# Patient Record
Sex: Female | Born: 1975
Health system: Southern US, Community
[De-identification: ages and names within clinical notes are randomized; demographics above are authoritative.]

## PROBLEM LIST (undated history)

## (undated) DIAGNOSIS — K648 Other hemorrhoids: Secondary | ICD-10-CM

## (undated) DIAGNOSIS — M545 Low back pain, unspecified: Secondary | ICD-10-CM

## (undated) DIAGNOSIS — D500 Iron deficiency anemia secondary to blood loss (chronic): Secondary | ICD-10-CM

## (undated) DIAGNOSIS — F988 Other specified behavioral and emotional disorders with onset usually occurring in childhood and adolescence: Secondary | ICD-10-CM

## (undated) DIAGNOSIS — E559 Vitamin D deficiency, unspecified: Secondary | ICD-10-CM

## (undated) DIAGNOSIS — Z8742 Personal history of other diseases of the female genital tract: Secondary | ICD-10-CM

## (undated) DIAGNOSIS — U071 COVID-19: Secondary | ICD-10-CM

## (undated) DIAGNOSIS — K5909 Other constipation: Secondary | ICD-10-CM

## (undated) DIAGNOSIS — B009 Herpesviral infection, unspecified: Secondary | ICD-10-CM

## (undated) DIAGNOSIS — N943 Premenstrual tension syndrome: Secondary | ICD-10-CM

## (undated) DIAGNOSIS — D649 Anemia, unspecified: Secondary | ICD-10-CM

## (undated) HISTORY — DX: Anemia, unspecified: D64.9

## (undated) HISTORY — DX: Low back pain, unspecified: M54.50

## (undated) HISTORY — DX: Vitamin D deficiency, unspecified: E55.9

## (undated) HISTORY — DX: Other constipation: K59.09

## (undated) HISTORY — DX: Other hemorrhoids: K64.8

## (undated) HISTORY — PX: TUBAL LIGATION: SHX77

## (undated) HISTORY — PX: ENDOMETRIAL BIOPSY: SHX622

## (undated) HISTORY — DX: Other specified behavioral and emotional disorders with onset usually occurring in childhood and adolescence: F98.8

## (undated) HISTORY — DX: Premenstrual tension syndrome: N94.3

## (undated) HISTORY — DX: Iron deficiency anemia secondary to blood loss (chronic): D50.0

## (undated) HISTORY — DX: COVID-19: U07.1

## (undated) HISTORY — DX: Personal history of other diseases of the female genital tract: Z87.42

## (undated) HISTORY — PX: OVARIAN CYST REMOVAL: SHX89

## (undated) HISTORY — DX: Herpesviral infection, unspecified: B00.9

## (undated) HISTORY — PX: NOVASURE ABLATION: SHX5394

## (undated) HISTORY — DX: Low back pain: M54.5

## (undated) HISTORY — PX: WISDOM TOOTH EXTRACTION: SHX21

---

## 2007-03-01 ENCOUNTER — Ambulatory Visit: Payer: Self-pay | Admitting: Gastroenterology

## 2007-07-10 ENCOUNTER — Emergency Department: Payer: Self-pay | Admitting: Emergency Medicine

## 2008-04-06 ENCOUNTER — Emergency Department: Payer: Self-pay | Admitting: Emergency Medicine

## 2009-04-18 ENCOUNTER — Ambulatory Visit: Payer: Self-pay | Admitting: Internal Medicine

## 2009-04-21 ENCOUNTER — Ambulatory Visit: Payer: Self-pay | Admitting: Family Medicine

## 2009-04-29 ENCOUNTER — Ambulatory Visit: Payer: Self-pay | Admitting: Internal Medicine

## 2009-05-19 ENCOUNTER — Ambulatory Visit: Payer: Self-pay | Admitting: Internal Medicine

## 2009-06-18 ENCOUNTER — Ambulatory Visit: Payer: Self-pay | Admitting: Internal Medicine

## 2009-07-19 ENCOUNTER — Ambulatory Visit: Payer: Self-pay | Admitting: Internal Medicine

## 2009-08-19 ENCOUNTER — Ambulatory Visit: Payer: Self-pay | Admitting: Internal Medicine

## 2009-08-27 ENCOUNTER — Ambulatory Visit: Payer: Self-pay | Admitting: Internal Medicine

## 2009-09-09 ENCOUNTER — Ambulatory Visit: Payer: Self-pay | Admitting: Family Medicine

## 2009-09-09 LAB — HM MAMMOGRAPHY: HM Mammogram: ABNORMAL

## 2009-09-17 ENCOUNTER — Ambulatory Visit: Payer: Self-pay | Admitting: Family Medicine

## 2009-09-18 ENCOUNTER — Ambulatory Visit: Payer: Self-pay | Admitting: Internal Medicine

## 2009-10-19 ENCOUNTER — Ambulatory Visit: Payer: Self-pay | Admitting: Internal Medicine

## 2009-11-17 ENCOUNTER — Ambulatory Visit: Payer: Self-pay | Admitting: Internal Medicine

## 2009-11-18 ENCOUNTER — Ambulatory Visit: Payer: Self-pay | Admitting: Internal Medicine

## 2009-12-19 ENCOUNTER — Ambulatory Visit: Payer: Self-pay | Admitting: Internal Medicine

## 2010-01-01 ENCOUNTER — Ambulatory Visit: Payer: Self-pay | Admitting: Internal Medicine

## 2010-01-19 ENCOUNTER — Ambulatory Visit: Payer: Self-pay | Admitting: Internal Medicine

## 2010-02-16 ENCOUNTER — Ambulatory Visit: Payer: Self-pay | Admitting: Internal Medicine

## 2010-02-26 ENCOUNTER — Ambulatory Visit: Payer: Self-pay | Admitting: Internal Medicine

## 2010-04-18 DIAGNOSIS — Z8742 Personal history of other diseases of the female genital tract: Secondary | ICD-10-CM

## 2010-04-18 HISTORY — DX: Personal history of other diseases of the female genital tract: Z87.42

## 2010-04-21 ENCOUNTER — Inpatient Hospital Stay: Payer: Self-pay | Admitting: Obstetrics and Gynecology

## 2010-07-19 ENCOUNTER — Ambulatory Visit: Payer: Self-pay | Admitting: Internal Medicine

## 2011-05-30 ENCOUNTER — Ambulatory Visit: Payer: Self-pay | Admitting: Family Medicine

## 2011-07-05 ENCOUNTER — Emergency Department: Payer: Self-pay | Admitting: Emergency Medicine

## 2011-11-15 ENCOUNTER — Encounter: Payer: Self-pay | Admitting: Obstetrics and Gynecology

## 2011-11-15 ENCOUNTER — Ambulatory Visit (INDEPENDENT_AMBULATORY_CARE_PROVIDER_SITE_OTHER): Payer: BC Managed Care – PPO | Admitting: Obstetrics and Gynecology

## 2011-11-15 VITALS — BP 117/74 | HR 71 | Ht 65.0 in | Wt 207.0 lb

## 2011-11-15 DIAGNOSIS — N83209 Unspecified ovarian cyst, unspecified side: Secondary | ICD-10-CM | POA: Insufficient documentation

## 2011-11-15 DIAGNOSIS — N390 Urinary tract infection, site not specified: Secondary | ICD-10-CM

## 2011-11-15 DIAGNOSIS — R300 Dysuria: Secondary | ICD-10-CM | POA: Insufficient documentation

## 2011-11-15 LAB — POCT URINALYSIS DIPSTICK
Glucose, UA: NEGATIVE
Nitrite, UA: NEGATIVE
Spec Grav, UA: 1.025
Urobilinogen, UA: 0.2

## 2011-11-15 NOTE — Progress Notes (Signed)
Addended by: Vinnie Langton C on: 11/15/2011 03:39 PM   Modules accepted: Orders

## 2011-11-15 NOTE — Patient Instructions (Signed)

## 2011-11-15 NOTE — Progress Notes (Signed)
35 yo presenting today for evaluation of a 1-week h/o urgency and difficulty voiding. Patient states that at time she also experiences dysuria. Patient also reports having a long standing h/o recurrent ovarian cyst for which she is medically treated with OCP. Patient reports starting her OCP in August but feels that every month she has a ruptured cyst as she has pelvic pain and difficulty ambulating as a result of her pain. Patient is scheduled to follow-up with her Ob/Gyn in a few months.  Urine culture collected. Patient will be contacted with any abnormal results. Patient to follow-up as planned with her Ob/Gyn for ovarian cyst

## 2012-02-01 ENCOUNTER — Ambulatory Visit: Payer: Self-pay | Admitting: Family Medicine

## 2013-05-19 LAB — HM PAP SMEAR: HM PAP: NORMAL

## 2013-10-02 DIAGNOSIS — M7700 Medial epicondylitis, unspecified elbow: Secondary | ICD-10-CM | POA: Insufficient documentation

## 2013-10-02 DIAGNOSIS — G5620 Lesion of ulnar nerve, unspecified upper limb: Secondary | ICD-10-CM | POA: Insufficient documentation

## 2013-10-02 DIAGNOSIS — M7540 Impingement syndrome of unspecified shoulder: Secondary | ICD-10-CM | POA: Insufficient documentation

## 2014-05-28 LAB — LIPID PANEL
Cholesterol: 169 mg/dL (ref 0–200)
HDL: 45 mg/dL (ref 35–70)
LDL Cholesterol: 115 mg/dL
TRIGLYCERIDES: 45 mg/dL (ref 40–160)

## 2014-10-20 ENCOUNTER — Encounter: Payer: Self-pay | Admitting: Obstetrics and Gynecology

## 2015-04-14 DIAGNOSIS — M5412 Radiculopathy, cervical region: Secondary | ICD-10-CM | POA: Insufficient documentation

## 2015-05-20 ENCOUNTER — Encounter: Payer: Self-pay | Admitting: Family Medicine

## 2015-05-20 ENCOUNTER — Ambulatory Visit (INDEPENDENT_AMBULATORY_CARE_PROVIDER_SITE_OTHER): Payer: 59 | Admitting: Family Medicine

## 2015-05-20 VITALS — BP 126/68 | HR 89 | Temp 98.2°F | Resp 18 | Ht 65.5 in | Wt 223.6 lb

## 2015-05-20 DIAGNOSIS — M792 Neuralgia and neuritis, unspecified: Secondary | ICD-10-CM

## 2015-05-20 DIAGNOSIS — J302 Other seasonal allergic rhinitis: Secondary | ICD-10-CM

## 2015-05-20 DIAGNOSIS — M5410 Radiculopathy, site unspecified: Secondary | ICD-10-CM

## 2015-05-20 NOTE — Patient Instructions (Signed)
Place neck pain patient instructions here. She has lost weight since last visit, and was advised to keep it up Reminded her of the importance of Place neck pain patient instructions here. continue to eat when taking prednisone to avoid GI bleed

## 2015-05-20 NOTE — Progress Notes (Signed)
Name: Emily Eaton   MRN: [id number]    DOB: [birth year]   Date:05/20/2015       Progress Note  Subjective  Chief Complaint  Chief Complaint  Patient presents with  . Arm Pain    Right    HPI  She was seen in our office on April 26th, 2016 for right arm radiculitis, that could be from neck or shoulder. She was given prednisone taper and was kept off work for the past week, she still has mild paresthesia on tip of 2nd through 4th fingers, but no longer has pain and is ready to go back to work    Past Medical History  Diagnosis Date  . Anemia   . History of ovarian cyst 04/2010    History  Substance Use Topics  . Smoking status: Never Smoker   . Smokeless tobacco: Not on file  . Alcohol Use: 1.2 oz/week    2 Standard drinks or equivalent per week     Comment: occassion     Current outpatient prescriptions:  .  fluticasone (FLONASE) 50 MCG/ACT nasal spray, Place 2 sprays into the nose., Disp: , Rfl:  .  pseudoephedrine (SUDAFED) 30 MG tablet, Take 2 capsules by mouth as needed., Disp: , Rfl:  .  cholecalciferol (VITAMIN D) 1000 UNITS tablet, Take 1,000 Units by mouth daily.  , Disp: , Rfl:  .  norethindrone-ethinyl estradiol-iron (ESTROSTEP FE,TILIA FE,TRI-LEGEST FE) 1-20/1-30/1-35 MG-MCG tablet, Take 1 tablet by mouth daily.  , Disp: , Rfl:   Allergies  Allergen Reactions  . Codeine Itching and Rash    ROS  Ten systems reviewed and is negative except as mentioned in HPI   Objective  Filed Vitals:   05/20/15 1013  BP: 126/68  Pulse: 89  Temp: 98.2 F (36.8 C)  TempSrc: Oral  Resp: 18  Height: 5' 5.5" (1.664 m)  Weight: 223 lb 9.6 oz (101.424 kg)  SpO2: 99%     Physical Exam  Constitutional: Patient appears well-developed and well-nourished. No distress.  HENT: Head: Normocephalic and atraumatic.  Nose: Nose normal. Mouth/Throat: Oropharynx is clear and moist. No oropharyngeal exudate.  Eyes: Conjunctivae and EOM are normal. Pupils are equal, round, and  reactive to light. No scleral icterus.  Neck: Normal range of motion. Neck supple. No point tenderness during exam No JVD present. No thyromegaly present.  Cardiovascular: Normal rate, regular rhythm and normal heart sounds.  No murmur heard. No BLE edema. Pulmonary/Chest: Effort normal and breath sounds normal. No respiratory distress. Musculoskeletal: Normal range of motion, no joint effusions. No gross deformities Neurological: he is alert and oriented to person, place, and time.  Coordination, balance, strength, speech and gait are normal.  Skin: Skin is warm and dry. No rash noted. No erythema.  Psychiatric: Patient has a normal mood and affect. behavior is normal. Judgment and thought content normal.    Assessment & Plan  1. Radiculitis She is still having some paresthesia, has not finished the prednisone taper, no longer has shoulder or neck pain, and is ready to go back to work. Explained that if returns we will check MRI neck, may also benefit from PT. FMLA forms filled out during today's visit

## 2015-07-08 ENCOUNTER — Telehealth: Payer: Self-pay | Admitting: Family Medicine

## 2015-07-08 NOTE — Telephone Encounter (Signed)
Please advise 

## 2015-07-08 NOTE — Telephone Encounter (Signed)
Pt would like to know if you could give her prednisone for her hand again. She states the pain is back in her hand. Pt has an appt on 07/28/15. [place]

## 2015-07-08 NOTE — Telephone Encounter (Signed)
She needs to be seen.

## 2015-07-09 ENCOUNTER — Other Ambulatory Visit: Payer: Self-pay | Admitting: Family Medicine

## 2015-07-09 DIAGNOSIS — M5412 Radiculopathy, cervical region: Secondary | ICD-10-CM

## 2015-07-09 NOTE — Telephone Encounter (Signed)
Pt states she will wait for her appt in August.

## 2015-07-25 ENCOUNTER — Encounter: Payer: Self-pay | Admitting: Family Medicine

## 2015-07-25 DIAGNOSIS — K5909 Other constipation: Secondary | ICD-10-CM | POA: Insufficient documentation

## 2015-07-25 DIAGNOSIS — B009 Herpesviral infection, unspecified: Secondary | ICD-10-CM | POA: Insufficient documentation

## 2015-07-25 DIAGNOSIS — E669 Obesity, unspecified: Secondary | ICD-10-CM | POA: Insufficient documentation

## 2015-07-25 DIAGNOSIS — F900 Attention-deficit hyperactivity disorder, predominantly inattentive type: Secondary | ICD-10-CM | POA: Insufficient documentation

## 2015-07-25 DIAGNOSIS — D509 Iron deficiency anemia, unspecified: Secondary | ICD-10-CM | POA: Insufficient documentation

## 2015-07-25 DIAGNOSIS — K648 Other hemorrhoids: Secondary | ICD-10-CM | POA: Insufficient documentation

## 2015-07-25 DIAGNOSIS — M79676 Pain in unspecified toe(s): Secondary | ICD-10-CM | POA: Insufficient documentation

## 2015-07-25 DIAGNOSIS — E559 Vitamin D deficiency, unspecified: Secondary | ICD-10-CM | POA: Insufficient documentation

## 2015-07-25 DIAGNOSIS — K5904 Chronic idiopathic constipation: Secondary | ICD-10-CM | POA: Insufficient documentation

## 2015-07-28 ENCOUNTER — Ambulatory Visit (INDEPENDENT_AMBULATORY_CARE_PROVIDER_SITE_OTHER): Payer: 59 | Admitting: Family Medicine

## 2015-07-28 ENCOUNTER — Encounter: Payer: Self-pay | Admitting: Family Medicine

## 2015-07-28 VITALS — BP 116/68 | HR 76 | Temp 98.5°F | Resp 18 | Ht 66.0 in | Wt 226.4 lb

## 2015-07-28 DIAGNOSIS — M25641 Stiffness of right hand, not elsewhere classified: Secondary | ICD-10-CM | POA: Insufficient documentation

## 2015-07-28 DIAGNOSIS — Z0000 Encounter for general adult medical examination without abnormal findings: Secondary | ICD-10-CM | POA: Diagnosis not present

## 2015-07-28 DIAGNOSIS — Z01419 Encounter for gynecological examination (general) (routine) without abnormal findings: Secondary | ICD-10-CM

## 2015-07-28 DIAGNOSIS — Z13220 Encounter for screening for lipoid disorders: Secondary | ICD-10-CM

## 2015-07-28 DIAGNOSIS — Z1239 Encounter for other screening for malignant neoplasm of breast: Secondary | ICD-10-CM | POA: Diagnosis not present

## 2015-07-28 DIAGNOSIS — E559 Vitamin D deficiency, unspecified: Secondary | ICD-10-CM | POA: Diagnosis not present

## 2015-07-28 DIAGNOSIS — Z79899 Other long term (current) drug therapy: Secondary | ICD-10-CM

## 2015-07-28 DIAGNOSIS — Z862 Personal history of diseases of the blood and blood-forming organs and certain disorders involving the immune mechanism: Secondary | ICD-10-CM | POA: Diagnosis not present

## 2015-07-28 DIAGNOSIS — Z124 Encounter for screening for malignant neoplasm of cervix: Secondary | ICD-10-CM | POA: Diagnosis not present

## 2015-07-28 NOTE — Progress Notes (Signed)
Name: Emily Eaton   MRN: [id number]    DOB: 1976-02-25   Date:07/28/2015       Progress Note  Subjective  Chief Complaint  Chief Complaint  Patient presents with  . Annual Exam    HPI  CPE: she is feeling well, except for right hand stiffness and pain, sometimes tingling and numbness, symptoms started in May 2016 and was seen here and treated for cervical radiculitis with prednisone taper , she felt better, but symptoms returned about 3 weeks ago, but different now, with more arm symptoms, neck is no longer hurting, pain is more in her hand with pain, stiffness.   Patient Active Problem List   Diagnosis Date Noted  . Stiffness of right hand joint 07/28/2015  . ADD (attention deficit hyperactivity disorder, inattentive type) 07/25/2015  . Pain in toe 07/25/2015  . Chronic constipation 07/25/2015  . Herpes 07/25/2015  . Hemorrhoids, internal 07/25/2015  . Obesity (BMI 30-39.9) 07/25/2015  . Vitamin D deficiency 07/25/2015  . Radiculitis of right cervical region 04/14/2015  . Epicondylitis elbow, medial 10/02/2013  . Entrapment of ulnar nerve 10/02/2013  . Impingement syndrome of shoulder 10/02/2013  . Ovarian cyst 11/15/2011    Past Surgical History  Procedure Laterality Date  . Ovarian cyst removal      left  . Tubal ligation    . Novasure ablation    . Wisdom tooth extraction      x 1  . Endometrial biopsy      Family History  Problem Relation Age of Onset  . Diabetes Mother   . Depression Mother   . Hypertension Mother   . Kidney disease Mother   . Hyperlipidemia Mother   . Hypertension Father   . Diabetes Father   . Liver disease Father   . Asthma Brother   . Cancer Maternal Uncle     prostate  . Asthma Son     History   Social History  . Marital Status: Married    Spouse Name: Dorthula Rue  . Number of Children: 2  . Years of Education: 14   Occupational History  . 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 Smoking status: Never Smoker   . Smokeless tobacco: Never Used  . Alcohol Use: 1.2 oz/week    2 Standard drinks or equivalent per week     Comment: occassion  . Drug Use: No  . Sexual Activity:    Partners: Male    Birth Control/ Protection: Surgical     Comment: Tubaligation   Other Topics Concern  . Not on file   Social History Narrative     Current outpatient prescriptions:  .  Naproxen Sodium (ALEVE) 220 MG CAPS, Take 1 capsule by mouth 2 (two) times daily as needed., Disp: , Rfl:  .  pseudoephedrine (SUDAFED) 30 MG tablet, Take 2 capsules by mouth as needed., Disp: , Rfl:   Allergies  Allergen Reactions  . Codeine Itching and Rash     ROS  Constitutional: Negative for fever or weight change.  Respiratory: Negative for cough and shortness of breath.   Cardiovascular: Negative for chest pain or palpitations.  Gastrointestinal: Negative for abdominal pain, no bowel changes.  Musculoskeletal: Negative for gait problem positive for joint swelling- right hand  Skin: Negative for rash.  Neurological: Negative for dizziness or headache.  No other specific complaints in a complete review of systems (except as listed  in HPI above).  Objective  Filed Vitals:   07/28/15 1142  BP: 116/68  Pulse: 76  Temp: 98.5 F (36.9 C)  TempSrc: Oral  Resp: 18  Height: '5\' 6"'  (1.676 m)  Weight: 226 lb 6.4 oz (102.694 kg)  SpO2: 96%    Body mass index is 36.56 kg/(m^2).  Physical Exam  Constitutional: Patient appears well-developed and well-nourished. No distress. Obese HENT: Head: Normocephalic and atraumatic. Ears: B TMs ok, no erythema or effusion; Nose: Nose normal. Mouth/Throat: Oropharynx is clear and moist. No oropharyngeal exudate.  Eyes: Conjunctivae and EOM are normal. Pupils are equal, round, and reactive to light. No scleral icterus.  Neck: Normal range of motion. Neck supple. No JVD present. No thyromegaly present.  Cardiovascular: Normal rate, regular  rhythm and normal heart sounds.  No murmur heard. No BLE edema. Pulmonary/Chest: Effort normal and breath sounds normal. No respiratory distress. Abdominal: Soft. Bowel sounds are normal, no distension. There is no tenderness. no masses Breast: no lumps or masses, no nipple discharge or rashes FEMALE GENITALIA:  External genitalia normal External urethra normal Vaginal vault normal without discharge or lesions Cervix normal without discharge or lesions Bimanual exam normal without masses RECTAL: not done Musculoskeletal: pain with movement of right hand, mild effusion, no increase in warmth or erythema.  No gross deformities Neurological: he is alert and oriented to person, place, and time. No cranial nerve deficit. Coordination, balance, strength, speech and gait are normal.  Skin: Skin is warm and dry. No rash noted. No erythema.  Psychiatric: Patient has a normal mood and affect. behavior is normal. Judgment and thought content normal.  PHQ2/9: Depression screen [facility] 2/9 07/28/2015 05/20/2015  Decreased Interest 0 0  Down, Depressed, Hopeless 0 0  PHQ - 2 Score 0 0     Fall Risk: Fall Risk  07/28/2015 05/20/2015  Falls in the past year? No No      Assessment & Plan  1. Well woman exam Discussed importance of 150 minutes of physical activity weekly, eat two servings of fish weekly, eat one serving of tree nuts ( cashews, pistachios, pecans, almonds.Marland Kitchen) every other day, eat 6 servings of fruit/vegetables daily and drink plenty of water and avoid sweet beverages.   2. Cervical cancer screening  - Cytology - PAP  3. Breast cancer screening  - MM Digital Screening; Future  4. Stiffness of right hand joint We will check sed rate, but if no improvement will need NCS of right upper extremity and possible referral to Ortho since history of right shoulder impingement symptoms - Sed Rate (ESR)  5. Lipid screening  - Lipid Profile  6. Vitamin D deficiency  - Vitamin D (25  hydroxy)  7. Long-term use of high-risk medication  - Comprehensive Metabolic Panel (CMET)  8. History of iron deficiency anemia  - Hematocrit

## 2015-07-29 ENCOUNTER — Other Ambulatory Visit: Payer: Self-pay | Admitting: Family Medicine

## 2015-07-29 ENCOUNTER — Telehealth: Payer: Self-pay

## 2015-07-29 LAB — LIPID PANEL
Chol/HDL Ratio: 4.2 ratio units (ref 0.0–4.4)
Cholesterol, Total: 173 mg/dL (ref 100–199)
HDL: 41 mg/dL (ref >39)
LDL Calculated: 121 mg/dL — ABNORMAL HIGH (ref 0–99)
Triglycerides: 53 mg/dL (ref 0–149)
VLDL Cholesterol Cal: 11 mg/dL (ref 5–40)

## 2015-07-29 LAB — COMPREHENSIVE METABOLIC PANEL
ALT: 6 IU/L (ref 0–32)
AST: 8 IU/L (ref 0–40)
Albumin/Globulin Ratio: 1.2 (ref 1.1–2.5)
Albumin: 3.8 g/dL (ref 3.5–5.5)
Alkaline Phosphatase: 62 IU/L (ref 39–117)
BUN/Creatinine Ratio: 12 (ref 9–23)
BUN: 8 mg/dL (ref 6–24)
Bilirubin Total: 0.3 mg/dL (ref 0.0–1.2)
CALCIUM: 8.9 mg/dL (ref 8.7–10.2)
CHLORIDE: 104 mmol/L (ref 97–108)
CO2: 22 mmol/L (ref 18–29)
Creatinine, Ser: 0.69 mg/dL (ref 0.57–1.00)
GFR calc Af Amer: 125 mL/min/{1.73_m2} (ref >59)
GFR calc non Af Amer: 108 mL/min/{1.73_m2} (ref >59)
GLOBULIN, TOTAL: 3.3 g/dL (ref 1.5–4.5)
Glucose: 84 mg/dL (ref 65–99)
Potassium: 4 mmol/L (ref 3.5–5.2)
SODIUM: 141 mmol/L (ref 134–144)
Total Protein: 7.1 g/dL (ref 6.0–8.5)

## 2015-07-29 LAB — HEMATOCRIT: HEMATOCRIT: 36.4 % (ref 34.0–46.6)

## 2015-07-29 LAB — SEDIMENTATION RATE: SED RATE: 24 mm/h (ref 0–32)

## 2015-07-29 LAB — VITAMIN D 25 HYDROXY (VIT D DEFICIENCY, FRACTURES): Vit D, 25-Hydroxy: 11.6 ng/mL — ABNORMAL LOW (ref 30.0–100.0)

## 2015-07-29 MED ORDER — VITAMIN D (ERGOCALCIFEROL) 1.25 MG (50000 UNIT) PO CAPS: 50000.0000 [IU] | ORAL_CAPSULE | ORAL | Status: DC

## 2015-07-29 NOTE — Telephone Encounter (Signed)
Left vm for patient to return my call regarding lab results.

## 2015-07-29 NOTE — Telephone Encounter (Signed)
-----   Message from Steele Sizer, MD sent at 07/29/2015 12:26 PM EDT ----- Lipid panel is fine Sugar , kidney and liver functions are within normal limits No anemia Normal sed rate Vitamin D is low, I will send prescription vitamin D to  pharmacy and once finished she/he needs to take otc vitamin D 2000 units daily Please notify patient, thank you

## 2015-07-30 NOTE — Progress Notes (Signed)
Patient notified

## 2015-09-08 ENCOUNTER — Ambulatory Visit: Payer: 59 | Admitting: Family Medicine

## 2015-12-11 ENCOUNTER — Ambulatory Visit (INDEPENDENT_AMBULATORY_CARE_PROVIDER_SITE_OTHER): Payer: 59 | Admitting: Family Medicine

## 2015-12-11 ENCOUNTER — Encounter: Payer: Self-pay | Admitting: Family Medicine

## 2015-12-11 VITALS — BP 132/88 | HR 102 | Temp 98.5°F | Resp 18 | Ht 66.0 in | Wt 220.4 lb

## 2015-12-11 DIAGNOSIS — Z113 Encounter for screening for infections with a predominantly sexual mode of transmission: Secondary | ICD-10-CM

## 2015-12-11 DIAGNOSIS — Z23 Encounter for immunization: Secondary | ICD-10-CM

## 2015-12-11 DIAGNOSIS — J069 Acute upper respiratory infection, unspecified: Secondary | ICD-10-CM | POA: Diagnosis not present

## 2015-12-11 DIAGNOSIS — R350 Frequency of micturition: Secondary | ICD-10-CM

## 2015-12-11 DIAGNOSIS — A599 Trichomoniasis, unspecified: Secondary | ICD-10-CM

## 2015-12-11 DIAGNOSIS — N760 Acute vaginitis: Secondary | ICD-10-CM | POA: Diagnosis not present

## 2015-12-11 LAB — POCT WET PREP (WET MOUNT)

## 2015-12-11 LAB — POCT URINALYSIS DIPSTICK
Bilirubin, UA: NEGATIVE
Blood, UA: NEGATIVE
GLUCOSE UA: NEGATIVE
Ketones, UA: NEGATIVE
LEUKOCYTES UA: NEGATIVE
Nitrite, UA: NEGATIVE
SPEC GRAV UA: 1.025
UROBILINOGEN UA: NEGATIVE
pH, UA: 5

## 2015-12-11 MED ORDER — METRONIDAZOLE 500 MG PO TABS: 2000.0000 mg | ORAL_TABLET | Freq: Once | ORAL | Status: DC

## 2015-12-11 MED ORDER — HYDROCOD POLST-CPM POLST ER 10-8 MG/5ML PO SUER: 5.0000 mL | Freq: Two times a day (BID) | ORAL | Status: DC | PRN

## 2015-12-11 MED ORDER — FLUTICASONE PROPIONATE 50 MCG/ACT NA SUSP: 2.0000 | Freq: Every day | NASAL | Status: DC

## 2015-12-11 MED ORDER — DOXYCYCLINE HYCLATE 100 MG PO TABS: 100.0000 mg | ORAL_TABLET | Freq: Two times a day (BID) | ORAL | Status: DC

## 2015-12-11 MED ORDER — FLUTICASONE FUROATE-VILANTEROL 100-25 MCG/INH IN AEPB: 1.0000 | INHALATION_SPRAY | Freq: Every day | RESPIRATORY_TRACT | Status: DC

## 2015-12-11 NOTE — Progress Notes (Signed)
Name: Emily Eaton   MRN: [id number]    DOB: 1976/05/04   Date:12/11/2015       Progress Note  Subjective  Chief Complaint  Chief Complaint  Patient presents with  . URI    Onset last Wednesday, Cough-Green Phlegm, Nasal Congestion, Epistaxis, Sinus Pain/Pressure and symptoms getting worst. Has taken Sudafed, Mucinex, Flonase, Ibuprofen, cough drops with no relief.  . Vaginal Discharge    Onset-1 week, yellow to brown discharge, vaginal itch, odor, urinary frequency.     HPI  URI: symptoms started 9 days ago. Initially with sore throat, rhinorrhea, nasal congestion, followed by facial pressure, cough that is productive in am's, hoarseness, chest congestion, no fever, no chills, normal appetite. Most of the symptoms with otc medication but the cough is terrible.   Vaginal Discharge: started about one week ago, described as yellowish/brown, vaginal itching, odor, she also has dysuria ( past month with nocturia ), she does not trust her husband. Worried about STI  Pruritus: bilateral inner thighs, no rashes, going on for a few weeks now.   Patient Active Problem List   Diagnosis Date Noted  . Stiffness of right hand joint 07/28/2015  . ADD (attention deficit hyperactivity disorder, inattentive type) 07/25/2015  . Pain in toe 07/25/2015  . Chronic constipation 07/25/2015  . Herpes 07/25/2015  . Hemorrhoids, internal 07/25/2015  . Obesity (BMI 30-39.9) 07/25/2015  . Vitamin D deficiency 07/25/2015  . Radiculitis of right cervical region 04/14/2015  . Epicondylitis elbow, medial 10/02/2013  . Entrapment of ulnar nerve 10/02/2013  . Impingement syndrome of shoulder 10/02/2013  . Ovarian cyst 11/15/2011    Past Surgical History  Procedure Laterality Date  . Ovarian cyst removal      left  . Tubal ligation    . Novasure ablation    . Wisdom tooth extraction      x 1  . Endometrial biopsy      Family History  Problem Relation Age of Onset  . Diabetes Mother   .  Depression Mother   . Hypertension Mother   . Kidney disease Mother   . Hyperlipidemia Mother   . Hypertension Father   . Diabetes Father   . Liver disease Father   . Asthma Brother   . Cancer Maternal Uncle     prostate  . Asthma Son     Social History   Social History  . Marital Status: Married    Spouse Name: Dorthula Rue  . Number of Children: 2  . 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 Smoking status: Never Smoker   . Smokeless tobacco: Never Used  . Alcohol Use: 1.2 oz/week    2 Standard drinks or equivalent per week     Comment: occassionally  . Drug Use: No  . Sexual Activity:    Partners: Male    Birth Control/ Protection: Surgical     Comment: Tubaligation   Other Topics Concern  . Not on file   Social History Narrative     Current outpatient prescriptions:  .  cholecalciferol (VITAMIN D) 1000 UNITS tablet, Take 2,000 Units by mouth once., Disp: , Rfl:  .  pseudoephedrine (SUDAFED) 30 MG tablet, Take 2 capsules by mouth as needed., Disp: , Rfl:  .  chlorpheniramine-HYDROcodone (TUSSIONEX PENNKINETIC ER) 10-8 MG/5ML SUER, Take 5 mLs by mouth every 12 (twelve) hours as needed  for cough., Disp: 140 mL, Rfl: 0 .  fluticasone (FLONASE) 50 MCG/ACT nasal spray, Place 2 sprays into both nostrils daily., Disp: 16 g, Rfl: 0  Allergies  Allergen Reactions  . Codeine Itching and Rash     ROS  Ten systems reviewed and is negative except as mentioned in HPI   Objective  Filed Vitals:   12/11/15 1511  BP: 132/88  Pulse: 102  Temp: 98.5 F (36.9 C)  TempSrc: Oral  Resp: 18  Height: 5\' 6"  (1.676 m)  Weight: 220 lb 6.4 oz (99.973 kg)  SpO2: 99%    Body mass index is 35.59 kg/(m^2).  Physical Exam  Constitutional: Patient appears well-developed and well-nourished. Obese No distress.  HEENT: head atraumatic, normocephalic, pupils equal and reactive to  light, ears TM normal, no tenderness during palpation of sinus, neck supple, throat within normal limits, except for mild erythema, no postnasal drainage Cardiovascular: Normal rate, regular rhythm and normal heart sounds.  No murmur heard. No BLE edema. Pulmonary/Chest: Effort normal and breath sounds normal. No respiratory distress. Abdominal: Soft.  There is no tenderness. Psychiatric: Patient has a normal mood and affect. behavior is normal. Judgment and thought content normal. GU: mild vaginal discharge watery , cervix has some erythema, cervical os normal, bimanual exam, mild discomfort left lower quadrant but she wants to hold off on [place] Skin: inner thighs shows a rash like keratosis pilaris - likely from rubbing - advise body glide and weight loss   PHQ2/9: Depression screen [facility] 2/9 12/11/2015 07/28/2015 05/20/2015  Decreased Interest 0 0 0  Down, Depressed, Hopeless 0 0 0  PHQ - 2 Score 0 0 0    Fall Risk: Fall Risk  12/11/2015 07/28/2015 05/20/2015  Falls in the past year? Yes No No  Number falls in past yr: 1 - -  Injury with Fall? Yes - -    Functional Status Survey: Is the patient deaf or have difficulty hearing?: No Does the patient have difficulty seeing, even when wearing glasses/contacts?: No Does the patient have difficulty concentrating, remembering, or making decisions?: No Does the patient have difficulty walking or climbing stairs?: No Does the patient have difficulty dressing or bathing?: No Does the patient have difficulty doing errands alone such as visiting a doctor's office or shopping?: No   Assessment & Plan  1. Urinary frequency  - POCT Urinalysis Dipstick  2. Needs flu shot  - Flu Vaccine QUAD 36+ mos PF IM (Fluarix & Fluzone Quad PF) -refused  3. Upper respiratory infection  We will add Doxy, since she has UTI symptoms, possible chlamydia and URI  - chlorpheniramine-HYDROcodone (TUSSIONEX PENNKINETIC ER) 10-8 MG/5ML SUER; Take 5 mLs by mouth every  12 (twelve) hours as needed for cough.  Dispense: 140 mL; Refill: 0 - fluticasone (FLONASE) 50 MCG/ACT nasal spray; Place 2 sprays into both nostrils daily.  Dispense: 16 g; Refill: 0  4. Vaginitis  - POCT Wet Prep [employer] [place])  5. Routine screening for STI (sexually transmitted infection)  - RPR - HIV antibody - GC/chlamydia probe amp, urine   6. Trichomonal infection  Discussed transmission and that spouse also needs to be treated - metroNIDAZOLE (FLAGYL) 500 MG tablet; Take 4 tablets (2,000 mg total) by mouth once.  Dispense: 4 tablet; Refill: 0

## 2015-12-12 LAB — HIV ANTIBODY (ROUTINE TESTING W REFLEX): HIV Screen 4th Generation wRfx: NONREACTIVE

## 2015-12-12 LAB — RPR: RPR: NONREACTIVE

## 2015-12-15 ENCOUNTER — Other Ambulatory Visit: Payer: Self-pay | Admitting: Family Medicine

## 2015-12-17 ENCOUNTER — Telehealth: Payer: Self-pay

## 2015-12-17 LAB — PLEASE NOTE

## 2015-12-18 LAB — URINE CULTURE

## 2015-12-18 LAB — GC/CHLAMYDIA PROBE AMP
Chlamydia trachomatis, NAA: NEGATIVE
NEISSERIA GONORRHOEAE BY PCR: NEGATIVE

## 2015-12-18 LAB — SPECIMEN STATUS REPORT

## 2015-12-23 NOTE — Telephone Encounter (Signed)
Patient urinary symptoms have gone away with first antibiotic and does not want the new one.

## 2016-07-07 ENCOUNTER — Other Ambulatory Visit: Payer: Self-pay | Admitting: Family Medicine

## 2016-07-07 ENCOUNTER — Encounter: Payer: Self-pay | Admitting: Family Medicine

## 2016-07-07 ENCOUNTER — Ambulatory Visit
Admission: RE | Admit: 2016-07-07 | Discharge: 2016-07-07 | Disposition: A | Discharge status: Home / Self Care | Payer: 59 | Source: Ambulatory Visit | Attending: Family Medicine | Admitting: Family Medicine

## 2016-07-07 ENCOUNTER — Ambulatory Visit (INDEPENDENT_AMBULATORY_CARE_PROVIDER_SITE_OTHER): Payer: 59 | Admitting: Family Medicine

## 2016-07-07 VITALS — BP 114/82 | HR 82 | Temp 98.3°F | Resp 16 | Ht 66.0 in | Wt 218.6 lb

## 2016-07-07 DIAGNOSIS — D259 Leiomyoma of uterus, unspecified: Secondary | ICD-10-CM | POA: Insufficient documentation

## 2016-07-07 DIAGNOSIS — R110 Nausea: Secondary | ICD-10-CM | POA: Diagnosis not present

## 2016-07-07 DIAGNOSIS — R1031 Right lower quadrant pain: Secondary | ICD-10-CM | POA: Insufficient documentation

## 2016-07-07 DIAGNOSIS — R350 Frequency of micturition: Secondary | ICD-10-CM

## 2016-07-07 LAB — CBC WITH DIFFERENTIAL/PLATELET
BASOS ABS: 0 {cells}/uL (ref 0–200)
Basophils Relative: 0 %
EOS ABS: 72 {cells}/uL (ref 15–500)
Eosinophils Relative: 2 %
HEMATOCRIT: 37.6 % (ref 35.0–45.0)
HEMOGLOBIN: 11.8 g/dL (ref 11.7–15.5)
LYMPHS ABS: 1512 {cells}/uL (ref 850–3900)
Lymphocytes Relative: 42 %
MCH: 24.8 pg — AB (ref 27.0–33.0)
MCHC: 31.4 g/dL — ABNORMAL LOW (ref 32.0–36.0)
MCV: 79 fL — AB (ref 80.0–100.0)
MONO ABS: 432 {cells}/uL (ref 200–950)
MPV: 8.7 fL (ref 7.5–12.5)
Monocytes Relative: 12 %
NEUTROS ABS: 1584 {cells}/uL (ref 1500–7800)
NEUTROS PCT: 44 %
Platelets: 263 10*3/uL (ref 140–400)
RBC: 4.76 MIL/uL (ref 3.80–5.10)
RDW: 14 % (ref 11.0–15.0)
WBC: 3.6 10*3/uL — ABNORMAL LOW (ref 3.8–10.8)

## 2016-07-07 LAB — COMPLETE METABOLIC PANEL WITH GFR
ALBUMIN: 3.7 g/dL (ref 3.6–5.1)
ALK PHOS: 49 U/L (ref 33–115)
ALT: 10 U/L (ref 6–29)
AST: 15 U/L (ref 10–30)
BILIRUBIN TOTAL: 0.3 mg/dL (ref 0.2–1.2)
BUN: 8 mg/dL (ref 7–25)
CALCIUM: 8.9 mg/dL (ref 8.6–10.2)
CO2: 25 mmol/L (ref 20–31)
Chloride: 105 mmol/L (ref 98–110)
Creat: 0.69 mg/dL (ref 0.50–1.10)
GLUCOSE: 90 mg/dL (ref 65–99)
Potassium: 4.4 mmol/L (ref 3.5–5.3)
SODIUM: 136 mmol/L (ref 135–146)
TOTAL PROTEIN: 7 g/dL (ref 6.1–8.1)

## 2016-07-07 MED ORDER — POLYETHYLENE GLYCOL 3350 17 GM/SCOOP PO POWD
17.0000 g | Freq: Two times a day (BID) | ORAL | Status: DC | PRN
Start: 2016-07-07 — End: 2017-10-26

## 2016-07-07 MED ORDER — IOPAMIDOL (ISOVUE-300) INJECTION 61%
100.0000 mL | Freq: Once | INTRAVENOUS | Status: AC | PRN
  Administered 2016-07-07: 100 mL via INTRAVENOUS

## 2016-07-07 NOTE — Progress Notes (Signed)
Name: Emily Eaton   MRN: [id number]    DOB: 1976/06/18   Date:07/07/2016       Progress Note  Subjective  Chief Complaint  Chief Complaint  Patient presents with  . Abdominal Pain    patient presents with right lower quadrant pain that radiates to the lower back. she has pressure in her flank area. patient went to the [facility] walk and was treated for BV. she completed the meds (Flagyl) today. patient stated that her sx persisits. hx of ovarian cyst.    HPI  Right lower quadrant pain: she states symptoms started one day after her LMP 06/24/2016. Started with RLQ pain, that radiated to supra-pubic area and goes to her lower back. She states pain is constant. She states the pain is described as dull/pressure, 7/10, aggravated by sitting still and better when moving around, pain also worse after she voids. . She has also noticed that now the pain radiates to left lower quadrant occasionally , associated with urinary frequency  ( every hour ) no urgency. She has nocturia. She was seen at Urgent Care on 07/12 , urine culture negative, she was treated for BV but symptoms improved slightly with metronidazole but still have not resolved. She does not have a history of kidney stones, she has a history of ovarian cysts. She denies blood in stools or change in bowel movements. She feels nauseated but no vomiting.   Patient Active Problem List   Diagnosis Date Noted  . Stiffness of right hand joint 07/28/2015  . ADD (attention deficit hyperactivity disorder, inattentive type) 07/25/2015  . Pain in toe 07/25/2015  . Chronic constipation 07/25/2015  . Herpes 07/25/2015  . Hemorrhoids, internal 07/25/2015  . Obesity (BMI 30-39.9) 07/25/2015  . Vitamin D deficiency 07/25/2015  . Radiculitis of right cervical region 04/14/2015  . Epicondylitis elbow, medial 10/02/2013  . Entrapment of ulnar nerve 10/02/2013  . Impingement syndrome of shoulder 10/02/2013  . Ovarian cyst 11/15/2011    Past Surgical  History  Procedure Laterality Date  . Ovarian cyst removal      left  . Tubal ligation    . Novasure ablation    . Wisdom tooth extraction      x 1  . Endometrial biopsy      Family History  Problem Relation Age of Onset  . Diabetes Mother   . Depression Mother   . Hypertension Mother   . Kidney disease Mother   . Hyperlipidemia Mother   . Hypertension Father   . Diabetes Father   . Liver disease Father   . Asthma Brother   . Cancer Maternal Uncle     prostate  . Asthma Son     Social History   Social History  . Marital Status: Married    Spouse Name: Dorthula Rue  . Number of Children: 2  . 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 Smoking status: Never Smoker   . Smokeless tobacco: Never Used  . Alcohol Use: 1.2 oz/week    2 Standard drinks or equivalent per week     Comment: occassionally  . Drug Use: No  . Sexual Activity:    Partners: Male    Birth Control/ Protection: Surgical     Comment: Tubaligation   Other Topics Concern  . Not on file   Social History Narrative  Current outpatient prescriptions:  .  cholecalciferol (VITAMIN D) 1000 UNITS tablet, Take 2,000 Units by mouth once., Disp: , Rfl:   Allergies  Allergen Reactions  . Codeine Itching and Rash     ROS  Ten systems reviewed and is negative except as mentioned in HPI   Objective  Filed Vitals:   07/07/16 0924  BP: 114/82  Pulse: 82  Temp: 98.3 F (36.8 C)  TempSrc: Oral  Resp: 16  Height: 5\' 6"  (1.676 m)  Weight: 218 lb 9.6 oz (99.156 kg)  SpO2: 98%    Body mass index is 35.3 kg/(m^2).  Physical Exam  Constitutional: Patient appears well-developed and well-nourished. Obese  No distress.  HEENT: head atraumatic, normocephalic, pupils equal and reactive to light,  neck supple, throat within normal limits Cardiovascular: Normal rate, regular rhythm and normal heart sounds.   No murmur heard. No BLE edema. Pulmonary/Chest: Effort normal and breath sounds normal. No respiratory distress. Abdominal: Soft.  There is no tenderness. Psychiatric: Patient has a normal mood and affect. behavior is normal. Judgment and thought content normal.  PHQ2/9: Depression screen [facility] 2/9 07/07/2016 12/11/2015 07/28/2015 05/20/2015  Decreased Interest 0 0 0 0  Down, Depressed, Hopeless 0 0 0 0  PHQ - 2 Score 0 0 0 0    Fall Risk: Fall Risk  07/07/2016 12/11/2015 07/28/2015 05/20/2015  Falls in the past year? No Yes No No  Number falls in past yr: - 1 - -  Injury with Fall? - Yes - -    Functional Status Survey: Is the patient deaf or have difficulty hearing?: No Does the patient have difficulty seeing, even when wearing glasses/contacts?: No Does the patient have difficulty concentrating, remembering, or making decisions?: No Does the patient have difficulty walking or climbing stairs?: No Does the patient have difficulty dressing or bathing?: No Does the patient have difficulty doing errands alone such as visiting a doctor's office or shopping?: No    Assessment & Plan  1. Right lower quadrant pain  - CT Abdomen Pelvis W Contrast; Future - CBC with Differential/Platelet - COMPLETE METABOLIC PANEL WITH GFR  2. Nausea  - CT Abdomen Pelvis W Contrast; Future - CBC with Differential/Platelet - COMPLETE METABOLIC PANEL WITH GFR  3. Urinary frequency  - CT Abdomen Pelvis W Contrast; Future - CBC with Differential/Platelet - COMPLETE METABOLIC PANEL WITH GFR

## 2016-07-08 ENCOUNTER — Telehealth: Payer: Self-pay | Admitting: Family Medicine

## 2016-07-08 NOTE — Telephone Encounter (Signed)
Until she has good bowel movements

## 2016-07-08 NOTE — Telephone Encounter (Signed)
Patient was informed of Dr. Ancil Boozer message and said ok.

## 2016-07-08 NOTE — Telephone Encounter (Signed)
Patient needs clarification as to how long she needs to be on the liquid diet Dr. Ancil Boozer put her on.

## 2016-07-11 LAB — FERRITIN: Ferritin: 21 ng/mL (ref 10–232)

## 2017-06-07 ENCOUNTER — Ambulatory Visit (INDEPENDENT_AMBULATORY_CARE_PROVIDER_SITE_OTHER): Payer: 59 | Admitting: Family Medicine

## 2017-06-07 ENCOUNTER — Encounter: Payer: Self-pay | Admitting: Family Medicine

## 2017-06-07 ENCOUNTER — Other Ambulatory Visit: Payer: Self-pay | Admitting: Family Medicine

## 2017-06-07 VITALS — BP 128/78 | HR 87 | Temp 98.1°F | Resp 18 | Ht 64.0 in | Wt 220.0 lb

## 2017-06-07 DIAGNOSIS — Z13220 Encounter for screening for lipoid disorders: Secondary | ICD-10-CM | POA: Diagnosis not present

## 2017-06-07 DIAGNOSIS — E669 Obesity, unspecified: Secondary | ICD-10-CM

## 2017-06-07 DIAGNOSIS — Z01419 Encounter for gynecological examination (general) (routine) without abnormal findings: Secondary | ICD-10-CM

## 2017-06-07 DIAGNOSIS — Z113 Encounter for screening for infections with a predominantly sexual mode of transmission: Secondary | ICD-10-CM | POA: Diagnosis not present

## 2017-06-07 DIAGNOSIS — F321 Major depressive disorder, single episode, moderate: Secondary | ICD-10-CM | POA: Diagnosis not present

## 2017-06-07 DIAGNOSIS — Z0001 Encounter for general adult medical examination with abnormal findings: Secondary | ICD-10-CM

## 2017-06-07 DIAGNOSIS — Z862 Personal history of diseases of the blood and blood-forming organs and certain disorders involving the immune mechanism: Secondary | ICD-10-CM

## 2017-06-07 DIAGNOSIS — Z1239 Encounter for other screening for malignant neoplasm of breast: Secondary | ICD-10-CM

## 2017-06-07 DIAGNOSIS — Z23 Encounter for immunization: Secondary | ICD-10-CM

## 2017-06-07 DIAGNOSIS — E559 Vitamin D deficiency, unspecified: Secondary | ICD-10-CM | POA: Diagnosis not present

## 2017-06-07 DIAGNOSIS — Z630 Problems in relationship with spouse or partner: Secondary | ICD-10-CM | POA: Diagnosis not present

## 2017-06-07 DIAGNOSIS — Z124 Encounter for screening for malignant neoplasm of cervix: Secondary | ICD-10-CM

## 2017-06-07 LAB — CBC WITH DIFFERENTIAL/PLATELET
BASOS PCT: 0 %
Basophils Absolute: 0 cells/uL (ref 0–200)
EOS ABS: 56 {cells}/uL (ref 15–500)
EOS PCT: 2 %
HCT: 39.6 % (ref 35.0–45.0)
Hemoglobin: 12.6 g/dL (ref 11.7–15.5)
LYMPHS PCT: 46 %
Lymphs Abs: 1288 cells/uL (ref 850–3900)
MCH: 25 pg — ABNORMAL LOW (ref 27.0–33.0)
MCHC: 31.8 g/dL — ABNORMAL LOW (ref 32.0–36.0)
MCV: 78.6 fL — AB (ref 80.0–100.0)
MONOS PCT: 8 %
MPV: 8.9 fL (ref 7.5–12.5)
Monocytes Absolute: 224 cells/uL (ref 200–950)
NEUTROS ABS: 1232 {cells}/uL — AB (ref 1500–7800)
Neutrophils Relative %: 44 %
Platelets: 244 10*3/uL (ref 140–400)
RBC: 5.04 MIL/uL (ref 3.80–5.10)
RDW: 14.4 % (ref 11.0–15.0)
WBC: 2.8 10*3/uL — AB (ref 3.8–10.8)

## 2017-06-07 LAB — TSH: TSH: 1.08 mIU/L

## 2017-06-07 MED ORDER — DULOXETINE HCL 30 MG PO CPEP: 30.0000 mg | ORAL_CAPSULE | Freq: Every day | ORAL | 0 refills | Status: DC

## 2017-06-07 NOTE — Progress Notes (Signed)
Name: Emily Eaton   MRN: [id number]    DOB: 1976-12-12   Date:06/07/2017       Progress Note  Subjective  Chief Complaint  Chief Complaint  Patient presents with  . Annual Exam    HPI  Well Woman : she has noticed that her past 2 cycles are lighter, no vaginal discharge, no breast lumps. No change in bowel movements, still has constipation - bowel movements every other day with Miralax , advised to take it daily  Major depression: she has been feeling down for months, mother has dementia, and marriage is getting worse, symptoms started about 6 months ago, she is having trouble staying asleep, crying spells, feeling tired, she is still able to go to work, but when home she does not do anything else. She is willing to start medication  Patient Active Problem List   Diagnosis Date Noted  . Stiffness of right hand joint 07/28/2015  . ADD (attention deficit hyperactivity disorder, inattentive type) 07/25/2015  . Pain in toe 07/25/2015  . Chronic constipation 07/25/2015  . Herpes 07/25/2015  . Hemorrhoids, internal 07/25/2015  . Obesity (BMI 30-39.9) 07/25/2015  . Vitamin D deficiency 07/25/2015  . Radiculitis of right cervical region 04/14/2015  . Epicondylitis elbow, medial 10/02/2013  . Entrapment of ulnar nerve 10/02/2013  . Impingement syndrome of shoulder 10/02/2013  . Ovarian cyst 11/15/2011    Past Surgical History:  Procedure Laterality Date  . ENDOMETRIAL BIOPSY    . NOVASURE ABLATION    . OVARIAN CYST REMOVAL     left  . TUBAL LIGATION    . WISDOM TOOTH EXTRACTION     x 1    Family History  Problem Relation Age of Onset  . Diabetes Mother   . Depression Mother   . Hypertension Mother   . Kidney disease Mother   . Hyperlipidemia Mother   . Dementia Mother   . Hypertension Father   . Diabetes Father   . Liver disease Father   . Asthma Brother   . Cancer Maternal Uncle        prostate  . Asthma Son     Social History   Social History  . Marital  status: Married    Spouse name: Dorthula Rue  . Number of children: 2  . Years of education: 14   Occupational History  . Teacher     [employer]  . Sales Associate [employer]   Social History Main Topics  . Smoking status: Never Smoker  . Smokeless tobacco: Never Used  . Alcohol use 1.2 oz/week    2 Standard drinks or equivalent per week     Comment: occassionally  . Drug use: No  . Sexual activity: Yes    Partners: Male    Birth control/ protection: Surgical     Comment: Tubaligation   Other Topics Concern  . Not on file   Social History Narrative   Having marital problems, thinking about separation        Current Outpatient Prescriptions:  .  cholecalciferol (VITAMIN D) 1000 UNITS tablet, Take 2,000 Units by mouth once., Disp: , Rfl:  .  DULoxetine (CYMBALTA) 30 MG capsule, Take 1-2 capsules (30-60 mg total) by mouth daily., Disp: 60 capsule, Rfl: 0 .  polyethylene glycol powder (GLYCOLAX/MIRALAX) powder, Take 17 g by mouth 2 (two) times daily as needed., Disp: 3350 g, Rfl: 1  Allergies  Allergen Reactions  . Codeine Itching and Rash     ROS  Constitutional:  Negative for fever or weight change.  Respiratory: Negative for cough and shortness of breath.   Cardiovascular: Negative for chest pain or palpitations.  Gastrointestinal: Negative for abdominal pain, no bowel changes.  Musculoskeletal: Negative for gait problem or joint swelling.  Skin: Negative for rash.  Neurological: Negative for dizziness or headache.  No other specific complaints in a complete review of systems (except as listed in HPI above).  Objective  Vitals:   06/07/17 1111  BP: 128/78  Pulse: 87  Resp: 18  Temp: 98.1 F (36.7 C)  SpO2: 98%  Weight: 220 lb (99.8 kg)  Height: 5\' 4"  (1.626 m)    Body mass index is 37.76 kg/m.  Physical Exam  Constitutional: Patient appears well-developed and well-nourished. Obese  No distress.  HENT: Head: Normocephalic and atraumatic. Ears: B TMs  ok, no erythema or effusion; Nose: Nose normal. Mouth/Throat: Oropharynx is clear and moist. No oropharyngeal exudate.  Eyes: Conjunctivae and EOM are normal. Pupils are equal, round, and reactive to light. No scleral icterus.  Neck: Normal range of motion. Neck supple. No JVD present. No thyromegaly present.  Cardiovascular: Normal rate, regular rhythm and normal heart sounds.  No murmur heard. No BLE edema. Pulmonary/Chest: Effort normal and breath sounds normal. No respiratory distress. Abdominal: Soft. Bowel sounds are normal, no distension. There is no tenderness. no masses Breast: no lumps or masses, no nipple discharge or rashes FEMALE GENITALIA:  External genitalia normal External urethra normal Vaginal vault normal without discharge or lesions Cervix normal without discharge or lesions Bimanual exam normal without masses RECTAL: no rectal masses or hemorrhoids  Musculoskeletal: Normal range of motion, no joint effusions. No gross deformities Neurological: he is alert and oriented to person, place, and time. No cranial nerve deficit. Coordination, balance, strength, speech and gait are normal.  Skin: Skin is warm and dry. No rash noted. No erythema.  Psychiatric: Patient has a depressed  mood and affect. behavior is normal. Judgment and thought content normal.  PHQ2/9: Depression screen [facility] 2/9 06/07/2017 07/07/2016 12/11/2015 07/28/2015 05/20/2015  Decreased Interest 2 0 0 0 0  Down, Depressed, Hopeless 2 0 0 0 0  PHQ - 2 Score 4 0 0 0 0  Altered sleeping 3 - - - -  Tired, decreased energy 3 - - - -  Change in appetite 3 - - - -  Feeling bad or failure about yourself  1 - - - -  Trouble concentrating 3 - - - -  Moving slowly or fidgety/restless 0 - - - -  Suicidal thoughts 0 - - - -  PHQ-9 Score 17 - - - -     Fall Risk: Fall Risk  07/07/2016 12/11/2015 07/28/2015 05/20/2015  Falls in the past year? No Yes No No  Number falls in past yr: - 1 - -  Injury with Fall? - Yes - -    Current Exercise Habits: The patient does not participate in regular exercise at present Exercise limited by: None identified    Assessment & Plan  1. Well woman exam  Discussed importance of 150 minutes of physical activity weekly, eat two servings of fish weekly, eat one serving of tree nuts ( cashews, pistachios, pecans, almonds.Marland Kitchen) every other day, eat 6 servings of fruit/vegetables daily and drink plenty of water and avoid sweet beverages.  - COMPLETE METABOLIC PANEL WITH GFR  2. Cervical cancer screening  - Pap IG, CT/NG NAA, and HPV (high risk)  3. Routine screening for STI (sexually transmitted infection)  -  HIV antibody - RPR  4. Breast cancer screening  - mammogram   5. Lipid screening  - Lipid panel  6. Vitamin D deficiency  - VITAMIN D 25 Hydroxy (Vit-D Deficiency, Fractures)  7. History of iron deficiency anemia  - CBC with Differential/Platelet  8. Obesity (BMI 30-39.9)  Discussed with the patient the risk posed by an increased BMI. Discussed importance of portion control, calorie counting and at least 150 minutes of physical activity weekly. Avoid sweet beverages and drink more water. Eat at least 6 servings of fruit and vegetables daily  - Hemoglobin A1c - Insulin, fasting  9. Current moderate episode of major depressive disorder without prior episode (Ruby)  Discussed possible side effects, never on medications before but mother took Cymbalta in the past - DULoxetine (CYMBALTA) 30 MG capsule; Take 1-2 capsules (30-60 mg total) by mouth daily.  Dispense: 60 capsule; Refill: 0 -TSH  10. Marital problems  Thinking about separation   11. Need for Tdap vaccination  - Tdap vaccine greater than or equal to 7yo IM

## 2017-06-07 NOTE — Patient Instructions (Signed)
Preventive Care 40-64 Years, Female Preventive care refers to lifestyle choices and visits with your health care provider that can promote health and wellness. What does preventive care include?  A yearly physical exam. This is also called an annual well check.  Dental exams once or twice a year.  Routine eye exams. Ask your health care provider how often you should have your eyes checked.  Personal lifestyle choices, including: ? Daily care of your teeth and gums. ? Regular physical activity. ? Eating a healthy diet. ? Avoiding tobacco and drug use. ? Limiting alcohol use. ? Practicing safe sex. ? Taking low-dose aspirin daily starting at age 58. ? Taking vitamin and mineral supplements as recommended by your health care provider. What happens during an annual well check? The services and screenings done by your health care provider during your annual well check will depend on your age, overall health, lifestyle risk factors, and family history of disease. Counseling Your health care provider may ask you questions about your:  Alcohol use.  Tobacco use.  Drug use.  Emotional well-being.  Home and relationship well-being.  Sexual activity.  Eating habits.  Work and work Statistician.  Method of birth control.  Menstrual cycle.  Pregnancy history.  Screening You may have the following tests or measurements:  Height, weight, and BMI.  Blood pressure.  Lipid and cholesterol levels. These may be checked every 5 years, or more frequently if you are over 81 years old.  Skin check.  Lung cancer screening. You may have this screening every year starting at age 78 if you have a 30-pack-year history of smoking and currently smoke or have quit within the past 15 years.  Fecal occult blood test (FOBT) of the stool. You may have this test every year starting at age 65.  Flexible sigmoidoscopy or colonoscopy. You may have a sigmoidoscopy every 5 years or a colonoscopy  every 10 years starting at age 30.  Hepatitis C blood test.  Hepatitis B blood test.  Sexually transmitted disease (STD) testing.  Diabetes screening. This is done by checking your blood sugar (glucose) after you have not eaten for a while (fasting). You may have this done every 1-3 years.  Mammogram. This may be done every 1-2 years. Talk to your health care provider about when you should start having regular mammograms. This may depend on whether you have a family history of breast cancer.  BRCA-related cancer screening. This may be done if you have a family history of breast, ovarian, tubal, or peritoneal cancers.  Pelvic exam and Pap test. This may be done every 3 years starting at age 80. Starting at age 36, this may be done every 5 years if you have a Pap test in combination with an HPV test.  Bone density scan. This is done to screen for osteoporosis. You may have this scan if you are at high risk for osteoporosis.  Discuss your test results, treatment options, and if necessary, the need for more tests with your health care provider. Vaccines Your health care provider may recommend certain vaccines, such as:  Influenza vaccine. This is recommended every year.  Tetanus, diphtheria, and acellular pertussis (Tdap, Td) vaccine. You may need a Td booster every 10 years.  Varicella vaccine. You may need this if you have not been vaccinated.  Zoster vaccine. You may need this after age 5.  Measles, mumps, and rubella (MMR) vaccine. You may need at least one dose of MMR if you were born in  1957 or later. You may also need a second dose.  Pneumococcal 13-valent conjugate (PCV13) vaccine. You may need this if you have certain conditions and were not previously vaccinated.  Pneumococcal polysaccharide (PPSV23) vaccine. You may need one or two doses if you smoke cigarettes or if you have certain conditions.  Meningococcal vaccine. You may need this if you have certain  conditions.  Hepatitis A vaccine. You may need this if you have certain conditions or if you travel or work in places where you may be exposed to hepatitis A.  Hepatitis B vaccine. You may need this if you have certain conditions or if you travel or work in places where you may be exposed to hepatitis B.  Haemophilus influenzae type b (Hib) vaccine. You may need this if you have certain conditions.  Talk to your health care provider about which screenings and vaccines you need and how often you need them. This information is not intended to replace advice given to you by your health care provider. Make sure you discuss any questions you have with your health care provider. Document Released: 01/01/2016 Document Revised: 08/24/2016 Document Reviewed: 10/06/2015 Elsevier Interactive Patient Education  2017 [employer].

## 2017-06-08 ENCOUNTER — Other Ambulatory Visit: Payer: Self-pay

## 2017-06-08 DIAGNOSIS — F321 Major depressive disorder, single episode, moderate: Secondary | ICD-10-CM

## 2017-06-08 LAB — COMPLETE METABOLIC PANEL WITH GFR
ALBUMIN: 3.7 g/dL (ref 3.6–5.1)
ALT: 6 U/L (ref 6–29)
AST: 10 U/L (ref 10–30)
Alkaline Phosphatase: 56 U/L (ref 33–115)
BILIRUBIN TOTAL: 0.2 mg/dL (ref 0.2–1.2)
BUN: 6 mg/dL — AB (ref 7–25)
CO2: 22 mmol/L (ref 20–31)
Calcium: 8.8 mg/dL (ref 8.6–10.2)
Chloride: 108 mmol/L (ref 98–110)
Creat: 0.73 mg/dL (ref 0.50–1.10)
GFR, Est African American: >89 mL/min (ref >=60)
GLUCOSE: 85 mg/dL (ref 65–99)
POTASSIUM: 4.2 mmol/L (ref 3.5–5.3)
Sodium: 136 mmol/L (ref 135–146)
TOTAL PROTEIN: 7 g/dL (ref 6.1–8.1)

## 2017-06-08 LAB — VITAMIN D 25 HYDROXY (VIT D DEFICIENCY, FRACTURES): VIT D 25 HYDROXY: 19 ng/mL — AB (ref 30–100)

## 2017-06-08 LAB — HEMOGLOBIN A1C
HEMOGLOBIN A1C: 5.7 % — AB (ref <5.7)
MEAN PLASMA GLUCOSE: 117 mg/dL

## 2017-06-08 LAB — HIV ANTIBODY (ROUTINE TESTING W REFLEX): HIV: NONREACTIVE

## 2017-06-08 LAB — INSULIN, FASTING: Insulin fasting, serum: 10.5 u[IU]/mL (ref 2.0–19.6)

## 2017-06-08 LAB — LIPID PANEL
CHOLESTEROL: 171 mg/dL (ref <200)
HDL: 41 mg/dL — ABNORMAL LOW (ref >50)
LDL Cholesterol: 121 mg/dL — ABNORMAL HIGH (ref <100)
Total CHOL/HDL Ratio: 4.2 Ratio (ref <5.0)
Triglycerides: 44 mg/dL (ref <150)
VLDL: 9 mg/dL (ref <30)

## 2017-06-08 NOTE — Telephone Encounter (Signed)
Patient requesting refill of Duloxetine to [facility].

## 2017-06-09 LAB — RPR

## 2017-06-11 MED ORDER — DULOXETINE HCL 30 MG PO CPEP: 60.0000 mg | ORAL_CAPSULE | Freq: Every day | ORAL | 0 refills | Status: DC

## 2017-06-13 LAB — PAP IG, CT-NG NAA, HPV HIGH-RISK
CHLAMYDIA PROBE AMP: NOT DETECTED
GC PROBE AMP: NOT DETECTED
HPV DNA HIGH RISK: NOT DETECTED

## 2017-07-07 ENCOUNTER — Ambulatory Visit: Payer: 59 | Admitting: Family Medicine

## 2017-08-15 ENCOUNTER — Ambulatory Visit
Admission: RE | Admit: 2017-08-15 | Discharge: 2017-08-15 | Disposition: A | Discharge status: Home / Self Care | Payer: 59 | Source: Ambulatory Visit | Attending: Family Medicine | Admitting: Family Medicine

## 2017-08-15 DIAGNOSIS — Z1231 Encounter for screening mammogram for malignant neoplasm of breast: Secondary | ICD-10-CM | POA: Insufficient documentation

## 2017-08-15 DIAGNOSIS — Z1239 Encounter for other screening for malignant neoplasm of breast: Secondary | ICD-10-CM

## 2017-10-11 ENCOUNTER — Ambulatory Visit (INDEPENDENT_AMBULATORY_CARE_PROVIDER_SITE_OTHER): Payer: 59 | Admitting: Family Medicine

## 2017-10-11 ENCOUNTER — Encounter: Payer: Self-pay | Admitting: Family Medicine

## 2017-10-11 VITALS — BP 118/72 | HR 90 | Temp 98.1°F | Resp 16 | Ht 64.0 in | Wt 222.4 lb

## 2017-10-11 DIAGNOSIS — Z800 Family history of malignant neoplasm of digestive organs: Secondary | ICD-10-CM

## 2017-10-11 DIAGNOSIS — R300 Dysuria: Secondary | ICD-10-CM | POA: Diagnosis not present

## 2017-10-11 DIAGNOSIS — R151 Fecal smearing: Secondary | ICD-10-CM

## 2017-10-11 DIAGNOSIS — F320 Major depressive disorder, single episode, mild: Secondary | ICD-10-CM | POA: Diagnosis not present

## 2017-10-11 DIAGNOSIS — N898 Other specified noninflammatory disorders of vagina: Secondary | ICD-10-CM

## 2017-10-11 DIAGNOSIS — Z23 Encounter for immunization: Secondary | ICD-10-CM | POA: Diagnosis not present

## 2017-10-11 LAB — POCT URINALYSIS DIPSTICK
Bilirubin, UA: NEGATIVE
Blood, UA: NEGATIVE
Glucose, UA: NEGATIVE
Ketones, UA: NEGATIVE
Nitrite, UA: NEGATIVE
Spec Grav, UA: 1.015
Urobilinogen, UA: 0.2 U/dL
pH, UA: 6

## 2017-10-11 MED ORDER — FLUCONAZOLE 150 MG PO TABS: 150.0000 mg | ORAL_TABLET | ORAL | 0 refills | Status: DC

## 2017-10-11 MED ORDER — CITALOPRAM HYDROBROMIDE 20 MG PO TABS: 20.0000 mg | ORAL_TABLET | Freq: Every day | ORAL | 2 refills | Status: DC

## 2017-10-11 MED ORDER — METRONIDAZOLE 500 MG PO TABS: 2000.0000 mg | ORAL_TABLET | Freq: Once | ORAL | 0 refills | Status: AC

## 2017-10-11 NOTE — Progress Notes (Signed)
Name: Emily Eaton   MRN: [id number]    DOB: 01/29/1976   Date:10/11/2017       Progress Note  Subjective  Chief Complaint  Chief Complaint  Patient presents with  . Vaginal Discharge    Onset-3 days, irritation, discharge-light yellow, dysuria, frequency, burning. Has tried otc Monistat with no relief.     HPI  Vaginal symptoms: she had intercourse with her husband a few days ago and condoms got stuck, it caused irritation to have it removed. She noticed some vaginal irritation and itchy discharge , tried Monistat which caused worsening of symptoms ( burning sensation) but doing better today. No new partners, intercourse with husband, denies urinary frequency, she has noticed dysuria , no hematuria. No fever or chills.   Soiling: she has Bristol scale 3-4, but states that over the past 3 months she has noticed that after bowel movements she has some soiling, no blood in stools, states that she had 3 maternal uncles that were diagnosed with colon cancer.   Depression: doing better at home, but worried about parents, father in rehab for alcoholism, and mother has cognitive dysfunction. Never filled Cymbalta because of cost, she states she is willing to try something on the $4 WM list. She has been feeling overwhelmed, having crying spells. No suicidal thoughts or ideation.    Patient Active Problem List   Diagnosis Date Noted  . Stiffness of right hand joint 07/28/2015  . ADD (attention deficit hyperactivity disorder, inattentive type) 07/25/2015  . Pain in toe 07/25/2015  . Chronic constipation 07/25/2015  . Herpes 07/25/2015  . Hemorrhoids, internal 07/25/2015  . Obesity (BMI 30-39.9) 07/25/2015  . Vitamin D deficiency 07/25/2015  . Radiculitis of right cervical region 04/14/2015  . Epicondylitis elbow, medial 10/02/2013  . Entrapment of ulnar nerve 10/02/2013  . Impingement syndrome of shoulder 10/02/2013  . Ovarian cyst 11/15/2011    Social History  Substance Use Topics   . Smoking status: Never Smoker  . Smokeless tobacco: Never Used  . Alcohol use 1.2 oz/week    2 Standard drinks or equivalent per week     Comment: occassionally     Current Outpatient Prescriptions:  .  cholecalciferol (VITAMIN D) 1000 UNITS tablet, Take 2,000 Units by mouth once., Disp: , Rfl:  .  cyclobenzaprine (FLEXERIL) 5 MG tablet, Take by mouth., Disp: , Rfl:  .  etodolac (LODINE) 500 MG tablet, Take by mouth., Disp: , Rfl:  .  fluticasone (FLONASE) 50 MCG/ACT nasal spray, Place into the nose., Disp: , Rfl:  .  polyethylene glycol powder (GLYCOLAX/MIRALAX) powder, Take 17 g by mouth 2 (two) times daily as needed., Disp: 3350 g, Rfl: 1  Allergies  Allergen Reactions  . Codeine Itching and Rash    ROS  Constitutional: Negative for fever or weight change.  Respiratory: Negative for cough and shortness of breath.   Cardiovascular: Negative for chest pain or palpitations.  Gastrointestinal: Negative for abdominal pain, positive for bowel changes.  Musculoskeletal: Negative for gait problem or joint swelling.  Skin: Negative for rash.  Neurological: Negative for dizziness or headache.  No other specific complaints in a complete review of systems (except as listed in HPI above).  Objective  Vitals:   10/11/17 1446  BP: 118/72  Pulse: 90  Resp: 16  Temp: 98.1 F (36.7 C)  TempSrc: Oral  SpO2: 97%  Weight: 222 lb 6.4 oz (100.9 kg)  Height: 5\' 4"  (1.626 m)    Body mass index is 38.17 kg/m.  Physical Exam  Constitutional: Patient appears well-developed and well-nourished. Obese  No distress.  HEENT: head atraumatic, normocephalic, pupils equal and reactive to light,  neck supple, throat within normal limits Cardiovascular: Normal rate, regular rhythm and normal heart sounds.  No murmur heard. No BLE edema. Pulmonary/Chest: Effort normal and breath sounds normal. No respiratory distress. Abdominal: Soft.  There is no tenderness. GYN: some irritation/redness on  vaginal introitus, mild discharge, mild lower abdominal discomfort, no masses on bimanual exam Psychiatric: Patient has a normal mood and affect. behavior is normal. Judgment and thought content normal.  Recent Results (from the past 2160 hour(s))  POCT urinalysis dipstick     Status: Abnormal   Collection Time: 10/11/17  2:56 PM  Result Value Ref Range   Color, UA yellow    Clarity, UA clear    Glucose, UA neg    Bilirubin, UA neg    Ketones, UA neg    Spec Grav, UA 1.015 1.010 - 1.025   Blood, UA neg    pH, UA 6.0 5.0 - 8.0   Protein, UA trace    Urobilinogen, UA 0.2 0.2 or 1.0 E.U./dL   Nitrite, UA neg    Leukocytes, UA Small (1+) (A) Negative     Assessment & Plan  1. Dysuria  - POCT urinalysis dipstick  2. Need for immunization against influenza  - Flu Vaccine QUAD 6+ mos PF IM (Fluarix Quad PF)  3. Vaginal discharge  - POCT urinalysis dipstick - WET PREP BY MOLECULAR PROBE - GC Probe amplification, urine - fluconazole (DIFLUCAN) 150 MG tablet; Take 1 tablet (150 mg total) by mouth every other day.  Dispense: 3 tablet; Refill: 0 - metroNIDAZOLE (FLAGYL) 500 MG tablet; Take 4 tablets (2,000 mg total) by mouth once.  Dispense: 4 tablet; Refill: 0  4. Soiling  - Ambulatory referral to Gastroenterology  5. Mild major depression (HCC)  - citalopram (CELEXA) 20 MG tablet; Take 1 tablet (20 mg total) by mouth daily.  Dispense: 30 tablet; Refill: 2  6. Family history of colon cancer  - Ambulatory referral to Gastroenterology

## 2017-10-12 LAB — WET PREP BY MOLECULAR PROBE
Candida species: NOT DETECTED
MICRO NUMBER: 81192610
SPECIMEN QUALITY:: ADEQUATE
Trichomonas vaginosis: NOT DETECTED

## 2017-10-12 LAB — URINE CULTURE
MICRO NUMBER:: 81192044
SPECIMEN QUALITY:: ADEQUATE

## 2017-10-25 ENCOUNTER — Encounter: Payer: Self-pay | Admitting: Gastroenterology

## 2017-10-25 ENCOUNTER — Ambulatory Visit: Payer: 59 | Admitting: Gastroenterology

## 2017-10-25 VITALS — BP 119/73 | HR 81 | Temp 97.9°F | Ht 64.0 in | Wt 223.4 lb

## 2017-10-25 DIAGNOSIS — K5900 Constipation, unspecified: Secondary | ICD-10-CM

## 2017-10-25 DIAGNOSIS — R150 Incomplete defecation: Secondary | ICD-10-CM

## 2017-10-25 NOTE — Progress Notes (Signed)
Emily Antigua, MD [address] [place], [place], [place], [address] [address], [place], [place], [place], [address] Phone: [phone]  Fax: [phone]  Consultation  Referring Provider:     Steele Sizer, MD Primary Care Physician:  Steele Sizer, MD Primary Gastroenterologist:  Dr. Bonna Gains         Reason for Referral:  Incontinence     Date of Consultation:  10/25/2017         HPI:   Emily Eaton is a 41 y.o. female presents with chronic history of constipation taking miralax intermittently who presents for evaluation of incontinence. She reports having a BM in the morning and she does not feel evacuated after it and will feel a discharge for 30 mins after consisting of mucus and some liquid stool. She does have a sensation when she needs to have a BM and does not have a BM on herself. No blood in stool or weight loss. Colonoscopy in 2008 for BRBPR only showed 72mm nodule in rectosigmoid that was biopsied (result no available). No immediate family members with colon cancer. Has had 2 vaginal deliveries that were uncomplicated.   Past Medical History:  Diagnosis Date  . Acute low back pain   . ADD (attention deficit disorder) without hyperactivity   . Anemia   . Chronic constipation   . Herpes simplex without complication   . History of ovarian cyst 04/2010  . Internal hemorrhoids   . Iron deficiency anemia due to chronic blood loss   . PMS (premenstrual syndrome)   . Vitamin D deficiency     Past Surgical History:  Procedure Laterality Date  . ENDOMETRIAL BIOPSY    . NOVASURE ABLATION    . OVARIAN CYST REMOVAL     left  . TUBAL LIGATION    . WISDOM TOOTH EXTRACTION     x 1    Prior to Admission medications   Medication Sig Start Date End Date Taking? Authorizing Provider  cholecalciferol (VITAMIN D) 1000 UNITS tablet Take 2,000 Units by mouth once.   Yes [provider]  citalopram (CELEXA) 20 MG tablet Take 1 tablet (20 mg total) by  mouth daily. 10/11/17  Yes Sowles, Drue Stager, MD  cyclobenzaprine (FLEXERIL) 5 MG tablet Take by mouth. 11/02/16  Yes [provider]  etodolac (LODINE) 500 MG tablet Take by mouth. 11/02/16  Yes [provider]  fluconazole (DIFLUCAN) 150 MG tablet Take 1 tablet (150 mg total) by mouth every other day. 10/11/17  Yes Sowles, Drue Stager, MD  fluticasone (FLONASE) 50 MCG/ACT nasal spray Place into the nose.   Yes [provider]  polyethylene glycol powder (GLYCOLAX/MIRALAX) powder Take 17 g by mouth 2 (two) times daily as needed. 07/07/16  Yes Steele Sizer, MD    Family History  Problem Relation Age of Onset  . Diabetes Mother   . Depression Mother   . Hypertension Mother   . Kidney disease Mother   . Hyperlipidemia Mother   . Dementia Mother   . Hypertension Father   . Diabetes Father   . Liver disease Father   . Asthma Brother   . Cancer Maternal Uncle        prostate  . Asthma Son      Social History   Tobacco Use  . Smoking status: Never Smoker  . Smokeless tobacco: Never Used  Substance Use Topics  . Alcohol use: Yes    Alcohol/week: 1.2 oz    Types: 2 Standard drinks or equivalent  per week    Comment: occassionally  . Drug use: No    Allergies as of 10/25/2017 - Review Complete 10/25/2017  Allergen Reaction Noted  . Codeine Itching and Rash 11/15/2011    Review of Systems:    All systems reviewed and negative except where noted in HPI.   Physical Exam:  Vital signs in last 24 hours: @VSRANGES @   General:   Pleasant, cooperative in NAD Head:  Normocephalic and atraumatic. Eyes:   No icterus.   Conjunctiva pink. PERRLA. Ears:  Normal auditory acuity. Neck:  Supple; no masses or thyroidomegaly Lungs: Respirations even and unlabored. Lungs clear to auscultation bilaterally.   No wheezes, crackles, or rhonchi.  Heart:  Regular rate and rhythm;  Without murmur, clicks, rubs or gallops Abdomen:  Soft, nondistended, nontender. Normal bowel  sounds. No appreciable masses or hepatomegaly.  No rebound or guarding.  Rectal:  Normal rectal tone, normal perianal sensation, no hemorrhoids, solid stool in rectum Msk:  Symmetrical without gross deformities.  Strength 5/5 UE and LE Extremities:  Without edema, cyanosis or clubbing. Neurologic:  Alert and oriented x3;  grossly normal neurologically. Skin:  Intact without significant lesions or rashes. Cervical Nodes:  No significant cervical adenopathy. Psych:  Alert and cooperative. Normal affect.  LAB RESULTS: No results for input(s): WBC, HGB, HCT, PLT in the last 72 hours. BMET No results for input(s): NA, K, CL, CO2, GLUCOSE, BUN, CREATININE, CALCIUM in the last 72 hours. LFT No results for input(s): PROT, ALBUMIN, AST, ALT, ALKPHOS, BILITOT, BILIDIR, IBILI in the last 72 hours. PT/INR No results for input(s): LABPROT, INR in the last 72 hours.  Recent labs reviewed in chart  STUDIES: No results found.  CT scan last yr showed stool burden in colon   Impression / Plan:   Emily Eaton is a 41 y.o. y/o female with chronic constipation with post bowel movement discharge and feeling of incomplete evacuation  Her symptoms are most likely due to overflow incontinence given good rectal tone and good rectal squeeze and appropriate rectal relaxation with bear down maneuver (this is all during rectal exam today and definitely not as accurate as a anorectal manometry) We will try Miralax daily with goal of titrating it to 1-2 large soft BM a day No alarm symptoms to indicate endoscopy at this time. Will try to obtain biopsy results from last colonoscopy to determine any presence of adenomas at that time and determine repeat interval Will monitor for improvement in symptoms  Thank you for involving me in the care of this patient.    Virgel Manifold, MD  10/25/2017, 3:56 PM

## 2017-10-26 ENCOUNTER — Other Ambulatory Visit: Payer: Self-pay | Admitting: Family Medicine

## 2017-11-06 DIAGNOSIS — H1032 Unspecified acute conjunctivitis, left eye: Secondary | ICD-10-CM | POA: Diagnosis not present

## 2017-11-24 ENCOUNTER — Ambulatory Visit: Payer: 59 | Admitting: Family Medicine

## 2018-02-08 ENCOUNTER — Telehealth: Payer: Self-pay | Admitting: Gastroenterology

## 2018-02-08 NOTE — Telephone Encounter (Signed)
Spoke to patient for recall appt. Patient does not want to schedule right now.

## 2018-02-21 ENCOUNTER — Encounter: Payer: Self-pay | Admitting: Family Medicine

## 2018-02-21 ENCOUNTER — Ambulatory Visit: Payer: 59 | Admitting: Family Medicine

## 2018-02-21 VITALS — BP 122/84 | HR 86 | Temp 98.2°F | Resp 18 | Ht 64.0 in | Wt 224.8 lb

## 2018-02-21 DIAGNOSIS — N83209 Unspecified ovarian cyst, unspecified side: Secondary | ICD-10-CM

## 2018-02-21 DIAGNOSIS — R300 Dysuria: Secondary | ICD-10-CM | POA: Diagnosis not present

## 2018-02-21 DIAGNOSIS — N898 Other specified noninflammatory disorders of vagina: Secondary | ICD-10-CM | POA: Diagnosis not present

## 2018-02-21 DIAGNOSIS — R109 Unspecified abdominal pain: Secondary | ICD-10-CM

## 2018-02-21 LAB — POCT URINALYSIS DIPSTICK
Bilirubin, UA: NEGATIVE
Blood, UA: NEGATIVE
Glucose, UA: NEGATIVE
Ketones, UA: NEGATIVE
Nitrite, UA: NEGATIVE
Spec Grav, UA: 1.02
Urobilinogen, UA: NEGATIVE U/dL — AB
pH, UA: 5

## 2018-02-21 NOTE — Progress Notes (Signed)
Name: Emily Eaton   MRN: [id number]    DOB: Oct 15, 1976   Date:02/21/2018       Progress Note  Subjective  Chief Complaint  Chief Complaint  Patient presents with  . Urinary Tract Infection    HPI   PT presents with concern for LEFT low back and abdominal pain with intermittent dysuria and foul-smelling urine x5-6 days.  Vaginal discharge is usually clear/white and it was light brown yesterday - not due for her menses. Also notes history of ovarian cysts and these sometimes cause her low back pain similar to this pain.  Denies hematuria, history of kidney stones, NVD.  Has been taking Aleve without relief of her pain.   Patient Active Problem List   Diagnosis Date Noted  . Stiffness of right hand joint 07/28/2015  . ADD (attention deficit hyperactivity disorder, inattentive type) 07/25/2015  . Pain in toe 07/25/2015  . Chronic constipation 07/25/2015  . Herpes 07/25/2015  . Hemorrhoids, internal 07/25/2015  . Obesity (BMI 30-39.9) 07/25/2015  . Vitamin D deficiency 07/25/2015  . Radiculitis of right cervical region 04/14/2015  . Epicondylitis elbow, medial 10/02/2013  . Entrapment of ulnar nerve 10/02/2013  . Impingement syndrome of shoulder 10/02/2013  . Ovarian cyst 11/15/2011    Social History   Tobacco Use  . Smoking status: Never Smoker  . Smokeless tobacco: Never Used  Substance Use Topics  . Alcohol use: Yes    Alcohol/week: 1.2 oz    Types: 2 Standard drinks or equivalent per week    Comment: occassionally     Current Outpatient Medications:  .  cholecalciferol (VITAMIN D) 1000 UNITS tablet, Take 2,000 Units by mouth once., Disp: , Rfl:  .  citalopram (CELEXA) 20 MG tablet, Take 1 tablet (20 mg total) by mouth daily. (Patient not taking: Reported on 02/21/2018), Disp: 30 tablet, Rfl: 2 .  cyclobenzaprine (FLEXERIL) 5 MG tablet, Take by mouth., Disp: , Rfl:  .  etodolac (LODINE) 500 MG tablet, Take by mouth., Disp: , Rfl:  .  fluconazole (DIFLUCAN) 150 MG  tablet, Take 1 tablet (150 mg total) by mouth every other day. (Patient not taking: Reported on 02/21/2018), Disp: 3 tablet, Rfl: 0 .  fluticasone (FLONASE) 50 MCG/ACT nasal spray, Place into the nose., Disp: , Rfl:  .  polyethylene glycol powder (GLYCOLAX/MIRALAX) powder, TAKE 17 GRAMS BY MOUTH 2 TIMES DAILY AS NEEDED. (Patient not taking: Reported on 02/21/2018), Disp: 1020 g, Rfl: 2  Allergies  Allergen Reactions  . Codeine Itching and Rash    ROS  Ten systems reviewed and is negative except as mentioned in HPI  Objective  Vitals:   02/21/18 1438  BP: 122/84  Pulse: 86  Resp: 18  Temp: 98.2 F (36.8 C)  TempSrc: Oral  SpO2: 99%  Weight: 224 lb 12.8 oz (102 kg)  Height: 5\' 4"  (1.626 m)   Body mass index is 38.59 kg/m.  Nursing Note and Vital Signs reviewed.  Physical Exam  Constitutional: Patient appears well-developed and well-nourished. Obese. No distress.  HEENT: head atraumatic, normocephalic Cardiovascular: Normal rate, regular rhythm, S1/S2 present.  No murmur or rub heard. No BLE edema. Pulmonary/Chest: Effort normal and breath sounds clear. No respiratory distress or retractions. Abdominal: Soft and mild BLQ tenderness, bowel sounds present x4 quadrants.  No CVA Tenderness Psychiatric: Patient has a normal mood and affect. behavior is normal. Judgment and thought content normal.  Results for orders placed or performed in visit on 02/21/18 (from the past 72 hour(s))  POCT urinalysis dipstick     Status: Abnormal   Collection Time: 02/21/18  2:42 PM  Result Value Ref Range   Color, UA yellow    Clarity, UA clear    Glucose, UA negative    Bilirubin, UA negative    Ketones, UA negative    Spec Grav, UA 1.020 1.010 - 1.025   Blood, UA negative    pH, UA 5.0 5.0 - 8.0   Protein, UA trace    Urobilinogen, UA negative (A) 0.2 or 1.0 E.U./dL   Nitrite, UA negative    Leukocytes, UA Trace (A) Negative   Appearance clear    Odor none     Assessment &  Plan  1. Dysuria - POCT urinalysis dipstick - Urine Culture - Advised UA is unremarkable, and we will hold on treatment until culture is returned.  2. Vaginal discharge - POCT urinalysis dipstick - WET PREP BY MOLECULAR PROBE  3. Flank pain - POCT urinalysis dipstick - Urine Culture  4. Cyst of ovary, unspecified laterality - Advised if wet prep and urine culture are negative, we will refer her back to GYN for further evaluation.  -Red flags and when to present for emergency care or RTC including fever >101.38F, chest pain, shortness of breath, new/worsening/un-resolving symptoms, severe back/flank/abdominal pain, vomiting, hematuria reviewed with patient at time of visit. Follow up and care instructions discussed and provided in AVS.

## 2018-02-22 ENCOUNTER — Other Ambulatory Visit: Payer: Self-pay | Admitting: Family Medicine

## 2018-02-22 DIAGNOSIS — B9689 Other specified bacterial agents as the cause of diseases classified elsewhere: Secondary | ICD-10-CM

## 2018-02-22 DIAGNOSIS — N760 Acute vaginitis: Principal | ICD-10-CM

## 2018-02-22 LAB — URINE CULTURE
MICRO NUMBER: 90289727
RESULT: NO GROWTH
SPECIMEN QUALITY:: ADEQUATE

## 2018-02-22 LAB — WET PREP BY MOLECULAR PROBE
CANDIDA SPECIES: NOT DETECTED
MICRO NUMBER:: 90292317
SPECIMEN QUALITY:: ADEQUATE
Trichomonas vaginosis: NOT DETECTED

## 2018-02-22 MED ORDER — METRONIDAZOLE 500 MG PO TABS: 500.0000 mg | ORAL_TABLET | Freq: Two times a day (BID) | ORAL | 0 refills | Status: AC

## 2018-03-09 ENCOUNTER — Encounter: Payer: Self-pay | Admitting: Family Medicine

## 2018-07-24 ENCOUNTER — Ambulatory Visit: Payer: 59 | Admitting: Family Medicine

## 2018-07-24 ENCOUNTER — Encounter: Payer: Self-pay | Admitting: Family Medicine

## 2018-07-24 VITALS — BP 128/72 | HR 79 | Temp 98.4°F | Resp 16 | Ht 64.0 in | Wt 230.7 lb

## 2018-07-24 DIAGNOSIS — F320 Major depressive disorder, single episode, mild: Secondary | ICD-10-CM | POA: Diagnosis not present

## 2018-07-24 DIAGNOSIS — R7303 Prediabetes: Secondary | ICD-10-CM

## 2018-07-24 DIAGNOSIS — M545 Low back pain, unspecified: Secondary | ICD-10-CM

## 2018-07-24 DIAGNOSIS — D708 Other neutropenia: Secondary | ICD-10-CM | POA: Diagnosis not present

## 2018-07-24 DIAGNOSIS — E785 Hyperlipidemia, unspecified: Secondary | ICD-10-CM | POA: Diagnosis not present

## 2018-07-24 DIAGNOSIS — E669 Obesity, unspecified: Secondary | ICD-10-CM

## 2018-07-24 DIAGNOSIS — G44209 Tension-type headache, unspecified, not intractable: Secondary | ICD-10-CM

## 2018-07-24 DIAGNOSIS — E559 Vitamin D deficiency, unspecified: Secondary | ICD-10-CM

## 2018-07-24 DIAGNOSIS — Z79899 Other long term (current) drug therapy: Secondary | ICD-10-CM

## 2018-07-24 DIAGNOSIS — M79605 Pain in left leg: Secondary | ICD-10-CM

## 2018-07-24 MED ORDER — CYCLOBENZAPRINE HCL 5 MG PO TABS: 5.0000 mg | ORAL_TABLET | Freq: Every day | ORAL | 2 refills | Status: DC

## 2018-07-24 MED ORDER — NORTRIPTYLINE HCL 10 MG PO CAPS: 10.0000 mg | ORAL_CAPSULE | Freq: Every day | ORAL | 2 refills | Status: DC

## 2018-07-24 MED ORDER — CITALOPRAM HYDROBROMIDE 20 MG PO TABS: 20.0000 mg | ORAL_TABLET | Freq: Every day | ORAL | 2 refills | Status: DC

## 2018-07-24 MED ORDER — VITAMIN D (ERGOCALCIFEROL) 1.25 MG (50000 UNIT) PO CAPS: 50000.0000 [IU] | ORAL_CAPSULE | ORAL | 0 refills | Status: DC

## 2018-07-24 NOTE — Progress Notes (Signed)
Name: Emily Eaton   MRN: [id number]    DOB: [birth year]   Date:07/24/2018       Progress Note  Subjective  Chief Complaint  Chief Complaint  Patient presents with  . Headache    patient presents with a tension headache for the past week or so. otc: Aleve and allergy/decongestant.  . Ear Fullness    patient also has been having some bilateral ear fullness with some slight dizziness    HPI  Major Depression: she has a long history of depression, she was doing better and stopped taking Celexa last year, however since March she is feeling worse. She states mother had a major stroke, finalized her divorce 06/2018, younger son has ITP ( found out in 2018 ) and now her older son that is in the [employer] is getting deployed to [place] this week. She falls asleep but wakes up during the night and cannot fall back asleep. She is fatigued, very irritable. Feeling angry at times.   Tension headaches: she states her head is in temporal area and sometimes nuchal area, described as tightness also has ear fullness and had an episode of dizziness - felt off balance. She had an URI two weeks ago. No fever or chills. Denies hearing loss she had one episode of tinnitus on both ears. No sound sensitivity but has noticed intermittent photophobia. Nausea only when dizzy.   Obesity: gained some weight since last visit, she is a stress eater  Low back pain: chronic and intermittent, on left lower back and occasionally goes down left leg.    Patient Active Problem List   Diagnosis Date Noted  . Mild major depression ([place]) 07/24/2018  . Tension headache 07/24/2018  . Stiffness of right hand joint 07/28/2015  . ADD (attention deficit hyperactivity disorder, inattentive type) 07/25/2015  . Pain in toe 07/25/2015  . Chronic constipation 07/25/2015  . Herpes 07/25/2015  . Hemorrhoids, internal 07/25/2015  . Obesity (BMI 30-39.9) 07/25/2015  . Vitamin D deficiency 07/25/2015  . Radiculitis of right cervical  region 04/14/2015  . Epicondylitis elbow, medial 10/02/2013  . Entrapment of ulnar nerve 10/02/2013  . Impingement syndrome of shoulder 10/02/2013  . Ovarian cyst 11/15/2011    Past Surgical History:  Procedure Laterality Date  . ENDOMETRIAL BIOPSY    . NOVASURE ABLATION    . OVARIAN CYST REMOVAL     left  . TUBAL LIGATION    . WISDOM TOOTH EXTRACTION     x 1    Family History  Problem Relation Age of Onset  . Diabetes Mother   . Depression Mother   . Hypertension Mother   . Kidney disease Mother   . Hyperlipidemia Mother   . Dementia Mother   . Stroke Mother        March 18, 2018 (currently in rehab)   . Hypertension Father   . Diabetes Father   . Liver disease Father   . Asthma Brother   . Cancer Maternal Uncle        prostate  . Thrombocytopenia Son        ITP    Social History   Socioeconomic History  . Marital status: Married    Spouse name: Dorthula Rue  . Number of children: 2  . Years of education: 59  . Highest education level: Associate degree: occupational, Hotel manager, or vocational program  Occupational History  . Occupation: Pharmacist, hospital    CommentBuilding surveyor  . Occupation: Chemical engineer:  [employer]  Social Needs  . Financial resource strain: Not hard at all  . Food insecurity:    Worry: Never true    Inability: Never true  . Transportation needs:    Medical: No    Non-medical: No  Tobacco Use  . Smoking status: Never Smoker  . Smokeless tobacco: Never Used  Substance and Sexual Activity  . Alcohol use: Yes    Alcohol/week: 1.2 oz    Types: 2 Standard drinks or equivalent per week    Comment: occassionally  . Drug use: No  . Sexual activity: Yes    Partners: Male    Birth control/protection: Surgical    Comment: Tubaligation  Lifestyle  . Physical activity:    Days per week: 0 days    Minutes per session: 0 min  . Stress: Rather much  Relationships  . Social connections:    Talks on phone: More than three times a week     Gets together: Twice a week    Attends religious service: Never    Active member of club or organization: No    Attends meetings of clubs or organizations: Never    Relationship status: Divorced  . Intimate partner violence:    Fear of current or ex partner: No    Emotionally abused: No    Physically abused: No    Forced sexual activity: No  Other Topics Concern  . Not on file  Social History Narrative   Divorced July 5th, 2019   Son is in the [employer] going to [place] Summer from 07/2018 to 06/2019     Current Outpatient Medications:  .  etodolac (LODINE) 500 MG tablet, Take by mouth., Disp: , Rfl:  .  fluticasone (FLONASE) 50 MCG/ACT nasal spray, Place into the nose., Disp: , Rfl:  .  polyethylene glycol powder (GLYCOLAX/MIRALAX) powder, TAKE 17 GRAMS BY MOUTH 2 TIMES DAILY AS NEEDED., Disp: 1020 g, Rfl: 2 .  cyclobenzaprine (FLEXERIL) 5 MG tablet, Take by mouth., Disp: , Rfl:   Allergies  Allergen Reactions  . Codeine Itching and Rash     ROS  Constitutional: Negative for fever, positive for  weight change.  Respiratory: Negative for cough and shortness of breath.   Cardiovascular: Negative for chest pain or palpitations.  Gastrointestinal: Negative for abdominal pain, no bowel changes.  Musculoskeletal: Negative for gait problem or joint swelling.  Skin: Negative for rash.  Neurological: positive for dizziness and  headache.  No other specific complaints in a complete review of systems (except as listed in HPI above).  Objective  Vitals:   07/24/18 0813  BP: 128/72  Pulse: 79  Resp: 16  Temp: 98.4 F (36.9 C)  TempSrc: Oral  SpO2: 99%  Weight: 230 lb 11.2 oz (104.6 kg)  Height: 5\' 4"  (1.626 m)    Body mass index is 39.6 kg/m.  Physical Exam  Constitutional: Patient appears well-developed and well-nourished. Obese  No distress.  HEENT: head atraumatic, normocephalic, pupils equal and reactive to light, ears TM, neck supple, throat within normal  limits Cardiovascular: Normal rate, regular rhythm and normal heart sounds.  No murmur heard. No BLE edema. Pulmonary/Chest: Effort normal and breath sounds normal. No respiratory distress. Abdominal: Soft.  There is no tenderness. Psychiatric: Patient has a normal mood and affect. behavior is normal. Judgment and thought content normal. Neurological: no focal findings, no nystagmus, normal grip. Romberg negative.    PHQ2/9: Depression screen [facility] 2/9 07/24/2018 10/11/2017 06/07/2017 07/07/2016 12/11/2015  Decreased Interest 0  0 2 0 0  Down, Depressed, Hopeless 1 1 2  0 0  PHQ - 2 Score 1 1 4  0 0  Altered sleeping 2 - 3 - -  Tired, decreased energy 2 - 3 - -  Change in appetite 2 - 3 - -  Feeling bad or failure about yourself  0 - 1 - -  Trouble concentrating 2 - 3 - -  Moving slowly or fidgety/restless 0 - 0 - -  Suicidal thoughts 0 - 0 - -  PHQ-9 Score 9 - 17 - -  Difficult doing work/chores Very difficult - - - -     Fall Risk: Fall Risk  07/24/2018 10/11/2017 07/07/2016 12/11/2015 07/28/2015  Falls in the past year? No No No Yes No  Number falls in past yr: - - - 1 -  Injury with Fall? - - - Yes -  Comment - - - walking her dog and fell on ice on steps, contusions on lower back -     Functional Status Survey: Is the patient deaf or have difficulty hearing?: No Does the patient have difficulty seeing, even when wearing glasses/contacts?: No Does the patient have difficulty concentrating, remembering, or making decisions?: Yes Does the patient have difficulty walking or climbing stairs?: No Does the patient have difficulty dressing or bathing?: No Does the patient have difficulty doing errands alone such as visiting a doctor's office or shopping?: No    Assessment & Plan  1. Mild major depression (HCC)   - COMPLETE METABOLIC PANEL WITH GFR - citalopram (CELEXA) 20 MG tablet; Take 1 tablet (20 mg total) by mouth daily.  Dispense: 30 tablet; Refill: 2  2. Vitamin D  deficiency  Resume rx vitamin D   3. Obesity (BMI 30-39.9)  Discussed with the patient the risk posed by an increased BMI. Discussed importance of portion control, calorie counting and at least 150 minutes of physical activity weekly. Avoid sweet beverages and drink more water. Eat at least 6 servings of fruit and vegetables daily   4. Tension headache  - nortriptyline (PAMELOR) 10 MG capsule; Take 1 capsule (10 mg total) by mouth at bedtime.  Dispense: 30 capsule; Refill: 2  5. Low back pain radiating to left leg  - cyclobenzaprine (FLEXERIL) 5 MG tablet; Take 1 tablet (5 mg total) by mouth at bedtime.  Dispense: 30 tablet; Refill: 2  6. Dyslipidemia  - COMPLETE METABOLIC PANEL WITH GFR - Lipid panel  7. Pre-diabetes  - COMPLETE METABOLIC PANEL WITH GFR - Hemoglobin A1c  8. Other neutropenia (HCC)  - CBC with Differential/Platelet  9. Mild major depression (HCC)  - COMPLETE METABOLIC PANEL WITH GFR - citalopram (CELEXA) 20 MG tablet; Take 1 tablet (20 mg total) by mouth daily.  Dispense: 30 tablet; Refill: 2  10. Long-term use of high-risk medication  - EKG 12-Lead - QT 406 , advised to take Citalopram in am and Pamelor at night, we will stay on lower doses and monitor

## 2018-07-25 ENCOUNTER — Encounter: Payer: Self-pay | Admitting: Family Medicine

## 2018-07-25 DIAGNOSIS — R7303 Prediabetes: Secondary | ICD-10-CM | POA: Insufficient documentation

## 2018-07-25 DIAGNOSIS — E785 Hyperlipidemia, unspecified: Secondary | ICD-10-CM | POA: Insufficient documentation

## 2018-07-25 LAB — COMPLETE METABOLIC PANEL WITH GFR
AG Ratio: 1.1 (calc) (ref 1.0–2.5)
ALT: 6 U/L (ref 6–29)
AST: 10 U/L (ref 10–30)
Albumin: 3.8 g/dL (ref 3.6–5.1)
Alkaline phosphatase (APISO): 62 U/L (ref 33–115)
BUN: 9 mg/dL (ref 7–25)
CALCIUM: 8.9 mg/dL (ref 8.6–10.2)
CHLORIDE: 107 mmol/L (ref 98–110)
CO2: 24 mmol/L (ref 20–32)
Creat: 0.67 mg/dL (ref 0.50–1.10)
GFR, EST AFRICAN AMERICAN: 124 mL/min/{1.73_m2} (ref >=60)
GFR, EST NON AFRICAN AMERICAN: 107 mL/min/{1.73_m2} (ref >=60)
GLUCOSE: 95 mg/dL (ref 65–99)
Globulin: 3.4 g/dL (calc) (ref 1.9–3.7)
Potassium: 4.2 mmol/L (ref 3.5–5.3)
Sodium: 138 mmol/L (ref 135–146)
TOTAL PROTEIN: 7.2 g/dL (ref 6.1–8.1)
Total Bilirubin: 0.3 mg/dL (ref 0.2–1.2)

## 2018-07-25 LAB — CBC WITH DIFFERENTIAL/PLATELET
BASOS PCT: 0.3 %
Basophils Absolute: 11 cells/uL (ref 0–200)
EOS PCT: 1.6 %
Eosinophils Absolute: 59 cells/uL (ref 15–500)
HCT: 36.7 % (ref 35.0–45.0)
Hemoglobin: 11.9 g/dL (ref 11.7–15.5)
Lymphs Abs: 1217 cells/uL (ref 850–3900)
MCH: 25.3 pg — ABNORMAL LOW (ref 27.0–33.0)
MCHC: 32.4 g/dL (ref 32.0–36.0)
MCV: 77.9 fL — ABNORMAL LOW (ref 80.0–100.0)
MONOS PCT: 10.7 %
MPV: 9.3 fL (ref 7.5–12.5)
Neutro Abs: 2017 cells/uL (ref 1500–7800)
Neutrophils Relative %: 54.5 %
PLATELETS: 254 10*3/uL (ref 140–400)
RBC: 4.71 10*6/uL (ref 3.80–5.10)
RDW: 12.6 % (ref 11.0–15.0)
TOTAL LYMPHOCYTE: 32.9 %
WBC mixed population: 396 cells/uL (ref 200–950)
WBC: 3.7 10*3/uL — ABNORMAL LOW (ref 3.8–10.8)

## 2018-07-25 LAB — LIPID PANEL
Cholesterol: 191 mg/dL (ref <200)
HDL: 44 mg/dL — AB (ref >50)
LDL CHOLESTEROL (CALC): 134 mg/dL — AB
NON-HDL CHOLESTEROL (CALC): 147 mg/dL — AB (ref <130)
TRIGLYCERIDES: 43 mg/dL (ref <150)
Total CHOL/HDL Ratio: 4.3 (calc) (ref <5.0)

## 2018-07-25 LAB — HEMOGLOBIN A1C
HEMOGLOBIN A1C: 5.9 %{Hb} — AB (ref <5.7)
Mean Plasma Glucose: 123 (calc)
eAG (mmol/L): 6.8 (calc)

## 2018-08-30 ENCOUNTER — Ambulatory Visit: Payer: 59 | Admitting: Family Medicine

## 2018-08-30 ENCOUNTER — Encounter: Payer: Self-pay | Admitting: Family Medicine

## 2018-08-30 ENCOUNTER — Other Ambulatory Visit (HOSPITAL_COMMUNITY)
Admission: RE | Admit: 2018-08-30 | Discharge: 2018-08-30 | Disposition: A | Discharge status: Home / Self Care | Payer: 59 | Source: Ambulatory Visit | Attending: Family Medicine | Admitting: Family Medicine

## 2018-08-30 VITALS — BP 120/84 | HR 68 | Temp 98.7°F | Resp 16 | Ht 64.0 in | Wt 230.6 lb

## 2018-08-30 DIAGNOSIS — J40 Bronchitis, not specified as acute or chronic: Secondary | ICD-10-CM | POA: Insufficient documentation

## 2018-08-30 DIAGNOSIS — N898 Other specified noninflammatory disorders of vagina: Secondary | ICD-10-CM | POA: Insufficient documentation

## 2018-08-30 DIAGNOSIS — Z6839 Body mass index (BMI) 39.0-39.9, adult: Secondary | ICD-10-CM | POA: Diagnosis not present

## 2018-08-30 DIAGNOSIS — F329 Major depressive disorder, single episode, unspecified: Secondary | ICD-10-CM | POA: Insufficient documentation

## 2018-08-30 DIAGNOSIS — R102 Pelvic and perineal pain: Secondary | ICD-10-CM | POA: Diagnosis not present

## 2018-08-30 DIAGNOSIS — Z79899 Other long term (current) drug therapy: Secondary | ICD-10-CM | POA: Diagnosis not present

## 2018-08-30 DIAGNOSIS — E669 Obesity, unspecified: Secondary | ICD-10-CM | POA: Diagnosis not present

## 2018-08-30 DIAGNOSIS — Z7951 Long term (current) use of inhaled steroids: Secondary | ICD-10-CM | POA: Insufficient documentation

## 2018-08-30 DIAGNOSIS — Z113 Encounter for screening for infections with a predominantly sexual mode of transmission: Secondary | ICD-10-CM

## 2018-08-30 DIAGNOSIS — N83209 Unspecified ovarian cyst, unspecified side: Secondary | ICD-10-CM | POA: Insufficient documentation

## 2018-08-30 DIAGNOSIS — F909 Attention-deficit hyperactivity disorder, unspecified type: Secondary | ICD-10-CM | POA: Diagnosis not present

## 2018-08-30 DIAGNOSIS — N939 Abnormal uterine and vaginal bleeding, unspecified: Secondary | ICD-10-CM

## 2018-08-30 DIAGNOSIS — E559 Vitamin D deficiency, unspecified: Secondary | ICD-10-CM | POA: Insufficient documentation

## 2018-08-30 DIAGNOSIS — E785 Hyperlipidemia, unspecified: Secondary | ICD-10-CM | POA: Insufficient documentation

## 2018-08-30 DIAGNOSIS — Z114 Encounter for screening for human immunodeficiency virus [HIV]: Secondary | ICD-10-CM

## 2018-08-30 MED ORDER — ALBUTEROL SULFATE HFA 108 (90 BASE) MCG/ACT IN AERS: 2.0000 | INHALATION_SPRAY | Freq: Four times a day (QID) | RESPIRATORY_TRACT | 0 refills | Status: DC | PRN

## 2018-08-30 MED ORDER — PREDNISONE 10 MG PO TABS: ORAL_TABLET | ORAL | 0 refills | Status: AC

## 2018-08-30 MED ORDER — BENZONATATE 100 MG PO CAPS: 100.0000 mg | ORAL_CAPSULE | Freq: Two times a day (BID) | ORAL | 0 refills | Status: DC | PRN

## 2018-08-30 MED ORDER — BUDESONIDE-FORMOTEROL FUMARATE 80-4.5 MCG/ACT IN AERO: 2.0000 | INHALATION_SPRAY | Freq: Two times a day (BID) | RESPIRATORY_TRACT | 3 refills | Status: DC

## 2018-08-30 MED ORDER — MELOXICAM 15 MG PO TABS: 15.0000 mg | ORAL_TABLET | Freq: Every day | ORAL | 0 refills | Status: DC

## 2018-08-30 NOTE — Progress Notes (Signed)
Name: Emily Eaton   MRN: [id number]    DOB: [birth year]   Date:08/30/2018       Progress Note  Subjective  Chief Complaint  Chief Complaint  Patient presents with  . Sinusitis    for 3 weeks  . Cyst    have cyst on ovaries that have ruptured for 3 weeks. Have a discharge    HPI  Pt presents with the following concerns:  URI: She notes two week history of sinus congestion, sore throat, hoarse voice, chest congestion, yellow/green phlegm, some shortness of breath, chest tightness with coughing, right sided ear pain/pressure.  Denies fevers/chills, NVD.  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  Abnormal vaginal bleeding: Menses started on time (August 30), was light pink, then dark brown, two days of no bleeding, then started bleeding again.  Notes foul odor with the discharge as well.  She called her OB/GYN and they cannot see her until October.  LEFT Lower quadrant abdominal pain 6/10.  She denies lightheadedness/dizziness. Has history ovarian cysts - she has not had any imaging since at least 2017.  Most recent kidney function was normal in 07/24/2018; most recent CBC shows low MCV and MCH - normal MCHC and Hgb/HCT. Blood flow is currently light.  Has been taking ibuprofen and aleve without relief.  Patient Active Problem List   Diagnosis Date Noted  . Pre-diabetes 07/25/2018  . Dyslipidemia 07/25/2018  . Mild major depression ([place]) 07/24/2018  . Tension headache 07/24/2018  . Stiffness of right hand joint 07/28/2015  . ADD (attention deficit hyperactivity disorder, inattentive type) 07/25/2015  . Pain in toe 07/25/2015  . Chronic constipation 07/25/2015  . Herpes 07/25/2015  . Hemorrhoids, internal 07/25/2015  . Obesity (BMI 30-39.9) 07/25/2015  . Vitamin D deficiency 07/25/2015  . Radiculitis of right cervical region 04/14/2015  . Epicondylitis elbow, medial 10/02/2013  . Entrapment of ulnar nerve 10/02/2013  .  Impingement syndrome of shoulder 10/02/2013  . Ovarian cyst 11/15/2011    Social History   Tobacco Use  . Smoking status: Never Smoker  . Smokeless tobacco: Never Used  Substance Use Topics  . Alcohol use: Yes    Alcohol/week: 2.0 standard drinks    Types: 2 Standard drinks or equivalent per week    Comment: occassionally     Current Outpatient Medications:  .  citalopram (CELEXA) 20 MG tablet, Take 1 tablet (20 mg total) by mouth daily., Disp: 30 tablet, Rfl: 2 .  cyclobenzaprine (FLEXERIL) 5 MG tablet, Take 1 tablet (5 mg total) by mouth at bedtime., Disp: 30 tablet, Rfl: 2 .  fluticasone (FLONASE) 50 MCG/ACT nasal spray, Place into the nose., Disp: , Rfl:  .  nortriptyline (PAMELOR) 10 MG capsule, Take 1 capsule (10 mg total) by mouth at bedtime., Disp: 30 capsule, Rfl: 2 .  polyethylene glycol powder (GLYCOLAX/MIRALAX) powder, TAKE 17 GRAMS BY MOUTH 2 TIMES DAILY AS NEEDED., Disp: 1020 g, Rfl: 2 .  Vitamin D, Ergocalciferol, (DRISDOL) 50000 units CAPS capsule, Take 1 capsule (50,000 Units total) by mouth every 7 (seven) days., Disp: 12 capsule, Rfl: 0  Allergies  Allergen Reactions  . Codeine Itching and Rash    ROS  Ten systems reviewed and is negative except as mentioned in HPI  Objective  Vitals:   08/30/18 0745  BP: 120/84  Pulse: 68  Resp: 16  Temp: 98.7 F (37.1 C)  TempSrc: Oral  SpO2: 99%  Weight: 230 lb 9.6 oz (104.6 kg)  Height: 5\' 4"  (1.626 m)   Body mass index is 39.58 kg/m.  Nursing Note and Vital Signs reviewed.  Physical Exam Constitutional: Patient appears well-developed and well-nourished. No distress.  HENT: Head: Normocephalic and atraumatic. Ears: B TMs ok, no erythema or effusion; Nose: Nose normal. Mouth/Throat: Oropharynx is clear and moist. No oropharyngeal exudate or erythema.  Voice quality is hoarse, congested, raspy cough is present during examination  Eyes: Conjunctivae and EOM are normal. Pupils are equal, round, and reactive  to light. No scleral icterus.  Neck: Normal range of motion. Neck supple. No JVD present. No thyromegaly present.  Cardiovascular: Normal rate, regular rhythm and normal heart sounds.  No murmur heard. No BLE edema. Pulmonary/Chest: Effort normal and breath sounds normal - no wheezing, crackles, or coarse lung sounds. No respiratory distress. Abdominal: Soft. Bowel sounds are normal, no distension. There is LEFT lower pelvic tenderness. no masses are palpable Breast: no lumps or masses, no nipple discharge or rashes FEMALE GENITALIA:  External genitalia normal External urethra normal Vaginal vault normal without lesions Cervix appears friable with thick yellow-white discharge present. No sanguinous discharge. Bimanual exam without masses, no CMT. Musculoskeletal: Normal range of motion, no joint effusions. No gross deformities Neurological: he is alert and oriented to person, place, and time. No cranial nerve deficit. Coordination, balance, strength, speech and gait are normal.  Skin: Skin is warm and dry. No rash noted. No erythema.  Psychiatric: Patient has a normal mood and affect. behavior is normal. Judgment and thought content normal.  No results found for this or any previous visit (from the past 72 hour(s)).  Assessment & Plan 1. Abnormal vaginal bleeding - US PELVIC COMPLETE WITH TRANSVAGINAL; Future - Advised she must call to schedule. - CBC w/Diff/Platelet - Cervicovaginal ancillary only  2. Cyst of ovary, unspecified laterality - US PELVIC COMPLETE WITH TRANSVAGINAL; Future - CBC w/Diff/Platelet - meloxicam (MOBIC) 15 MG tablet; Take 1 tablet (15 mg total) by mouth daily.  Dispense: 30 tablet; Refill: 0  3. Bronchitis - budesonide-formoterol (SYMBICORT) 80-4.5 MCG/ACT inhaler; Inhale 2 puffs into the lungs 2 (two) times daily.  Dispense: 1 Inhaler; Refill: 3 - predniSONE (DELTASONE) 10 MG tablet; Take 5 tablets (50 mg total) by mouth daily with breakfast for 1 day, THEN 4  tablets (40 mg total) daily with breakfast for 1 day, THEN 3 tablets (30 mg total) daily with breakfast for 1 day, THEN 2 tablets (20 mg total) daily with breakfast for 1 day, THEN 1 tablet (10 mg total) daily with breakfast for 1 day.  Dispense: 15 tablet; Refill: 0 - benzonatate (TESSALON) 100 MG capsule; Take 1 capsule (100 mg total) by mouth 2 (two) times daily as needed for cough.  Dispense: 20 capsule; Refill: 0 - albuterol (PROVENTIL HFA;VENTOLIN HFA) 108 (90 Base) MCG/ACT inhaler; Inhale 2 puffs into the lungs every 6 (six) hours as needed for wheezing or shortness of breath.  Dispense: 1 Inhaler; Refill: 0  4. Pelvic pain - meloxicam (MOBIC) 15 MG tablet; Take 1 tablet (15 mg total) by mouth daily.  Dispense: 30 tablet; Refill: 0 - Cervicovaginal ancillary only - Discussed increased risk of GI bleed with NSAID use - advised to take with food, monitor BM's, and take only as needed. She verbalizes understanding.  5. Foul smelling vaginal discharge - Cervicovaginal ancillary only  6. Routine screening for STI (sexually transmitted infection) - Cervicovaginal ancillary only - HIV antibody - RPR  7. Screening for  HIV without presence of risk factors - HIV antibody  -Red flags and when to present for emergency care or RTC including fever >101.53F, chest pain, shortness of breath, new/worsening/un-resolving symptoms, lightheadedness/dizziness/near-syncope/syncope, increased vaginal bleeding, severe abdominal pain reviewed with patient at time of visit. Follow up and care instructions discussed and provided in AVS.

## 2018-08-30 NOTE — Patient Instructions (Addendum)
Please call to schedule your imaging test at [phone].  You must call to schedule, they will not call you.   Continue Zyrtec and flonase, start taking Mucinex 600mg  Twice daily.

## 2018-08-31 ENCOUNTER — Other Ambulatory Visit: Payer: Self-pay | Admitting: Family Medicine

## 2018-08-31 ENCOUNTER — Telehealth: Payer: Self-pay | Admitting: Family Medicine

## 2018-08-31 DIAGNOSIS — N760 Acute vaginitis: Principal | ICD-10-CM

## 2018-08-31 DIAGNOSIS — B9689 Other specified bacterial agents as the cause of diseases classified elsewhere: Secondary | ICD-10-CM

## 2018-08-31 LAB — CBC WITH DIFFERENTIAL/PLATELET
Basophils Absolute: 21 cells/uL (ref 0–200)
Basophils Relative: 0.5 %
EOS ABS: 111 {cells}/uL (ref 15–500)
Eosinophils Relative: 2.7 %
HEMATOCRIT: 38.1 % (ref 35.0–45.0)
HEMOGLOBIN: 12.1 g/dL (ref 11.7–15.5)
LYMPHS ABS: 1419 {cells}/uL (ref 850–3900)
MCH: 25.2 pg — AB (ref 27.0–33.0)
MCHC: 31.8 g/dL — ABNORMAL LOW (ref 32.0–36.0)
MCV: 79.4 fL — AB (ref 80.0–100.0)
MPV: 9.1 fL (ref 7.5–12.5)
Monocytes Relative: 10.5 %
Neutro Abs: 2120 cells/uL (ref 1500–7800)
Neutrophils Relative %: 51.7 %
Platelets: 280 10*3/uL (ref 140–400)
RBC: 4.8 10*6/uL (ref 3.80–5.10)
RDW: 12.9 % (ref 11.0–15.0)
Total Lymphocyte: 34.6 %
WBC: 4.1 10*3/uL (ref 3.8–10.8)
WBCMIX: 431 {cells}/uL (ref 200–950)

## 2018-08-31 LAB — CERVICOVAGINAL ANCILLARY ONLY
Bacterial vaginitis: POSITIVE — AB
Candida vaginitis: NEGATIVE
Chlamydia: NEGATIVE
NEISSERIA GONORRHEA: NEGATIVE
TRICH (WINDOWPATH): NEGATIVE

## 2018-08-31 LAB — HIV ANTIBODY (ROUTINE TESTING W REFLEX): HIV: NONREACTIVE

## 2018-08-31 LAB — RPR: RPR: NONREACTIVE

## 2018-08-31 MED ORDER — METRONIDAZOLE 500 MG PO TABS: 500.0000 mg | ORAL_TABLET | Freq: Two times a day (BID) | ORAL | 0 refills | Status: AC

## 2018-08-31 NOTE — Telephone Encounter (Signed)
Erroneous

## 2018-09-05 ENCOUNTER — Ambulatory Visit
Admission: RE | Admit: 2018-09-05 | Discharge: 2018-09-05 | Disposition: A | Discharge status: Home / Self Care | Payer: 59 | Source: Ambulatory Visit | Attending: Family Medicine | Admitting: Family Medicine

## 2018-09-05 DIAGNOSIS — D259 Leiomyoma of uterus, unspecified: Secondary | ICD-10-CM | POA: Insufficient documentation

## 2018-09-05 DIAGNOSIS — Z87898 Personal history of other specified conditions: Secondary | ICD-10-CM | POA: Diagnosis not present

## 2018-09-05 DIAGNOSIS — N83209 Unspecified ovarian cyst, unspecified side: Secondary | ICD-10-CM | POA: Diagnosis present

## 2018-09-05 DIAGNOSIS — N939 Abnormal uterine and vaginal bleeding, unspecified: Secondary | ICD-10-CM | POA: Diagnosis not present

## 2018-09-26 ENCOUNTER — Encounter: Payer: Self-pay | Admitting: Family Medicine

## 2018-10-24 DIAGNOSIS — N920 Excessive and frequent menstruation with regular cycle: Secondary | ICD-10-CM | POA: Diagnosis not present

## 2018-10-24 DIAGNOSIS — Z01419 Encounter for gynecological examination (general) (routine) without abnormal findings: Secondary | ICD-10-CM | POA: Diagnosis not present

## 2018-11-29 ENCOUNTER — Other Ambulatory Visit (HOSPITAL_COMMUNITY)
Admission: RE | Admit: 2018-11-29 | Discharge: 2018-11-29 | Disposition: A | Discharge status: Home / Self Care | Payer: 59 | Source: Ambulatory Visit | Attending: Family Medicine | Admitting: Family Medicine

## 2018-11-29 ENCOUNTER — Encounter: Payer: Self-pay | Admitting: Family Medicine

## 2018-11-29 ENCOUNTER — Ambulatory Visit: Payer: 59 | Admitting: Family Medicine

## 2018-11-29 VITALS — BP 126/82 | HR 84 | Temp 97.9°F | Resp 16 | Ht 64.0 in | Wt 232.1 lb

## 2018-11-29 DIAGNOSIS — N898 Other specified noninflammatory disorders of vagina: Secondary | ICD-10-CM | POA: Insufficient documentation

## 2018-11-29 DIAGNOSIS — N944 Primary dysmenorrhea: Secondary | ICD-10-CM | POA: Insufficient documentation

## 2018-11-29 DIAGNOSIS — M545 Low back pain, unspecified: Secondary | ICD-10-CM

## 2018-11-29 DIAGNOSIS — R102 Pelvic and perineal pain: Secondary | ICD-10-CM | POA: Insufficient documentation

## 2018-11-29 DIAGNOSIS — F320 Major depressive disorder, single episode, mild: Secondary | ICD-10-CM

## 2018-11-29 DIAGNOSIS — Z23 Encounter for immunization: Secondary | ICD-10-CM | POA: Diagnosis not present

## 2018-11-29 DIAGNOSIS — E559 Vitamin D deficiency, unspecified: Secondary | ICD-10-CM

## 2018-11-29 DIAGNOSIS — M79605 Pain in left leg: Secondary | ICD-10-CM

## 2018-11-29 DIAGNOSIS — G44209 Tension-type headache, unspecified, not intractable: Secondary | ICD-10-CM

## 2018-11-29 MED ORDER — NORTRIPTYLINE HCL 10 MG PO CAPS: 10.0000 mg | ORAL_CAPSULE | Freq: Every day | ORAL | 2 refills | Status: DC

## 2018-11-29 MED ORDER — VITAMIN D 50 MCG (2000 UT) PO CAPS: 1.0000 | ORAL_CAPSULE | Freq: Every day | ORAL | 0 refills | Status: AC

## 2018-11-29 MED ORDER — METRONIDAZOLE 500 MG PO TABS: 500.0000 mg | ORAL_TABLET | Freq: Two times a day (BID) | ORAL | 0 refills | Status: DC

## 2018-11-29 MED ORDER — MELOXICAM 15 MG PO TABS: 15.0000 mg | ORAL_TABLET | Freq: Every day | ORAL | 0 refills | Status: DC

## 2018-11-29 NOTE — Progress Notes (Signed)
Name: Emily Eaton   MRN: [id number]    DOB: [birth year]   Date:11/29/2018       Progress Note  Subjective  Chief Complaint  Chief Complaint  Patient presents with  . Abdominal Pain    Onset-1 month  . Vaginal Discharge    Brownish discharge, odor. vaginal irritation and has tried [employer] with some relief  . Urinary Frequency    HPI  Major Depression: she has a long history of depression, she was doing better and stopped taking Celexa lain 2018  however since March she is feeling worse. She states mother had a major stroke, finalized her divorce 06/2018, younger son has ITP ( found out in 2018 ) and now her older son that is in the [employer] is was deployed to [place] August 2019. Phq 9 was 11, but she does not want to take medication. She states she is okay She denies suicidal thoughts or ideation  Vaginal Discharge: she is divorced, but they still have intercourse, they use condoms 100% of the time, she states she used some sex toys around Thanksgiving that were properly cleaned and shortly after she developed itching and brownish discharge, she had some lower abdominal pain, but not out of the ordinary, she states feels like previous BV. She is currently on her cycle. She was seen by Dr. Ronnald Ramp for worsening of cramping and will have IUD placed, she has uterine fibroids  Tension headaches: she states her head is in temporal area and sometimes nuchal area, described as tightness also has ear fullness and had an episode of dizziness - felt off balance, but no recent episodes with dizziness . No fever or chills.She takes Pamelor occasionally but not recently. No recent episodes of headaches  Back pain: she has episodes of pain and takes flexeril prn    Patient Active Problem List   Diagnosis Date Noted  . Pre-diabetes 07/25/2018  . Dyslipidemia 07/25/2018  . Mild major depression ([place]) 07/24/2018  . Tension headache 07/24/2018  . Stiffness of right hand joint 07/28/2015   . ADD (attention deficit hyperactivity disorder, inattentive type) 07/25/2015  . Pain in toe 07/25/2015  . Chronic constipation 07/25/2015  . Herpes 07/25/2015  . Hemorrhoids, internal 07/25/2015  . Obesity (BMI 30-39.9) 07/25/2015  . Vitamin D deficiency 07/25/2015  . Radiculitis of right cervical region 04/14/2015  . Epicondylitis elbow, medial 10/02/2013  . Entrapment of ulnar nerve 10/02/2013  . Impingement syndrome of shoulder 10/02/2013  . Ovarian cyst 11/15/2011    Past Surgical History:  Procedure Laterality Date  . ENDOMETRIAL BIOPSY    . NOVASURE ABLATION    . OVARIAN CYST REMOVAL     left  . TUBAL LIGATION    . WISDOM TOOTH EXTRACTION     x 1    Family History  Problem Relation Age of Onset  . Diabetes Mother   . Depression Mother   . Hypertension Mother   . Kidney disease Mother   . Hyperlipidemia Mother   . Dementia Mother   . Stroke Mother        March 18, 2018 (currently in rehab)   . Hypertension Father   . Diabetes Father   . Liver disease Father   . Asthma Brother   . Cancer Maternal Uncle        prostate  . Thrombocytopenia Son        ITP    Social History   Socioeconomic History  . Marital status: Divorced  Spouse name: Dorthula Rue  . Number of children: 2  . Years of education: 51  . Highest education level: Associate degree: occupational, Hotel manager, or vocational program  Occupational History  . Occupation: Pharmacist, hospital    CommentBuilding surveyor  . Occupation: Chemical engineer: [employer]  . Financial resource strain: Not hard at all  . Food insecurity:    Worry: Never true    Inability: Never true  . Transportation needs:    Medical: No    Non-medical: No  Tobacco Use  . Smoking status: Never Smoker  . Smokeless tobacco: Never Used  Substance and Sexual Activity  . Alcohol use: Yes    Alcohol/week: 2.0 standard drinks    Types: 2 Standard drinks or equivalent per week    Comment: occassionally  . Drug  use: No  . Sexual activity: Yes    Partners: Male    Birth control/protection: Surgical    Comment: Tubaligation  Lifestyle  . Physical activity:    Days per week: 0 days    Minutes per session: 0 min  . Stress: Rather much  Relationships  . Social connections:    Talks on phone: More than three times a week    Gets together: Twice a week    Attends religious service: Never    Active member of club or organization: No    Attends meetings of clubs or organizations: Never    Relationship status: Divorced  . Intimate partner violence:    Fear of current or ex partner: No    Emotionally abused: No    Physically abused: No    Forced sexual activity: No  Other Topics Concern  . Not on file  Social History Narrative   Divorced July 5th, 2019   Son is in the [employer] going to [place] Summer from 07/2018 to 06/2019     Current Outpatient Medications:  .  cyclobenzaprine (FLEXERIL) 5 MG tablet, Take 1 tablet (5 mg total) by mouth at bedtime., Disp: 30 tablet, Rfl: 2 .  fluticasone (FLONASE) 50 MCG/ACT nasal spray, Place into the nose., Disp: , Rfl:  .  meloxicam (MOBIC) 15 MG tablet, Take 1 tablet (15 mg total) by mouth daily., Disp: 30 tablet, Rfl: 0 .  nortriptyline (PAMELOR) 10 MG capsule, Take 1 capsule (10 mg total) by mouth at bedtime., Disp: 30 capsule, Rfl: 2 .  polyethylene glycol powder (GLYCOLAX/MIRALAX) powder, TAKE 17 GRAMS BY MOUTH 2 TIMES DAILY AS NEEDED., Disp: 1020 g, Rfl: 2 .  Vitamin D, Ergocalciferol, (DRISDOL) 50000 units CAPS capsule, Take 1 capsule (50,000 Units total) by mouth every 7 (seven) days., Disp: 12 capsule, Rfl: 0 .  Cholecalciferol (VITAMIN D) 50 MCG (2000 UT) CAPS, Take 1 capsule (2,000 Units total) by mouth daily., Disp: 30 capsule, Rfl: 0 .  metroNIDAZOLE (FLAGYL) 500 MG tablet, Take 1 tablet (500 mg total) by mouth 2 (two) times daily., Disp: 14 tablet, Rfl: 0  Allergies  Allergen Reactions  . Codeine Itching and Rash    I personally  reviewed active problem list, medication list, allergies, family history, social history with the patient/caregiver today.   ROS  Constitutional: Negative for fever or weight change.  Respiratory: Negative for cough and shortness of breath.   Cardiovascular: Negative for chest pain or palpitations.  Gastrointestinal: Negative for abdominal pain ( but has cramping during cycles)  no bowel changes.  Musculoskeletal: Negative for gait problem or joint swelling.  Skin: Negative for rash.  Neurological: Negative for dizziness, positive for intermittent  headache.  No other specific complaints in a complete review of systems (except as listed in HPI above).  Objective  Vitals:   11/29/18 0827  BP: 126/82  Pulse: 84  Resp: 16  Temp: 97.9 F (36.6 C)  TempSrc: Oral  SpO2: 98%  Weight: 232 lb 1.6 oz (105.3 kg)  Height: 5\' 4"  (1.626 m)    Body mass index is 39.84 kg/m.  Physical Exam  Constitutional: Patient appears well-developed and well-nourished. Obese  No distress.  HEENT: head atraumatic, normocephalic, pupils equal and reactive to light, neck supple, throat within normal limits Cardiovascular: Normal rate, regular rhythm and normal heart sounds.  No murmur heard. No BLE edema. Pulmonary/Chest: Effort normal and breath sounds normal. No respiratory distress. Abdominal: Soft.  There is no tenderness. Pelvic: not done, she is currently on her cycle, self swab done by patient  Psychiatric: Patient has a normal mood and affect. behavior is normal. Judgment and thought content normal.  PHQ2/9: Depression screen [facility] 2/9 11/29/2018 08/30/2018 07/24/2018 10/11/2017 06/07/2017  Decreased Interest 1 1 0 0 2  Down, Depressed, Hopeless 1 1 1 1 2   PHQ - 2 Score 2 2 1 1 4   Altered sleeping 2 2 2  - 3  Tired, decreased energy 2 1 2  - 3  Change in appetite 2 0 2 - 3  Feeling bad or failure about yourself  1 0 0 - 1  Trouble concentrating 1 0 2 - 3  Moving slowly or fidgety/restless 1 0 0 -  0  Suicidal thoughts 0 0 0 - 0  PHQ-9 Score 11 5 9  - 17  Difficult doing work/chores Not difficult at all Somewhat difficult Very difficult - -     Fall Risk: Fall Risk  11/29/2018 08/30/2018 07/24/2018 10/11/2017 07/07/2016  Falls in the past year? 0 No No No No  Number falls in past yr: 0 - - - -  Injury with Fall? 0 - - - -  Comment - - - - -    Functional Status Survey: Is the patient deaf or have difficulty hearing?: No Does the patient have difficulty seeing, even when wearing glasses/contacts?: No Does the patient have difficulty concentrating, remembering, or making decisions?: No Does the patient have difficulty walking or climbing stairs?: No Does the patient have difficulty dressing or bathing?: No Does the patient have difficulty doing errands alone such as visiting a doctor's office or shopping?: No    Assessment & Plan   1. Vaginal discharge  - Wet prep, genital - metroNIDAZOLE (FLAGYL) 500 MG tablet; Take 1 tablet (500 mg total) by mouth 2 (two) times daily.  Dispense: 14 tablet; Refill: 0  2. Need for immunization against influenza  refused  3. Low back pain radiating to left leg  Taking flexeril prn   4. Pelvic pain  Seeing Dr. Bayard MalesRonnald Ramp and will have IUD She has fibroids, always had cramps, but worse lately  5. Tension headache  - nortriptyline (PAMELOR) 10 MG capsule; Take 1 capsule (10 mg total) by mouth at bedtime.  Dispense: 30 capsule; Refill: 2  6. Primary dysmenorrhea  - meloxicam (MOBIC) 15 MG tablet; Take 1 tablet (15 mg total) by mouth daily.  Dispense: 30 tablet; Refill: 0  7. Mild major depression ([place])  Does not like taking medication, she states she is okay   8. Vitamin D deficiency  - Cholecalciferol (VITAMIN D) 50 MCG (2000 UT) CAPS; Take 1 capsule (2,000 Units  total) by mouth daily.  Dispense: 30 capsule; Refill: 0

## 2018-11-30 LAB — CERVICOVAGINAL ANCILLARY ONLY
Bacterial vaginitis: POSITIVE — AB
Candida vaginitis: NEGATIVE
Chlamydia: NEGATIVE
Neisseria Gonorrhea: NEGATIVE
Trichomonas: NEGATIVE

## 2019-05-03 ENCOUNTER — Other Ambulatory Visit: Payer: Self-pay

## 2019-05-03 ENCOUNTER — Other Ambulatory Visit (HOSPITAL_COMMUNITY)
Admission: RE | Admit: 2019-05-03 | Discharge: 2019-05-03 | Disposition: A | Discharge status: Home / Self Care | Payer: BLUE CROSS/BLUE SHIELD | Source: Ambulatory Visit | Attending: Family Medicine | Admitting: Family Medicine

## 2019-05-03 ENCOUNTER — Ambulatory Visit: Payer: 59 | Admitting: Family Medicine

## 2019-05-03 ENCOUNTER — Encounter: Payer: Self-pay | Admitting: Family Medicine

## 2019-05-03 VITALS — BP 110/80 | HR 85 | Temp 98.2°F | Resp 16 | Ht 64.0 in | Wt 227.7 lb

## 2019-05-03 DIAGNOSIS — N898 Other specified noninflammatory disorders of vagina: Secondary | ICD-10-CM | POA: Diagnosis not present

## 2019-05-03 DIAGNOSIS — D708 Other neutropenia: Secondary | ICD-10-CM | POA: Diagnosis not present

## 2019-05-03 DIAGNOSIS — N760 Acute vaginitis: Secondary | ICD-10-CM | POA: Diagnosis not present

## 2019-05-03 DIAGNOSIS — F320 Major depressive disorder, single episode, mild: Secondary | ICD-10-CM | POA: Diagnosis not present

## 2019-05-03 DIAGNOSIS — E669 Obesity, unspecified: Secondary | ICD-10-CM

## 2019-05-03 DIAGNOSIS — B9689 Other specified bacterial agents as the cause of diseases classified elsewhere: Secondary | ICD-10-CM | POA: Diagnosis not present

## 2019-05-03 DIAGNOSIS — E559 Vitamin D deficiency, unspecified: Secondary | ICD-10-CM

## 2019-05-03 DIAGNOSIS — F5104 Psychophysiologic insomnia: Secondary | ICD-10-CM

## 2019-05-03 MED ORDER — HYDROXYZINE HCL 10 MG PO TABS: 10.0000 mg | ORAL_TABLET | Freq: Every day | ORAL | 0 refills | Status: DC

## 2019-05-03 MED ORDER — METRONIDAZOLE 500 MG PO TABS: 500.0000 mg | ORAL_TABLET | Freq: Two times a day (BID) | ORAL | 0 refills | Status: DC

## 2019-05-03 NOTE — Progress Notes (Signed)
Name: Emily Eaton   MRN: [id number]    DOB: September 09, 1976   Date:05/03/2019       Progress Note  Subjective  Chief Complaint  Chief Complaint  Patient presents with  . Vaginitis    HPI  Major Depression: she has a long history of depression, she was doing better and stopped taking Celexa lain 2018 She states mother had a major stroke, finalized her divorce 06/2018, younger son has ITP ( found out in 2018, he is doing better now )  older son that is in the national guards is was deployed to [place] August 2019. Her mother is now hospitalized for COVID-19 and father recently diagnosed with prostate cancer.   Vaginal Discharge: she is divorced, but they still have intercourse, they use condoms 100% but recently changed condoms brand. She has noticed some vaginal discharge and itching for the past two weeks, shortly after last intercourse, she states feels like her regular BV  Tension headaches: she states her head is in temporal area and sometimes nuchal area, described as tightness also has ear fullness and had an episode of dizziness - felt off balance, but no recent episodes with dizziness . No fever or chills.She takes Pamelor occasionally but not recently. No recent episodes of headaches, she is doing well now  Insomnia: she has noticed difficulty falling and staying asleep, more stressed than usual. Nortriptyline is not working we will try hydroxyzine   Back pain: she has episodes of pain and takes flexeril prn , off Meloxicam   Obesity: discussed importance of daily physical active   Patient Active Problem List   Diagnosis Date Noted  . Other neutropenia ([place]) 05/03/2019  . Pre-diabetes 07/25/2018  . Dyslipidemia 07/25/2018  . Mild major depression ([place]) 07/24/2018  . Tension headache 07/24/2018  . Stiffness of right hand joint 07/28/2015  . ADD (attention deficit hyperactivity disorder, inattentive type) 07/25/2015  . Pain in toe 07/25/2015  . Chronic constipation 07/25/2015   . Herpes 07/25/2015  . Hemorrhoids, internal 07/25/2015  . Obesity (BMI 30-39.9) 07/25/2015  . Vitamin D deficiency 07/25/2015  . Radiculitis of right cervical region 04/14/2015  . Epicondylitis elbow, medial 10/02/2013  . Entrapment of ulnar nerve 10/02/2013  . Impingement syndrome of shoulder 10/02/2013  . Ovarian cyst 11/15/2011    Past Surgical History:  Procedure Laterality Date  . ENDOMETRIAL BIOPSY    . NOVASURE ABLATION    . OVARIAN CYST REMOVAL     left  . TUBAL LIGATION    . WISDOM TOOTH EXTRACTION     x 1    Family History  Problem Relation Age of Onset  . Diabetes Mother   . Depression Mother   . Hypertension Mother   . Kidney disease Mother   . Hyperlipidemia Mother   . Dementia Mother   . Stroke Mother        March 18, 2018 (currently in rehab)   . Hypertension Father   . Diabetes Father   . Liver disease Father   . Prostate cancer Father   . Asthma Brother   . Cancer Maternal Uncle        prostate  . Thrombocytopenia Son        ITP    Social History   Socioeconomic History  . Marital status: Divorced    Spouse name: Dorthula Rue  . Number of children: 2  . Years of education: 59  . Highest education level: Associate degree: occupational, Hotel manager, or vocational program  Occupational History  .  Occupation: Pharmacist, hospital    CommentBuilding surveyor  . Occupation: Chemical engineer: [employer]  . Financial resource strain: Not hard at all  . Food insecurity:    Worry: Never true    Inability: Never true  . Transportation needs:    Medical: No    Non-medical: No  Tobacco Use  . Smoking status: Never Smoker  . Smokeless tobacco: Never Used  Substance and Sexual Activity  . Alcohol use: Yes    Alcohol/week: 2.0 standard drinks    Types: 2 Standard drinks or equivalent per week    Comment: occassionally  . Drug use: No  . Sexual activity: Yes    Partners: Male    Birth control/protection: Surgical    Comment: Tubaligation   Lifestyle  . Physical activity:    Days per week: 0 days    Minutes per session: 0 min  . Stress: Rather much  Relationships  . Social connections:    Talks on phone: More than three times a week    Gets together: Twice a week    Attends religious service: Never    Active member of club or organization: No    Attends meetings of clubs or organizations: Never    Relationship status: Divorced  . Intimate partner violence:    Fear of current or ex partner: No    Emotionally abused: No    Physically abused: No    Forced sexual activity: No  Other Topics Concern  . Not on file  Social History Narrative   Divorced July 5th, 2019, but they are still sexually active   Son is in the [employer] going to [place]  from 07/2018 to 06/2019   Mother in [place] after stroke in 2019   Father diagnosed with prostate cancer 04/2019     Current Outpatient Medications:  .  Cholecalciferol (VITAMIN D) 50 MCG (2000 UT) CAPS, Take 1 capsule (2,000 Units total) by mouth daily., Disp: 30 capsule, Rfl: 0 .  cyclobenzaprine (FLEXERIL) 5 MG tablet, Take 1 tablet (5 mg total) by mouth at bedtime., Disp: 30 tablet, Rfl: 2 .  fluticasone (FLONASE) 50 MCG/ACT nasal spray, Place into the nose., Disp: , Rfl:  .  polyethylene glycol powder (GLYCOLAX/MIRALAX) powder, TAKE 17 GRAMS BY MOUTH 2 TIMES DAILY AS NEEDED., Disp: 1020 g, Rfl: 2 .  hydrOXYzine (ATARAX/VISTARIL) 10 MG tablet, Take 1-2 tablets (10-20 mg total) by mouth at bedtime., Disp: 60 tablet, Rfl: 0 .  metroNIDAZOLE (FLAGYL) 500 MG tablet, Take 1 tablet (500 mg total) by mouth 2 (two) times daily., Disp: 14 tablet, Rfl: 0  Allergies  Allergen Reactions  . Codeine Itching and Rash    I personally reviewed active problem list, medication list, allergies with the patient/caregiver today.   ROS  Constitutional: Negative for fever or weight change.  Respiratory: Negative for cough and shortness of breath.   Cardiovascular: Negative for  chest pain or palpitations.  Gastrointestinal: Negative for abdominal pain, no bowel changes.  Musculoskeletal: Negative for gait problem or joint swelling.  Skin: Negative for rash.  Neurological: Negative for dizziness or headache.  No other specific complaints in a complete review of systems (except as listed in HPI above).  Objective  Vitals:   05/03/19 1109  BP: 110/80  Pulse: 85  Resp: 16  Temp: 98.2 F (36.8 C)  TempSrc: Oral  SpO2: 98%  Weight: 227 lb 11.2 oz (103.3 kg)  Height: 5\' 4"  (1.626 m)  Body mass index is 39.08 kg/m.  Physical Exam  Constitutional: Patient appears well-developed and well-nourished. Obese  No distress.  HEENT: head atraumatic, normocephalic, pupils equal and reactive to light,  neck supple, throat within normal limits Cardiovascular: Normal rate, regular rhythm and normal heart sounds.  No murmur heard. No BLE edema. Pulmonary/Chest: Effort normal and breath sounds normal. No respiratory distress. Abdominal: Soft.  There is no tenderness. Psychiatric: Patient has a normal mood and affect. behavior is normal. Judgment and thought content normal.   PHQ2/9: Depression screen [facility] 2/9 05/03/2019 11/29/2018 08/30/2018 07/24/2018 10/11/2017  Decreased Interest 1 1 1  0 0  Down, Depressed, Hopeless 2 1 1 1 1   PHQ - 2 Score 3 2 2 1 1   Altered sleeping 3 2 2 2  -  Tired, decreased energy 2 2 1 2  -  Change in appetite 2 2 0 2 -  Feeling bad or failure about yourself  0 1 0 0 -  Trouble concentrating 0 1 0 2 -  Moving slowly or fidgety/restless 2 1 0 0 -  Suicidal thoughts 0 0 0 0 -  PHQ-9 Score 12 11 5 9  -  Difficult doing work/chores Somewhat difficult Not difficult at all Somewhat difficult Very difficult -    phq 9 is positive   Fall Risk: Fall Risk  05/03/2019 11/29/2018 08/30/2018 07/24/2018 10/11/2017  Falls in the past year? 0 0 No No No  Number falls in past yr: 0 0 - - -  Injury with Fall? 0 0 - - -  Comment - - - - -     Functional  Status Survey: Is the patient deaf or have difficulty hearing?: No Does the patient have difficulty seeing, even when wearing glasses/contacts?: No Does the patient have difficulty concentrating, remembering, or making decisions?: No Does the patient have difficulty walking or climbing stairs?: No Does the patient have difficulty dressing or bathing?: No Does the patient have difficulty doing errands alone such as visiting a doctor's office or shopping?: No    Assessment & Plan  1. Other neutropenia ([place])  Recheck yearly   2. Vaginal discharge  - Cervicovaginal ancillary only - metroNIDAZOLE (FLAGYL) 500 MG tablet; Take 1 tablet (500 mg total) by mouth 2 (two) times daily.  Dispense: 14 tablet; Refill: 0  3. Mild major depression ([facility])  - hydrOXYzine (ATARAX/VISTARIL) 10 MG tablet; Take 1-2 tablets (10-20 mg total) by mouth at bedtime.  Dispense: 60 tablet; Refill: 0  4. Vitamin D deficiency  Continue vitamin D otc  5. Obesity (BMI 30-39.9)  Discussed with the patient the risk posed by an increased BMI. Discussed importance of portion control, calorie counting and at least 150 minutes of physical activity weekly. Avoid sweet beverages and drink more water. Eat at least 6 servings of fruit and vegetables daily   6. Psychophysiological insomnia  - hydrOXYzine (ATARAX/VISTARIL) 10 MG tablet; Take 1-2 tablets (10-20 mg total) by mouth at bedtime.  Dispense: 60 tablet; Refill: 0

## 2019-05-07 LAB — CERVICOVAGINAL ANCILLARY ONLY
Bacterial vaginitis: POSITIVE — AB
Candida vaginitis: POSITIVE — AB
Chlamydia: NEGATIVE
Neisseria Gonorrhea: NEGATIVE
Trichomonas: NEGATIVE

## 2019-05-25 ENCOUNTER — Other Ambulatory Visit: Payer: Self-pay

## 2019-05-25 ENCOUNTER — Emergency Department: Payer: BC Managed Care – PPO

## 2019-05-25 ENCOUNTER — Emergency Department
Admission: EM | Admit: 2019-05-25 | Discharge: 2019-05-25 | Disposition: A | Discharge status: Home / Self Care | Payer: BC Managed Care – PPO | Attending: Emergency Medicine | Admitting: Emergency Medicine

## 2019-05-25 DIAGNOSIS — R0789 Other chest pain: Secondary | ICD-10-CM | POA: Diagnosis not present

## 2019-05-25 DIAGNOSIS — R7303 Prediabetes: Secondary | ICD-10-CM | POA: Diagnosis not present

## 2019-05-25 DIAGNOSIS — M549 Dorsalgia, unspecified: Secondary | ICD-10-CM | POA: Diagnosis not present

## 2019-05-25 DIAGNOSIS — R079 Chest pain, unspecified: Secondary | ICD-10-CM | POA: Diagnosis not present

## 2019-05-25 DIAGNOSIS — Z79899 Other long term (current) drug therapy: Secondary | ICD-10-CM | POA: Insufficient documentation

## 2019-05-25 LAB — CBC WITH DIFFERENTIAL/PLATELET
Abs Immature Granulocytes: 0.01 10*3/uL (ref 0.00–0.07)
Basophils Absolute: 0 10*3/uL (ref 0.0–0.1)
Basophils Relative: 0 %
Eosinophils Absolute: 0.1 10*3/uL (ref 0.0–0.5)
Eosinophils Relative: 2 %
HCT: 41.2 % (ref 36.0–46.0)
Hemoglobin: 12.9 g/dL (ref 12.0–15.0)
Immature Granulocytes: 0 %
Lymphocytes Relative: 35 %
Lymphs Abs: 1.8 10*3/uL (ref 0.7–4.0)
MCH: 25.4 pg — ABNORMAL LOW (ref 26.0–34.0)
MCHC: 31.3 g/dL (ref 30.0–36.0)
MCV: 81.1 fL (ref 80.0–100.0)
Monocytes Absolute: 0.6 10*3/uL (ref 0.1–1.0)
Monocytes Relative: 12 %
Neutro Abs: 2.6 10*3/uL (ref 1.7–7.7)
Neutrophils Relative %: 51 %
Platelets: 276 10*3/uL (ref 150–400)
RBC: 5.08 MIL/uL (ref 3.87–5.11)
RDW: 13.4 % (ref 11.5–15.5)
WBC: 5.1 10*3/uL (ref 4.0–10.5)
nRBC: 0 % (ref 0.0–0.2)

## 2019-05-25 LAB — COMPREHENSIVE METABOLIC PANEL
ALT: 11 U/L (ref 0–44)
AST: 13 U/L — ABNORMAL LOW (ref 15–41)
Albumin: 3.7 g/dL (ref 3.5–5.0)
Alkaline Phosphatase: 57 U/L (ref 38–126)
Anion gap: 7 (ref 5–15)
BUN: 13 mg/dL (ref 6–20)
CO2: 24 mmol/L (ref 22–32)
Calcium: 9 mg/dL (ref 8.9–10.3)
Chloride: 107 mmol/L (ref 98–111)
Creatinine, Ser: 0.63 mg/dL (ref 0.44–1.00)
GFR calc Af Amer: >60 mL/min (ref >60)
GFR calc non Af Amer: >60 mL/min (ref >60)
Glucose, Bld: 93 mg/dL (ref 70–99)
Potassium: 4.5 mmol/L (ref 3.5–5.1)
Sodium: 138 mmol/L (ref 135–145)
Total Bilirubin: 0.3 mg/dL (ref 0.3–1.2)
Total Protein: 8.1 g/dL (ref 6.5–8.1)

## 2019-05-25 LAB — FIBRIN DERIVATIVES D-DIMER (ARMC ONLY): Fibrin derivatives D-dimer (ARMC): 296.88 ng/mL (FEU) (ref 0.00–499.00)

## 2019-05-25 LAB — TROPONIN I: Troponin I: <0.03 ng/mL (ref <0.03)

## 2019-05-25 LAB — LIPASE, BLOOD: Lipase: 39 U/L (ref 11–51)

## 2019-05-25 MED ORDER — KETOROLAC TROMETHAMINE 30 MG/ML IJ SOLN
15.0000 mg | Freq: Once | INTRAMUSCULAR | Status: AC
  Administered 2019-05-25: 15 mg via INTRAVENOUS
  Filled 2019-05-25: qty 1

## 2019-05-25 NOTE — Discharge Instructions (Addendum)
Your work-up was reassuring here.  You  would prefer not to stay for further testing which is certainly your choice.  However, it does limit our ability to continue to monitor you.  If you have any new or worrisome symptoms including increased pain, change in your pain, shortness of breath or you feel worse in any way return to the emergency department.  Please follow closely with your primary care doctor and the cardiologist listed above.

## 2019-05-25 NOTE — ED Provider Notes (Addendum)
[facility] Emergency Department Provider Note  ____________________________________________   I have reviewed the triage vital signs and the nursing notes. Where available I have reviewed prior notes and, if possible and indicated, outside hospital notes.    HISTORY  Chief Complaint Chest Pain    HPI Emily Eaton is a 42 y.o. female  Patient seen and evaluated during the coronavirus epidemic during a time with low staffing, patient with a history of the below listed medical problems, but no personal history of DVT or PE presents today complaining of chest wall pain, a "soreness" which she cannot specifically localize to the chest.  Also she has some back pain on the left side as well.  It is positional, when she touches it or changes position it hurts.  When she lies still it is less painful.  She states it is not exertional she is had no shortness of breath or dyspnea no recent travel, she denies any signs or symptoms of PE or DVT aside from the discomfort in her chest.  She does state however that her mother had a blood clot.  She has not had any exertional symptoms.  She states the pain first noted this morning when she was "lying funny" in the middle of the night got up to urinate around 3 AM, and found her staff to have this slight discomfort which is gotten gradually worse since that time but is never really gone away.  Feels like it feels when you sleep on it wrong she states.    Past Medical History:  Diagnosis Date  . Acute low back pain   . ADD (attention deficit disorder) without hyperactivity   . Anemia   . Chronic constipation   . Herpes simplex without complication   . History of ovarian cyst 04/2010  . Internal hemorrhoids   . Iron deficiency anemia due to chronic blood loss   . PMS (premenstrual syndrome)   . Vitamin D deficiency     Patient Active Problem List   Diagnosis Date Noted  . Other neutropenia ([place]) 05/03/2019  . Pre-diabetes  07/25/2018  . Dyslipidemia 07/25/2018  . Mild major depression ([place]) 07/24/2018  . Tension headache 07/24/2018  . Stiffness of right hand joint 07/28/2015  . ADD (attention deficit hyperactivity disorder, inattentive type) 07/25/2015  . Pain in toe 07/25/2015  . Chronic constipation 07/25/2015  . Herpes 07/25/2015  . Hemorrhoids, internal 07/25/2015  . Obesity (BMI 30-39.9) 07/25/2015  . Vitamin D deficiency 07/25/2015  . Radiculitis of right cervical region 04/14/2015  . Epicondylitis elbow, medial 10/02/2013  . Entrapment of ulnar nerve 10/02/2013  . Impingement syndrome of shoulder 10/02/2013  . Ovarian cyst 11/15/2011    Past Surgical History:  Procedure Laterality Date  . ENDOMETRIAL BIOPSY    . NOVASURE ABLATION    . OVARIAN CYST REMOVAL     left  . TUBAL LIGATION    . WISDOM TOOTH EXTRACTION     x 1    Prior to Admission medications   Medication Sig Start Date End Date Taking? Authorizing Provider  Cholecalciferol (VITAMIN D) 50 MCG (2000 UT) CAPS Take 1 capsule (2,000 Units total) by mouth daily. 11/29/18   Steele Sizer, MD  cyclobenzaprine (FLEXERIL) 5 MG tablet Take 1 tablet (5 mg total) by mouth at bedtime. 07/24/18   Steele Sizer, MD  fluticasone (FLONASE) 50 MCG/ACT nasal spray Place into the nose.    [provider]  hydrOXYzine (ATARAX/VISTARIL) 10 MG tablet Take 1-2 tablets (  10-20 mg total) by mouth at bedtime. 05/03/19   Steele Sizer, MD  metroNIDAZOLE (FLAGYL) 500 MG tablet Take 1 tablet (500 mg total) by mouth 2 (two) times daily. 05/03/19   Sowles, Drue Stager, MD  polyethylene glycol powder (GLYCOLAX/MIRALAX) powder TAKE 17 GRAMS BY MOUTH 2 TIMES DAILY AS NEEDED. 10/26/17   Steele Sizer, MD    Allergies Codeine  Family History  Problem Relation Age of Onset  . Diabetes Mother   . Depression Mother   . Hypertension Mother   . Kidney disease Mother   . Hyperlipidemia Mother   . Dementia Mother   . Stroke Mother        March 18, 2018  (currently in rehab)   . Hypertension Father   . Diabetes Father   . Liver disease Father   . Prostate cancer Father   . Asthma Brother   . Cancer Maternal Uncle        prostate  . Thrombocytopenia Son        ITP    Social History Social History   Tobacco Use  . Smoking status: Never Smoker  . Smokeless tobacco: Never Used  Substance Use Topics  . Alcohol use: Yes    Alcohol/week: 2.0 standard drinks    Types: 2 Standard drinks or equivalent per week    Comment: occassionally  . Drug use: No    Review of Systems Constitutional: No fever/chills Eyes: No visual changes. ENT: No sore throat. No stiff neck no neck pain Cardiovascular: Denies chest pain. Respiratory: Denies shortness of breath. Gastrointestinal:   no vomiting.  No diarrhea.  No constipation. Genitourinary: Negative for dysuria. Musculoskeletal: Negative lower extremity swelling Skin: Negative for rash. Neurological: Negative for severe headaches, focal weakness or numbness.   ____________________________________________   PHYSICAL EXAM:  VITAL SIGNS: ED Triage Vitals  Enc Vitals Group     BP 05/25/19 1059 (!) 149/90     Pulse Rate 05/25/19 1059 79     Resp 05/25/19 1059 18     Temp 05/25/19 1059 98.1 F (36.7 C)     Temp Source 05/25/19 1059 Oral     SpO2 05/25/19 1059 99 %     Weight 05/25/19 1057 235 lb (106.6 kg)     Height 05/25/19 1057 5\' 5"  (1.651 m)     Head Circumference --      Peak Flow --      Pain Score 05/25/19 1057 7     Pain Loc --      Pain Edu? --      Excl. in [place]? --     Constitutional: Alert and oriented. Well appearing and in no acute distress. Eyes: Conjunctivae are normal Head: Atraumatic HEENT: No congestion/rhinnorhea. Mucous membranes are moist.  Oropharynx non-erythematous Neck:   Nontender with no meningismus, no masses, no stridor Chest: Tender palpation to the costochondral margin on the left chest wall when I touch this area patient states "ouch that the  pain right there" and pulls back.  No crepitus no flail chest, no lesions noted, nurse chaperone present for exam.  Nothing to suggest shingles or cellulitis.  She also has some reproducible pain in the rhomboid muscles of the left back and also discomfort when she moves her left arm. Cardiovascular: Normal rate, regular rhythm. Grossly normal heart sounds.  Good peripheral circulation. Respiratory: Normal respiratory effort.  No retractions. Lungs CTAB. Abdominal: Soft and nontender. No distention. No guarding no rebound Back:  There is no focal tenderness or step  off.  there is no midline tenderness there are no lesions noted. there is no CVA tenderness  Musculoskeletal: No lower extremity tenderness, no upper extremity tenderness. No joint effusions, no DVT signs strong distal pulses no edema Neurologic:  Normal speech and language. No gross focal neurologic deficits are appreciated.  Skin:  Skin is warm, dry and intact. No rash noted. Psychiatric: Mood and affect are normal. Speech and behavior are normal.  ____________________________________________   LABS (all labs ordered are listed, but only abnormal results are displayed)  Labs Reviewed  COMPREHENSIVE METABOLIC PANEL  CBC WITH DIFFERENTIAL/PLATELET  LIPASE, BLOOD  FIBRIN DERIVATIVES D-DIMER ([facility] ONLY)  TROPONIN I    Pertinent labs  results that were available during my care of the patient were reviewed by me and considered in my medical decision making (see chart for details). ____________________________________________  EKG  I personally interpreted any EKGs ordered by me or triage Sinus rhythm rate 83 bpm, borderline LAD no acute ST elevation or depression no acute ischemia noted ____________________________________________  RADIOLOGY  Pertinent labs & imaging results that were available during my care of the patient were reviewed by me and considered in my medical decision making (see chart for details). If possible,  patient and/or family made aware of any abnormal findings.  No results found. ____________________________________________    PROCEDURES  Procedure(s) performed: None  Procedures  Critical Care performed: None  ____________________________________________   INITIAL IMPRESSION / ASSESSMENT AND PLAN / ED COURSE  Pertinent labs & imaging results that were available during my care of the patient were reviewed by me and considered in my medical decision making (see chart for details).  43 year old woman here with very reproducible tenderness to her chest wall, which has been uninterruptedly pleasant now for 8 hours.  We will send cardiac enzymes because of family history of PE even though low suspicion I will send a d-dimer.  I do not believe this likely represents dissection.  We will continue to evaluate however after pain medications.  ----------------------------------------- 12:40 PM on 05/25/2019 -----------------------------------------  Work-up is very reassuring, EKG reassuring chest x-ray reassuring, troponin negative as anticipated, d-dimer is negative, which , given my pretest probability for PE in this patient was very low, I think should be sufficient to enable [place] to avoid a very high radiation scan on her.  In addition, there are data to suggest that a negative d-dimer as a biomarker for acute aortic dissection has some pretty significant sensitivity in the range of 94%, the study "d-dimer is a biomarker for acute aortic dissection" by Dr. Ilda Foil al as a meta-analysis to suggest a high sensitivity although limited specificity for dissection and again were using this is a screening test in a patient with very low pretest probability and very reproducible chest wall pain.  She is had pain uninterruptedly since 3 AM, which is almost 10 hours and there has been no evidence of any acute coronary syndrome, we will continue to evaluate the patient patient on further recollection and  believes that her mother had arterial stents placed because of arterial insufficiency in her leg not a DVT.  This is certainly helpful information.  ----------------------------------------- 12:51 PM on 05/25/2019 -----------------------------------------  Patient states her pain is nearly gone after Toradol it only hurts if she touches it or moves the wrong way, I did offer and advised that she stay for serial cardiac enzymes but she states that she would like to go home.  We discussed the risk benefits and alternatives  both of CT scan and not CT scan second troponin and no second opponent of admission and discharge etc. and her preference at this time is to go home.  I think she has the capacity to understand all of these decision making I believe that she understands everything that we have talked about.  I do not think this is an unreasonable decision.  It does appear that some limitations on the work-up but again patient is been having gradual onset discomfort in her chest which is quite reproducible since 3 AM at least, likely earlier but that is when she noticed it, and she has a completely reassuring work-up at this time so there is likely limited utility to serial enzymes in any event.  But it was made clear to her that without serial enzymes there is a limit to what I can evaluate her for in the department in terms of the full panoply of testing.  She understands and voices agreement with this plan and states that she is ready to go.  As a precaution, I am going to have her follow closely with cardiology just because of her age and stress test that her age might help set her mind at ease if this happens again.  At this time, there does not appear to be clinical evidence to support the diagnosis of pulmonary embolus, dissection, myocarditis, endocarditis, pericarditis, pericardial tamponade, acute coronary syndrome, pneumothorax, pneumonia, or any other acute intrathoracic pathology that will require  admission or acute intervention. Nor is there evidence of any significant intra-abdominal pathology causing this discomfort.    ____________________________________________   FINAL CLINICAL IMPRESSION(S) / ED DIAGNOSES  Final diagnoses:  None      This chart was dictated using voice recognition software.  Despite best efforts to proofread,  errors can occur which can change meaning.      Schuyler Amor, MD 05/25/19 1123    Schuyler Amor, MD 05/25/19 1243    Schuyler Amor, MD 05/25/19 1253

## 2019-05-25 NOTE — ED Triage Notes (Signed)
Pt c/o central chest pain that radiates into her upper back and neck that woke her from sleep this morning. Denies N/V/diaphoresis or SOB with chest pain.

## 2019-05-26 ENCOUNTER — Emergency Department
Admission: EM | Admit: 2019-05-26 | Discharge: 2019-05-26 | Disposition: A | Discharge status: Home / Self Care | Payer: BC Managed Care – PPO | Attending: Emergency Medicine | Admitting: Emergency Medicine

## 2019-05-26 ENCOUNTER — Other Ambulatory Visit: Payer: Self-pay

## 2019-05-26 DIAGNOSIS — Z79899 Other long term (current) drug therapy: Secondary | ICD-10-CM | POA: Diagnosis not present

## 2019-05-26 DIAGNOSIS — R079 Chest pain, unspecified: Secondary | ICD-10-CM

## 2019-05-26 DIAGNOSIS — R0789 Other chest pain: Secondary | ICD-10-CM | POA: Diagnosis not present

## 2019-05-26 LAB — BASIC METABOLIC PANEL
Anion gap: 7 (ref 5–15)
BUN: 16 mg/dL (ref 6–20)
CO2: 25 mmol/L (ref 22–32)
Calcium: 8.9 mg/dL (ref 8.9–10.3)
Chloride: 105 mmol/L (ref 98–111)
Creatinine, Ser: 0.91 mg/dL (ref 0.44–1.00)
GFR calc Af Amer: >60 mL/min (ref >60)
GFR calc non Af Amer: >60 mL/min (ref >60)
Glucose, Bld: 104 mg/dL — ABNORMAL HIGH (ref 70–99)
Potassium: 3.9 mmol/L (ref 3.5–5.1)
Sodium: 137 mmol/L (ref 135–145)

## 2019-05-26 LAB — CBC
HCT: 39.6 % (ref 36.0–46.0)
Hemoglobin: 12.3 g/dL (ref 12.0–15.0)
MCH: 25.2 pg — ABNORMAL LOW (ref 26.0–34.0)
MCHC: 31.1 g/dL (ref 30.0–36.0)
MCV: 81.1 fL (ref 80.0–100.0)
Platelets: 286 10*3/uL (ref 150–400)
RBC: 4.88 MIL/uL (ref 3.87–5.11)
RDW: 13.3 % (ref 11.5–15.5)
WBC: 5.9 10*3/uL (ref 4.0–10.5)
nRBC: 0 % (ref 0.0–0.2)

## 2019-05-26 LAB — TROPONIN I: Troponin I: <0.03 ng/mL (ref <0.03)

## 2019-05-26 MED ORDER — ALUM & MAG HYDROXIDE-SIMETH 200-200-20 MG/5ML PO SUSP
30.0000 mL | Freq: Once | ORAL | Status: AC
  Administered 2019-05-26: 30 mL via ORAL
  Filled 2019-05-26: qty 30

## 2019-05-26 MED ORDER — TRAMADOL HCL 50 MG PO TABS
50.0000 mg | ORAL_TABLET | Freq: Once | ORAL | Status: AC
  Administered 2019-05-26: 50 mg via ORAL
  Filled 2019-05-26: qty 1

## 2019-05-26 MED ORDER — SUCRALFATE 1 G PO TABS: 1.0000 g | ORAL_TABLET | Freq: Four times a day (QID) | ORAL | 0 refills | Status: DC

## 2019-05-26 MED ORDER — FAMOTIDINE 20 MG PO TABS: 20.0000 mg | ORAL_TABLET | Freq: Every day | ORAL | 1 refills | Status: DC

## 2019-05-26 MED ORDER — LIDOCAINE VISCOUS HCL 2 % MT SOLN
15.0000 mL | Freq: Once | OROMUCOSAL | Status: AC
  Administered 2019-05-26: 15 mL via ORAL
  Filled 2019-05-26: qty 15

## 2019-05-26 NOTE — Discharge Instructions (Addendum)
Please seek medical attention for any high fevers, chest pain, shortness of breath, change in behavior, persistent vomiting, bloody stool or any other new or concerning symptoms.  

## 2019-05-26 NOTE — ED Provider Notes (Signed)
[facility] Emergency Department Provider Note   ____________________________________________   I have reviewed the triage vital signs and the nursing notes.   HISTORY  Chief Complaint Chest Pain   History limited by: Not Limited   HPI Emily Eaton is a 43 y.o. female who presents to the emergency department today because of concerns for chest pain.  Located in her center chest does radiate up to bilateral shoulders.  Patient states that it woke her from sleep tonight.  It was severe.  Patient had similar pain last night and was evaluated in the emergency department.  She states after that visit she was feeling better.  She went to a cookout before going to bed and ate spicy food.   Records reviewed. Per medical record review patient has a history of ER visit yesterday morning with negative d-dimer, troponin.  Past Medical History:  Diagnosis Date  . Acute low back pain   . ADD (attention deficit disorder) without hyperactivity   . Anemia   . Chronic constipation   . Herpes simplex without complication   . History of ovarian cyst 04/2010  . Internal hemorrhoids   . Iron deficiency anemia due to chronic blood loss   . PMS (premenstrual syndrome)   . Vitamin D deficiency     Patient Active Problem List   Diagnosis Date Noted  . Other neutropenia ([place]) 05/03/2019  . Pre-diabetes 07/25/2018  . Dyslipidemia 07/25/2018  . Mild major depression ([place]) 07/24/2018  . Tension headache 07/24/2018  . Stiffness of right hand joint 07/28/2015  . ADD (attention deficit hyperactivity disorder, inattentive type) 07/25/2015  . Pain in toe 07/25/2015  . Chronic constipation 07/25/2015  . Herpes 07/25/2015  . Hemorrhoids, internal 07/25/2015  . Obesity (BMI 30-39.9) 07/25/2015  . Vitamin D deficiency 07/25/2015  . Radiculitis of right cervical region 04/14/2015  . Epicondylitis elbow, medial 10/02/2013  . Entrapment of ulnar nerve 10/02/2013  . Impingement  syndrome of shoulder 10/02/2013  . Ovarian cyst 11/15/2011    Past Surgical History:  Procedure Laterality Date  . ENDOMETRIAL BIOPSY    . NOVASURE ABLATION    . OVARIAN CYST REMOVAL     left  . TUBAL LIGATION    . WISDOM TOOTH EXTRACTION     x 1    Prior to Admission medications   Medication Sig Start Date End Date Taking? Authorizing Provider  Cholecalciferol (VITAMIN D) 50 MCG (2000 UT) CAPS Take 1 capsule (2,000 Units total) by mouth daily. 11/29/18   Steele Sizer, MD  cyclobenzaprine (FLEXERIL) 5 MG tablet Take 1 tablet (5 mg total) by mouth at bedtime. 07/24/18   Steele Sizer, MD  fluticasone (FLONASE) 50 MCG/ACT nasal spray Place into the nose.    [provider]  hydrOXYzine (ATARAX/VISTARIL) 10 MG tablet Take 1-2 tablets (10-20 mg total) by mouth at bedtime. 05/03/19   Steele Sizer, MD  metroNIDAZOLE (FLAGYL) 500 MG tablet Take 1 tablet (500 mg total) by mouth 2 (two) times daily. 05/03/19   Sowles, Drue Stager, MD  polyethylene glycol powder (GLYCOLAX/MIRALAX) powder TAKE 17 GRAMS BY MOUTH 2 TIMES DAILY AS NEEDED. 10/26/17   Steele Sizer, MD    Allergies Codeine  Family History  Problem Relation Age of Onset  . Diabetes Mother   . Depression Mother   . Hypertension Mother   . Kidney disease Mother   . Hyperlipidemia Mother   . Dementia Mother   . Stroke Mother        March 18, 2018 (currently in rehab)   . Hypertension Father   . Diabetes Father   . Liver disease Father   . Prostate cancer Father   . Asthma Brother   . Cancer Maternal Uncle        prostate  . Thrombocytopenia Son        ITP    Social History Social History   Tobacco Use  . Smoking status: Never Smoker  . Smokeless tobacco: Never Used  Substance Use Topics  . Alcohol use: Yes    Alcohol/week: 2.0 standard drinks    Types: 2 Standard drinks or equivalent per week    Comment: occassionally  . Drug use: No    Review of Systems Constitutional: No fever/chills Eyes: No  visual changes. ENT: No sore throat. Cardiovascular: Positive for chest pain Respiratory: Denies shortness of breath. Gastrointestinal: No abdominal pain.  No nausea, no vomiting.  No diarrhea.   Genitourinary: Negative for dysuria. Musculoskeletal: Negative for back pain. Skin: Negative for rash. Neurological: Negative for headaches, focal weakness or numbness.  ____________________________________________   PHYSICAL EXAM:  VITAL SIGNS: ED Triage Vitals  Enc Vitals Group     BP 05/26/19 0323 121/77     Pulse Rate 05/26/19 0323 98     Resp 05/26/19 0323 18     Temp 05/26/19 0323 98 F (36.7 C)     Temp Source 05/26/19 0323 Oral     SpO2 05/26/19 0323 98 %     Weight --      Height --      Head Circumference --      Peak Flow --      Pain Score 05/26/19 0322 10   Constitutional: Alert and oriented.  Eyes: Conjunctivae are normal.  ENT      Head: Normocephalic and atraumatic.      Nose: No congestion/rhinnorhea.      Mouth/Throat: Mucous membranes are moist.      Neck: No stridor. Hematological/Lymphatic/Immunilogical: No cervical lymphadenopathy. Cardiovascular: Normal rate, regular rhythm.  No murmurs, rubs, or gallops.  Respiratory: Normal respiratory effort without tachypnea nor retractions. Breath sounds are clear and equal bilaterally. No wheezes/rales/rhonchi. Gastrointestinal: Soft and non tender. No rebound. No guarding.  Genitourinary: Deferred Musculoskeletal: Normal range of motion in all extremities. No lower extremity edema. Neurologic:  Normal speech and language. No gross focal neurologic deficits are appreciated.  Skin:  Skin is warm, dry and intact. No rash noted. Psychiatric: Mood and affect are normal. Speech and behavior are normal. Patient exhibits appropriate insight and judgment.  ____________________________________________    LABS (pertinent positives/negatives)  Trop <0.03 BMP wnl except glu 104 CBC wbc 5.9, hgb 12.3, plt  286 ____________________________________________   EKG  I, Nance Pear, attending physician, personally viewed and interpreted this EKG  EKG Time: 0324 Rate: 93 Rhythm: normal sinus rhythm Axis: left axis deviation Intervals: qtc 447 QRS: narrow, LVH ST changes: no st elevation Impression: abnormal ekg  ____________________________________________    RADIOLOGY  None  ____________________________________________   PROCEDURES  Procedures  ____________________________________________   INITIAL IMPRESSION / ASSESSMENT AND PLAN / ED COURSE  Pertinent labs & imaging results that were available during my care of the patient were reviewed by me and considered in my medical decision making (see chart for details).   Patient presented to the emergency department today because of concerns for chest pain.  Patient was evaluated yesterday morning for similar complaints.  Had negative work-up at that time with negative chest x-ray, troponin and d-dimer.  I did try giving the patient a GI cocktail here.  She did have some relief.  At this point I do wonder if patient is suffering from bad esophagitis and acid reflux.  I discussed this with the patient.  Will plan on discharging with sucralfate and antiacid.  ____________________________________________   FINAL CLINICAL IMPRESSION(S) / ED DIAGNOSES  Final diagnoses:  Nonspecific chest pain     Note: This dictation was prepared with Dragon dictation. Any transcriptional errors that result from this process are unintentional     Nance Pear, MD 05/26/19 [phone]

## 2019-05-26 NOTE — ED Triage Notes (Signed)
Patient reports seen earlier on Saturday for same, but now it is worse.  Patient reports mid chest pain that radiates up into her shoulders.  Reports woke her from sleep around 2:40 am.

## 2019-06-08 ENCOUNTER — Encounter: Payer: Self-pay | Admitting: Intensive Care

## 2019-06-08 ENCOUNTER — Other Ambulatory Visit: Payer: Self-pay

## 2019-06-08 ENCOUNTER — Emergency Department
Admission: EM | Admit: 2019-06-08 | Discharge: 2019-06-08 | Disposition: A | Discharge status: Home / Self Care | Payer: BC Managed Care – PPO | Attending: Emergency Medicine | Admitting: Emergency Medicine

## 2019-06-08 ENCOUNTER — Emergency Department: Payer: BC Managed Care – PPO

## 2019-06-08 DIAGNOSIS — R1013 Epigastric pain: Secondary | ICD-10-CM

## 2019-06-08 DIAGNOSIS — R0789 Other chest pain: Secondary | ICD-10-CM | POA: Diagnosis not present

## 2019-06-08 DIAGNOSIS — Z79899 Other long term (current) drug therapy: Secondary | ICD-10-CM | POA: Insufficient documentation

## 2019-06-08 DIAGNOSIS — R918 Other nonspecific abnormal finding of lung field: Secondary | ICD-10-CM | POA: Diagnosis not present

## 2019-06-08 DIAGNOSIS — Z20828 Contact with and (suspected) exposure to other viral communicable diseases: Secondary | ICD-10-CM | POA: Diagnosis not present

## 2019-06-08 LAB — CBC
HCT: 39.6 % (ref 36.0–46.0)
Hemoglobin: 12.6 g/dL (ref 12.0–15.0)
MCH: 25.7 pg — ABNORMAL LOW (ref 26.0–34.0)
MCHC: 31.8 g/dL (ref 30.0–36.0)
MCV: 80.7 fL (ref 80.0–100.0)
Platelets: 253 10*3/uL (ref 150–400)
RBC: 4.91 MIL/uL (ref 3.87–5.11)
RDW: 13.1 % (ref 11.5–15.5)
WBC: 9.2 10*3/uL (ref 4.0–10.5)
nRBC: 0 % (ref 0.0–0.2)

## 2019-06-08 LAB — BASIC METABOLIC PANEL
Anion gap: 10 (ref 5–15)
BUN: 7 mg/dL (ref 6–20)
CO2: 21 mmol/L — ABNORMAL LOW (ref 22–32)
Calcium: 8.5 mg/dL — ABNORMAL LOW (ref 8.9–10.3)
Chloride: 100 mmol/L (ref 98–111)
Creatinine, Ser: 0.61 mg/dL (ref 0.44–1.00)
GFR calc Af Amer: >60 mL/min (ref >60)
GFR calc non Af Amer: >60 mL/min (ref >60)
Glucose, Bld: 110 mg/dL — ABNORMAL HIGH (ref 70–99)
Potassium: 3.7 mmol/L (ref 3.5–5.1)
Sodium: 131 mmol/L — ABNORMAL LOW (ref 135–145)

## 2019-06-08 LAB — HEPATIC FUNCTION PANEL
ALT: 16 U/L (ref 0–44)
AST: 19 U/L (ref 15–41)
Albumin: 3.9 g/dL (ref 3.5–5.0)
Alkaline Phosphatase: 61 U/L (ref 38–126)
Bilirubin, Direct: 0.1 mg/dL (ref 0.0–0.2)
Indirect Bilirubin: 0.2 mg/dL — ABNORMAL LOW (ref 0.3–0.9)
Total Bilirubin: 0.3 mg/dL (ref 0.3–1.2)
Total Protein: 7.7 g/dL (ref 6.5–8.1)

## 2019-06-08 LAB — TROPONIN I
Troponin I: <0.03 ng/mL (ref <0.03)
Troponin I: <0.03 ng/mL (ref <0.03)

## 2019-06-08 LAB — POCT PREGNANCY, URINE: Preg Test, Ur: NEGATIVE

## 2019-06-08 LAB — LIPASE, BLOOD: Lipase: 24 U/L (ref 11–51)

## 2019-06-08 MED ORDER — FAMOTIDINE IN NACL 20-0.9 MG/50ML-% IV SOLN
20.0000 mg | Freq: Once | INTRAVENOUS | Status: AC
  Administered 2019-06-08: 20 mg via INTRAVENOUS
  Filled 2019-06-08: qty 50

## 2019-06-08 MED ORDER — FAMOTIDINE 20 MG PO TABS: 20.0000 mg | ORAL_TABLET | Freq: Two times a day (BID) | ORAL | 1 refills | Status: DC

## 2019-06-08 MED ORDER — ONDANSETRON 4 MG PO TBDP
ORAL_TABLET | ORAL | Status: AC
  Filled 2019-06-08: qty 1

## 2019-06-08 MED ORDER — SODIUM CHLORIDE 0.9% FLUSH: 3.0000 mL | Freq: Once | INTRAVENOUS | Status: DC

## 2019-06-08 MED ORDER — ONDANSETRON 4 MG PO TBDP
4.0000 mg | ORAL_TABLET | Freq: Once | ORAL | Status: AC | PRN
  Administered 2019-06-08: 4 mg via ORAL

## 2019-06-08 MED ORDER — ALUM & MAG HYDROXIDE-SIMETH 200-200-20 MG/5ML PO SUSP
30.0000 mL | Freq: Once | ORAL | Status: AC
  Administered 2019-06-08: 30 mL via ORAL
  Filled 2019-06-08: qty 30

## 2019-06-08 NOTE — ED Notes (Signed)
Attempted IV times two unsuccessful.

## 2019-06-08 NOTE — ED Notes (Signed)
Patient returned from US.

## 2019-06-08 NOTE — ED Notes (Signed)
ED Provider at bedside. 

## 2019-06-08 NOTE — ED Notes (Signed)
Patient transported to Ultrasound 

## 2019-06-08 NOTE — ED Triage Notes (Signed)
Patient seen here two weeks ago and diagnosed with acid reflux. Woke up this AM with bilateral shoulder pain. A few hours later she started having hot/cold sweats, central chest pain, radiation to neck. Reports pain in chest hurts to talk, breath and put clothes on. A&O x4

## 2019-06-08 NOTE — ED Provider Notes (Addendum)
[facility] Emergency Department Provider Note  ____________________________________________   I have reviewed the triage vital signs and the nursing notes. Where available I have reviewed prior notes and, if possible and indicated, outside hospital notes.    HISTORY  Chief Complaint Chest Pain    HPI Emily Eaton is a 42 y.o. female patient seen and evaluated during the coronavirus epidemic during a time with low staffing presents today complaining of epigastric abdominal discomfort, which is similar to multiple episodes she has had a reflux disease in the past.  She states that she did have spicy chicken wings yesterday.  Began this morning when she woke up a little bit and has been present since that time.  Is relieved by cold water.  Burping.  No other symptoms no melena no bright red blood per rectum.  Has been seen and diagnosed with reflux disease for this in the past.  Did not follow-up as an outpatient with anyone.  Was is to take antiacid's but is not taking them.  This is the same pain that she had several weeks ago.  Exertional symptoms nothing makes it better nothing makes it worse except for lying flat.  No pulmonary symptoms, no respiratory symptoms no pleuritic chest pain.  No vomiting no melena no bright red blood per rectum.  Did have a little bit of nausea with it.  No burning discomfort sometimes. Right upper quadrant pain no jaundice, no other alleviating or aggravating factors no other prior treatment.  Time this was here, she did get a GI cocktail she states that made the pain go away and she like another 1 of those.  Past Medical History:  Diagnosis Date  . Acute low back pain   . ADD (attention deficit disorder) without hyperactivity   . Anemia   . Chronic constipation   . Herpes simplex without complication   . History of ovarian cyst 04/2010  . Internal hemorrhoids   . Iron deficiency anemia due to chronic blood loss   . PMS  (premenstrual syndrome)   . Vitamin D deficiency     Patient Active Problem List   Diagnosis Date Noted  . Other neutropenia ([place]) 05/03/2019  . Pre-diabetes 07/25/2018  . Dyslipidemia 07/25/2018  . Mild major depression ([place]) 07/24/2018  . Tension headache 07/24/2018  . Stiffness of right hand joint 07/28/2015  . ADD (attention deficit hyperactivity disorder, inattentive type) 07/25/2015  . Pain in toe 07/25/2015  . Chronic constipation 07/25/2015  . Herpes 07/25/2015  . Hemorrhoids, internal 07/25/2015  . Obesity (BMI 30-39.9) 07/25/2015  . Vitamin D deficiency 07/25/2015  . Radiculitis of right cervical region 04/14/2015  . Epicondylitis elbow, medial 10/02/2013  . Entrapment of ulnar nerve 10/02/2013  . Impingement syndrome of shoulder 10/02/2013  . Ovarian cyst 11/15/2011    Past Surgical History:  Procedure Laterality Date  . ENDOMETRIAL BIOPSY    . NOVASURE ABLATION    . OVARIAN CYST REMOVAL     left  . TUBAL LIGATION    . WISDOM TOOTH EXTRACTION     x 1    Prior to Admission medications   Medication Sig Start Date End Date Taking? Authorizing Provider  Cholecalciferol (VITAMIN D) 50 MCG (2000 UT) CAPS Take 1 capsule (2,000 Units total) by mouth daily. 11/29/18   Steele Sizer, MD  cyclobenzaprine (FLEXERIL) 5 MG tablet Take 1 tablet (5 mg total) by mouth at bedtime. 07/24/18   Steele Sizer, MD  famotidine (PEPCID) 20 MG tablet Take  1 tablet (20 mg total) by mouth daily. 05/26/19 05/25/20  Nance Pear, MD  fluticasone Asencion Islam) 50 MCG/ACT nasal spray Place into the nose.    [provider]  hydrOXYzine (ATARAX/VISTARIL) 10 MG tablet Take 1-2 tablets (10-20 mg total) by mouth at bedtime. 05/03/19   Steele Sizer, MD  metroNIDAZOLE (FLAGYL) 500 MG tablet Take 1 tablet (500 mg total) by mouth 2 (two) times daily. 05/03/19   Sowles, Drue Stager, MD  polyethylene glycol powder (GLYCOLAX/MIRALAX) powder TAKE 17 GRAMS BY MOUTH 2 TIMES DAILY AS NEEDED. 10/26/17    Ancil Boozer, Drue Stager, MD  sucralfate (CARAFATE) 1 g tablet Take 1 tablet (1 g total) by mouth 4 (four) times daily. 05/26/19   Nance Pear, MD    Allergies Codeine  Family History  Problem Relation Age of Onset  . Diabetes Mother   . Depression Mother   . Hypertension Mother   . Kidney disease Mother   . Hyperlipidemia Mother   . Dementia Mother   . Stroke Mother        March 18, 2018 (currently in rehab)   . Hypertension Father   . Diabetes Father   . Liver disease Father   . Prostate cancer Father   . Asthma Brother   . Cancer Maternal Uncle        prostate  . Thrombocytopenia Son        ITP    Social History Social History   Tobacco Use  . Smoking status: Never Smoker  . Smokeless tobacco: Never Used  Substance Use Topics  . Alcohol use: Yes    Alcohol/week: 2.0 standard drinks    Types: 2 Standard drinks or equivalent per week    Comment: occassionally  . Drug use: No    Review of Systems Constitutional: No fever/chills Eyes: No visual changes. ENT: No sore throat. No stiff neck no neck pain Cardiovascular: Denies chest pain. Respiratory: Denies shortness of breath. Gastrointestinal:   no vomiting.  No diarrhea.  No constipation. Genitourinary: Negative for dysuria. Musculoskeletal: Negative lower extremity swelling Skin: Negative for rash. Neurological: Negative for severe headaches, focal weakness or numbness.   ____________________________________________   PHYSICAL EXAM:  VITAL SIGNS: ED Triage Vitals  Enc Vitals Group     BP 06/08/19 1559 136/89     Pulse Rate 06/08/19 1559 93     Resp 06/08/19 1830 (!) 23     Temp 06/08/19 1559 99.2 F (37.3 C)     Temp Source 06/08/19 1559 Oral     SpO2 06/08/19 1559 97 %     Weight 06/08/19 1559 230 lb (104.3 kg)     Height 06/08/19 1559 5\' 5"  (1.651 m)     Head Circumference --      Peak Flow --      Pain Score 06/08/19 1629 10     Pain Loc --      Pain Edu? --      Excl. in [place]? --      Constitutional: Alert and oriented. Well appearing and in no acute distress. Eyes: Conjunctivae are normal Head: Atraumatic HEENT: No congestion/rhinnorhea. Mucous membranes are moist.  Oropharynx non-erythematous Neck:   Nontender with no meningismus, no masses, no stridor Cardiovascular: Normal rate, regular rhythm. Grossly normal heart sounds.  Good peripheral circulation. Respiratory: Normal respiratory effort.  No retractions. Lungs CTAB. Abdominal: Soft and very slight epigastric tenderness to deep palpation which reproduces her pain.. No distention. No guarding no rebound Back:  There is no focal tenderness or  step off.  there is no midline tenderness there are no lesions noted. there is no CVA tenderness Musculoskeletal: No lower extremity tenderness, no upper extremity tenderness. No joint effusions, no DVT signs strong distal pulses no edema Neurologic:  Normal speech and language. No gross focal neurologic deficits are appreciated.  Skin:  Skin is warm, dry and intact. No rash noted. Psychiatric: Mood and affect are normal. Speech and behavior are normal.  ____________________________________________   LABS (all labs ordered are listed, but only abnormal results are displayed)  Labs Reviewed  BASIC METABOLIC PANEL - Abnormal; Notable for the following components:      Result Value   Sodium 131 (*)    CO2 21 (*)    Glucose, Bld 110 (*)    Calcium 8.5 (*)    All other components within normal limits  CBC - Abnormal; Notable for the following components:   MCH 25.7 (*)    All other components within normal limits  TROPONIN I  LIPASE, BLOOD  HEPATIC FUNCTION PANEL  TROPONIN I  POC URINE PREG, ED    Pertinent labs  results that were available during my care of the patient were reviewed by me and considered in my medical decision making (see chart for details). ____________________________________________  EKG  I personally interpreted any EKGs ordered by me or  triage Sinus rhythm, LAD noted, no acute ST elevation or depression, no significant change from prior no acute ischemia ____________________________________________  RADIOLOGY  Pertinent labs & imaging results that were available during my care of the patient were reviewed by me and considered in my medical decision making (see chart for details). If possible, patient and/or family made aware of any abnormal findings.  Dg Chest 2 View  Result Date: 06/08/2019 CLINICAL DATA:  Chest pain and nausea today. EXAM: CHEST - 2 VIEW COMPARISON:  05/25/2019 and prior radiographs FINDINGS: The cardiomediastinal silhouette is unremarkable. Mild bibasilar opacities are noted. Opacities on the LATERAL view within the anterior LOWER lung may represent airspace disease. There is no evidence of pulmonary edema, suspicious pulmonary nodule/mass, pleural effusion, or pneumothorax. No acute bony abnormalities are identified. IMPRESSION: Mild basilar opacities which may represent airspace disease/pneumonia and/or atelectasis. Electronically Signed   By: Margarette Canada M.D.   On: 06/08/2019 17:06   ____________________________________________    PROCEDURES  Procedure(s) performed: None  Procedures  Critical Care performed: None  ____________________________________________   INITIAL IMPRESSION / ASSESSMENT AND PLAN / ED COURSE  Pertinent labs & imaging results that were available during my care of the patient were reviewed by me and considered in my medical decision making (see chart for details).  Here with reassuring EKG reassuring troponin uninterrupted pain since this morning, very reproducible mild discomfort worse with spicy food, similar to multiple prior episodes not exertional, no pleuritic component no personal family history of PE DVT no leg swelling no recent travel nothing at this time to suggest ACS PE or dissection.  Nonetheless, we will obtain second cardiac enzymes.  Low suspicion for right  cholecystitis however I will obtain ultrasound of that area.  We will treat her with anti-acids as this seems to be most consistent with her chronic reflux issues and we will reassess.   ----------------------------------------- 9:29 PM on 06/08/2019 -----------------------------------------  Patient in no acute distress still with tenderness to abdomen only on deep palpation no indication of think at this time for CT nothing to suggest perforation, considering the patient's symptoms, medical history, and physical examination today, I have low  suspicion for cholecystitis or biliary pathology, pancreatitis, perforation or bowel obstruction, hernia, intra-abdominal abscess, AAA or dissection, volvulus or intussusception, mesenteric ischemia, ischemic gut, pyelonephritis or appendicitis.  This does appear to be a systemic and diet related issue I have stressed the need for antiacid's, avoiding of nonsteroidals, and follow-up with GI.  At this time, there does not appear to be clinical evidence to support the diagnosis of pulmonary embolus, dissection, myocarditis, endocarditis, pericarditis, pericardial tamponade, acute coronary syndrome, pneumothorax, pneumonia, or any other acute intrathoracic pathology that will require admission or acute intervention. Nor is there evidence of any significant intra-abdominal pathology causing this discomfort.  Chest x-ray noted I suspect this is atelectasis she is had no cough no shortness of breath, we will send an outpatient coronavirus.   ____________________________________________   FINAL CLINICAL IMPRESSION(S) / ED DIAGNOSES  Final diagnoses:  Epigastric abdominal pain      This chart was dictated using voice recognition software.  Despite best efforts to proofread,  errors can occur which can change meaning.      Schuyler Amor, MD 06/08/19 [place] Drop    Schuyler Amor, MD 06/08/19 2130

## 2019-06-11 DIAGNOSIS — R079 Chest pain, unspecified: Secondary | ICD-10-CM | POA: Diagnosis not present

## 2019-06-11 DIAGNOSIS — K219 Gastro-esophageal reflux disease without esophagitis: Secondary | ICD-10-CM | POA: Diagnosis not present

## 2019-06-11 LAB — NOVEL CORONAVIRUS, NAA (HOSP ORDER, SEND-OUT TO REF LAB; TAT 18-24 HRS): SARS-CoV-2, NAA: NOT DETECTED

## 2019-06-13 DIAGNOSIS — R0602 Shortness of breath: Secondary | ICD-10-CM | POA: Diagnosis not present

## 2019-06-13 DIAGNOSIS — R079 Chest pain, unspecified: Secondary | ICD-10-CM | POA: Diagnosis not present

## 2019-06-24 DIAGNOSIS — R0602 Shortness of breath: Secondary | ICD-10-CM | POA: Diagnosis not present

## 2019-06-24 DIAGNOSIS — R079 Chest pain, unspecified: Secondary | ICD-10-CM | POA: Diagnosis not present

## 2019-07-02 DIAGNOSIS — R079 Chest pain, unspecified: Secondary | ICD-10-CM | POA: Diagnosis not present

## 2019-07-02 DIAGNOSIS — R0602 Shortness of breath: Secondary | ICD-10-CM | POA: Diagnosis not present

## 2019-08-05 ENCOUNTER — Encounter: Payer: BLUE CROSS/BLUE SHIELD | Admitting: Family Medicine

## 2019-09-11 ENCOUNTER — Encounter: Payer: Self-pay | Admitting: Family Medicine

## 2019-09-11 ENCOUNTER — Other Ambulatory Visit (HOSPITAL_COMMUNITY)
Admission: RE | Admit: 2019-09-11 | Discharge: 2019-09-11 | Disposition: A | Discharge status: Home / Self Care | Payer: BC Managed Care – PPO | Source: Ambulatory Visit | Attending: Family Medicine | Admitting: Family Medicine

## 2019-09-11 ENCOUNTER — Other Ambulatory Visit: Payer: Self-pay

## 2019-09-11 ENCOUNTER — Ambulatory Visit (INDEPENDENT_AMBULATORY_CARE_PROVIDER_SITE_OTHER): Payer: BC Managed Care – PPO | Admitting: Family Medicine

## 2019-09-11 VITALS — BP 122/74 | HR 73 | Temp 98.1°F | Resp 16 | Ht 64.0 in | Wt 224.5 lb

## 2019-09-11 DIAGNOSIS — Z23 Encounter for immunization: Secondary | ICD-10-CM

## 2019-09-11 DIAGNOSIS — Z0000 Encounter for general adult medical examination without abnormal findings: Secondary | ICD-10-CM | POA: Diagnosis not present

## 2019-09-11 DIAGNOSIS — R7303 Prediabetes: Secondary | ICD-10-CM

## 2019-09-11 DIAGNOSIS — R3915 Urgency of urination: Secondary | ICD-10-CM | POA: Diagnosis not present

## 2019-09-11 DIAGNOSIS — Z1239 Encounter for other screening for malignant neoplasm of breast: Secondary | ICD-10-CM

## 2019-09-11 DIAGNOSIS — E559 Vitamin D deficiency, unspecified: Secondary | ICD-10-CM | POA: Diagnosis not present

## 2019-09-11 DIAGNOSIS — Z124 Encounter for screening for malignant neoplasm of cervix: Secondary | ICD-10-CM

## 2019-09-11 DIAGNOSIS — Z113 Encounter for screening for infections with a predominantly sexual mode of transmission: Secondary | ICD-10-CM | POA: Diagnosis not present

## 2019-09-11 DIAGNOSIS — K219 Gastro-esophageal reflux disease without esophagitis: Secondary | ICD-10-CM | POA: Insufficient documentation

## 2019-09-11 DIAGNOSIS — E785 Hyperlipidemia, unspecified: Secondary | ICD-10-CM

## 2019-09-11 NOTE — Progress Notes (Signed)
Name: Emily Eaton   MRN: [id number]    DOB: 08-Nov-1976   Date:09/11/2019       Progress Note  Subjective  Chief Complaint  Chief Complaint  Patient presents with  . Annual Exam    HPI  Patient presents for annual CPE.  Diet: still eats fast food, advised to plan her meals  Exercise: she tries to walk 10 k steps per day   USPSTF grade A and B recommendations    Office Visit from 09/11/2019 in [facility]  AUDIT-C Score  0     Depression: Phq 9 is  positive Depression screen [facility] 2/9 09/11/2019 05/03/2019 11/29/2018 08/30/2018 07/24/2018  Decreased Interest 0 _0 0  Down, Depressed, Hopeless _1 PHQ - 2 Score _2 Altered sleeping _3 Tired, decreased energy _4 Change in appetite _5 0 2  Feeling bad or failure about yourself  0 0 1 0 0  Trouble concentrating 1 0 1 0 2  Moving slowly or fidgety/restless 0 2 1 0 0  Suicidal thoughts 0 0 0 0 0  PHQ-9 Score _6 Difficult doing work/chores Not difficult at all Somewhat difficult Not difficult at all Somewhat difficult Very difficult    Hypertension: BP Readings from Last 3 Encounters:  09/11/19 122/74  06/08/19 132/90  05/26/19 (!) 145/81   Obesity: Wt Readings from Last 3 Encounters:  09/11/19 224 lb 8 oz (101.8 kg)  06/08/19 230 lb (104.3 kg)  05/25/19 235 lb (106.6 kg)   BMI Readings from Last 3 Encounters:  09/11/19 38.54 kg/m  06/08/19 38.27 kg/m  05/25/19 39.11 kg/m     Hep C Screening: today  STD testing and prevention (HIV/chl/gon/syphilis): today  Intimate partner violence:negative screen  Sexual History/Pain during Intercourse: not pain or vaginal discharge Menstrual History/LMP/Abnormal Bleeding: cycles are regular , LMP today  Incontinence Symptoms: she has urinary urgency going on for months now   Breast cancer:  - Last Mammogram: ordered placed  - BRCA gene screening: N/A  Osteoporosis Screening: discussed high calcium and vitamin  D   Cervical cancer screening: today   Skin cancer: discussed atypical lesions  Colorectal cancer: advised to check coverage with insurance   ECG:05/2019  Advanced Care Planning: A voluntary discussion about advance care planning including the explanation and discussion of advance directives.  Discussed health care proxy and Living will, and the patient was able to identify a health care proxy as ex-husband.  Patient does have a living will at present time.  Lipids: Lab Results  Component Value Date   CHOL 191 07/24/2018   CHOL 171 06/07/2017   CHOL 173 07/28/2015   Lab Results  Component Value Date   HDL 44 (L) 07/24/2018   HDL 41 (L) 06/07/2017   HDL 41 07/28/2015   Lab Results  Component Value Date   LDLCALC 134 (H) 07/24/2018   LDLCALC 121 (H) 06/07/2017   LDLCALC 121 (H) 07/28/2015   Lab Results  Component Value Date   TRIG 43 07/24/2018   TRIG 44 06/07/2017   TRIG 53 07/28/2015   Lab Results  Component Value Date   CHOLHDL 4.3 07/24/2018   CHOLHDL 4.2 06/07/2017   CHOLHDL 4.2 07/28/2015   No results found for: LDLDIRECT  Glucose: Glucose, Bld  Date Value Ref Range Status  06/08/2019 110 (H) 70 - 99 mg/dL  Final  05/26/2019 104 (H) 70 - 99 mg/dL Final  05/25/2019 93 70 - 99 mg/dL Final    Patient Active Problem List   Diagnosis Date Noted  . GERD (gastroesophageal reflux disease) 09/11/2019  . Other neutropenia ([place]) 05/03/2019  . Pre-diabetes 07/25/2018  . Dyslipidemia 07/25/2018  . Mild major depression ([place]) 07/24/2018  . Tension headache 07/24/2018  . Stiffness of right hand joint 07/28/2015  . ADD (attention deficit hyperactivity disorder, inattentive type) 07/25/2015  . Pain in toe 07/25/2015  . Chronic constipation 07/25/2015  . Herpes 07/25/2015  . Hemorrhoids, internal 07/25/2015  . Obesity (BMI 30-39.9) 07/25/2015  . Vitamin D deficiency 07/25/2015  . Radiculitis of right cervical region 04/14/2015  . Epicondylitis elbow, medial  10/02/2013  . Entrapment of ulnar nerve 10/02/2013  . Impingement syndrome of shoulder 10/02/2013  . Ovarian cyst 11/15/2011    Past Surgical History:  Procedure Laterality Date  . ENDOMETRIAL BIOPSY    . NOVASURE ABLATION    . OVARIAN CYST REMOVAL     left  . TUBAL LIGATION    . WISDOM TOOTH EXTRACTION     x 1    Family History  Problem Relation Age of Onset  . Diabetes Mother   . Depression Mother   . Hypertension Mother   . Kidney disease Mother   . Hyperlipidemia Mother   . Dementia Mother   . Stroke Mother        March 18, 2018 (currently in rehab)   . Hypertension Father   . Diabetes Father   . Liver disease Father   . Prostate cancer Father   . Asthma Brother   . Cancer Maternal Uncle        prostate  . Thrombocytopenia Son        ITP    Social History   Socioeconomic History  . Marital status: Divorced    Spouse name: Dorthula Rue  . Number of children: 2  . Years of education: 41  . Highest education level: Associate degree: occupational, Hotel manager, or vocational program  Occupational History  . Occupation: Pharmacist, hospital    CommentBuilding surveyor  . Occupation: Chemical engineer: [employer]  . Financial resource strain: Not hard at all  . Food insecurity    Worry: Never true    Inability: Never true  . Transportation needs    Medical: No    Non-medical: No  Tobacco Use  . Smoking status: Never Smoker  . Smokeless tobacco: Never Used  Substance and Sexual Activity  . Alcohol use: Yes    Alcohol/week: 2.0 standard drinks    Types: 2 Standard drinks or equivalent per week    Comment: occassionally  . Drug use: No  . Sexual activity: Yes    Partners: Male    Birth control/protection: Surgical    Comment: Tubaligation  Lifestyle  . Physical activity    Days per week: 0 days    Minutes per session: 0 min  . Stress: Rather much  Relationships  . Social connections    Talks on phone: More than three times a week    Gets  together: Twice a week    Attends religious service: Never    Active member of club or organization: No    Attends meetings of clubs or organizations: Never    Relationship status: Divorced  . Intimate partner violence    Fear of current or ex partner: No    Emotionally abused: No  Physically abused: No    Forced sexual activity: No  Other Topics Concern  . Not on file  Social History Narrative   Divorced July 5th, 2019, but they are still sexually active   Son is in the [employer] going to [place]  from 07/2018 to 06/2019   Mother in [place] after stroke in 2019   Father diagnosed with prostate cancer 04/2019     Current Outpatient Medications:  .  Cholecalciferol (VITAMIN D) 50 MCG (2000 UT) CAPS, Take 1 capsule (2,000 Units total) by mouth daily., Disp: 30 capsule, Rfl: 0 .  cyclobenzaprine (FLEXERIL) 5 MG tablet, Take 1 tablet (5 mg total) by mouth at bedtime., Disp: 30 tablet, Rfl: 2 .  famotidine (PEPCID) 20 MG tablet, Take 1 tablet (20 mg total) by mouth 2 (two) times daily., Disp: 60 tablet, Rfl: 1 .  fluticasone (FLONASE) 50 MCG/ACT nasal spray, Place into the nose., Disp: , Rfl:  .  hydrOXYzine (ATARAX/VISTARIL) 10 MG tablet, Take 1-2 tablets (10-20 mg total) by mouth at bedtime., Disp: 60 tablet, Rfl: 0 .  pantoprazole (PROTONIX) 40 MG tablet, Take by mouth., Disp: , Rfl:  .  polyethylene glycol powder (GLYCOLAX/MIRALAX) powder, TAKE 17 GRAMS BY MOUTH 2 TIMES DAILY AS NEEDED., Disp: 1020 g, Rfl: 2  Allergies  Allergen Reactions  . Codeine Itching and Rash     ROS  Constitutional: Negative for fever or weight change.  Respiratory: Negative for cough and shortness of breath.   Cardiovascular: Negative for chest pain or palpitations.  Gastrointestinal: Negative for abdominal pain, no bowel changes.  Musculoskeletal: Negative for gait problem or joint swelling.  Skin: Negative for rash.  Neurological: Negative for dizziness or headache.  No other specific  complaints in a complete review of systems (except as listed in HPI above).  Objective  Vitals:   09/11/19 1438  BP: 122/74  Pulse: 73  Resp: 16  Temp: 98.1 F (36.7 C)  TempSrc: Temporal  SpO2: 98%  Weight: 224 lb 8 oz (101.8 kg)  Height: _0  (1.626 m)    Body mass index is 38.54 kg/m.  Physical Exam  Constitutional: Patient appears well-developed and obese . No distress.  HENT: Head: Normocephalic and atraumatic. Ears: B TMs ok, no erythema or effusion; Nose: Nose normal. Mouth/Throat: Oropharynx is clear and moist. No oropharyngeal exudate.  Eyes: Conjunctivae and EOM are normal. Pupils are equal, round, and reactive to light. No scleral icterus.  Neck: Normal range of motion. Neck supple. No JVD present. No thyromegaly present.  Cardiovascular: Normal rate, regular rhythm and normal heart sounds.  No murmur heard. No BLE edema. Pulmonary/Chest: Effort normal and breath sounds normal. No respiratory distress. Abdominal: Soft. Bowel sounds are normal, no distension. There is no tenderness. no masses Breast: no lumps or masses, no nipple discharge or rashes FEMALE GENITALIA:  External genitalia normal External urethra normal Vaginal vault normal without discharge or lesions Cervix normal without discharge or lesions Bimanual exam normal without masses RECTAL: not done Musculoskeletal: Normal range of motion, no joint effusions. No gross deformities Neurological: he is alert and oriented to person, place, and time. No cranial nerve deficit. Coordination, balance, strength, speech and gait are normal.  Skin: Skin is warm and dry. No rash noted. No erythema. She has aging spots on friction areas, below breast and abdominal fold, scar from previous tubal ligation  Psychiatric: Patient has a normal mood and affect. behavior is normal. Judgment and thought content normal.   Fall Risk: Fall  Risk  09/11/2019 05/03/2019 11/29/2018 08/30/2018 07/24/2018  Falls in the past year? 0 0 0  No No  Number falls in past yr: 0 0 0 - -  Injury with Fall? 0 0 0 - -  Comment - - - - -    Functional Status Survey: Is the patient deaf or have difficulty hearing?: No Does the patient have difficulty seeing, even when wearing glasses/contacts?: Yes(vision) Does the patient have difficulty concentrating, remembering, or making decisions?: No Does the patient have difficulty walking or climbing stairs?: No Does the patient have difficulty dressing or bathing?: No Does the patient have difficulty doing errands alone such as visiting a doctor's office or shopping?: No   Assessment & Plan  1. Needs flu shot  refused  2. Cervical cancer screening  - Cytology - PAP  3. Breast cancer screening  - MM 3D SCREEN BREAST BILATERAL; Future  4. Routine screening for STI (sexually transmitted infection)  - HIV Antibody (routine testing w rflx) - RPR - Hepatitis panel, acute  5. Vitamin D deficiency  - VITAMIN D 25 Hydroxy (Vit-D Deficiency, Fractures)  6. Pre-diabetes  - Hemoglobin A1c  7. Dyslipidemia  - Lipid panel  8. Well adult exam  - MM 3D SCREEN BREAST BILATERAL; Future - Cytology - PAP - HIV Antibody (routine testing w rflx) - RPR - Hepatitis panel, acute - Lipid panel - Hemoglobin A1c - VITAMIN D 25 Hydroxy (Vit-D Deficiency, Fractures)   -USPSTF grade A and B recommendations reviewed with patient; age-appropriate recommendations, preventive care, screening tests, etc discussed and encouraged; healthy living encouraged; see AVS for patient education given to patient -Discussed importance of 150 minutes of physical activity weekly, eat two servings of fish weekly, eat one serving of tree nuts ( cashews, pistachios, pecans, almonds.Marland Kitchen) every other day, eat 6 servings of fruit/vegetables daily and drink plenty of water and avoid sweet beverages.

## 2019-09-11 NOTE — Patient Instructions (Signed)

## 2019-09-13 LAB — LIPID PANEL
Cholesterol: 166 mg/dL (ref <200)
HDL: 41 mg/dL — ABNORMAL LOW (ref >=50)
LDL Cholesterol (Calc): 109 mg/dL (calc) — ABNORMAL HIGH
Non-HDL Cholesterol (Calc): 125 mg/dL (calc) (ref <130)
Total CHOL/HDL Ratio: 4 (calc) (ref <5.0)
Triglycerides: 74 mg/dL (ref <150)

## 2019-09-13 LAB — CULTURE, URINE COMPREHENSIVE
MICRO NUMBER:: 915500
RESULT:: NO GROWTH
SPECIMEN QUALITY:: ADEQUATE

## 2019-09-13 LAB — CYTOLOGY - PAP
Chlamydia: NEGATIVE
Diagnosis: NEGATIVE
Molecular Disclaimer: NEGATIVE
Molecular Disclaimer: NEGATIVE
Molecular Disclaimer: NORMAL
Neisseria Gonorrhea: NEGATIVE
Trichomonas: NEGATIVE

## 2019-09-13 LAB — HEMOGLOBIN A1C
Hgb A1c MFr Bld: 5.6 % of total Hgb (ref <5.7)
Mean Plasma Glucose: 114 (calc)
eAG (mmol/L): 6.3 (calc)

## 2019-09-13 LAB — RPR: RPR Ser Ql: NONREACTIVE

## 2019-09-13 LAB — VITAMIN D 25 HYDROXY (VIT D DEFICIENCY, FRACTURES): Vit D, 25-Hydroxy: 22 ng/mL — ABNORMAL LOW (ref 30–100)

## 2019-09-13 LAB — HIV ANTIBODY (ROUTINE TESTING W REFLEX): HIV 1&2 Ab, 4th Generation: NONREACTIVE

## 2019-10-20 DIAGNOSIS — Z8616 Personal history of COVID-19: Secondary | ICD-10-CM | POA: Insufficient documentation

## 2019-11-04 ENCOUNTER — Ambulatory Visit
Admission: EM | Admit: 2019-11-04 | Discharge: 2019-11-04 | Disposition: A | Discharge status: Home / Self Care | Payer: BC Managed Care – PPO | Attending: Emergency Medicine | Admitting: Emergency Medicine

## 2019-11-04 ENCOUNTER — Encounter: Payer: Self-pay | Admitting: Emergency Medicine

## 2019-11-04 ENCOUNTER — Ambulatory Visit
Admission: RE | Admit: 2019-11-04 | Discharge: 2019-11-04 | Disposition: A | Discharge status: Home / Self Care | Payer: BC Managed Care – PPO | Attending: Emergency Medicine | Admitting: Emergency Medicine

## 2019-11-04 ENCOUNTER — Ambulatory Visit
Admission: RE | Admit: 2019-11-04 | Discharge: 2019-11-04 | Disposition: A | Discharge status: Home / Self Care | Payer: BC Managed Care – PPO | Source: Ambulatory Visit | Attending: Emergency Medicine | Admitting: Emergency Medicine

## 2019-11-04 ENCOUNTER — Other Ambulatory Visit: Payer: Self-pay

## 2019-11-04 DIAGNOSIS — M7732 Calcaneal spur, left foot: Secondary | ICD-10-CM | POA: Insufficient documentation

## 2019-11-04 DIAGNOSIS — M79672 Pain in left foot: Secondary | ICD-10-CM

## 2019-11-04 DIAGNOSIS — W208XXA Other cause of strike by thrown, projected or falling object, initial encounter: Secondary | ICD-10-CM | POA: Diagnosis not present

## 2019-11-04 DIAGNOSIS — R2242 Localized swelling, mass and lump, left lower limb: Secondary | ICD-10-CM | POA: Diagnosis not present

## 2019-11-04 DIAGNOSIS — M7989 Other specified soft tissue disorders: Secondary | ICD-10-CM | POA: Insufficient documentation

## 2019-11-04 MED ORDER — IBUPROFEN 800 MG PO TABS: 800.0000 mg | ORAL_TABLET | Freq: Three times a day (TID) | ORAL | 0 refills | Status: DC | PRN

## 2019-11-04 NOTE — ED Triage Notes (Signed)
Patient in today c/o left foot pain after dropping a computer on the top of her left foot yesterday.

## 2019-11-04 NOTE — ED Provider Notes (Signed)
Emily Eaton    CSN: [id number] Arrival date & time: 11/04/19  1119      History   Chief Complaint Chief Complaint  Patient presents with   Foot Injury    DOI 11/03/19 left    HPI Emily Eaton is a 43 y.o. female.   Patient presents with pain and swelling in the top of her left foot after dropping a computer on it yesterday.  No open wounds.  She denies numbness, tingling, weakness.  Treatment attempted at home with rest and elevation which has provided moderate relief.  LMP: 10/21/2019.  The history is provided by the patient.    Past Medical History:  Diagnosis Date   Acute low back pain    ADD (attention deficit disorder) without hyperactivity    Anemia    Chronic constipation    Herpes simplex without complication    History of ovarian cyst 04/2010   Internal hemorrhoids    Iron deficiency anemia due to chronic blood loss    PMS (premenstrual syndrome)    Vitamin D deficiency     Patient Active Problem List   Diagnosis Date Noted   GERD (gastroesophageal reflux disease) 09/11/2019   Other neutropenia ([place]) 05/03/2019   Pre-diabetes 07/25/2018   Dyslipidemia 07/25/2018   Mild major depression ([place]) 07/24/2018   Tension headache 07/24/2018   Stiffness of right hand joint 07/28/2015   ADD (attention deficit hyperactivity disorder, inattentive type) 07/25/2015   Pain in toe 07/25/2015   Chronic constipation 07/25/2015   Herpes 07/25/2015   Hemorrhoids, internal 07/25/2015   Obesity (BMI 30-39.9) 07/25/2015   Vitamin D deficiency 07/25/2015   Radiculitis of right cervical region 04/14/2015   Epicondylitis elbow, medial 10/02/2013   Entrapment of ulnar nerve 10/02/2013   Impingement syndrome of shoulder 10/02/2013   Ovarian cyst 11/15/2011    Past Surgical History:  Procedure Laterality Date   ENDOMETRIAL BIOPSY     NOVASURE ABLATION     OVARIAN CYST REMOVAL     left   TUBAL LIGATION     WISDOM TOOTH  EXTRACTION     x 1    OB History    Gravida  2   Para      Term      Preterm      AB      Living  2     SAB      TAB      Ectopic      Multiple      Live Births  2            Home Medications    Prior to Admission medications   Medication Sig Start Date End Date Taking? Authorizing Provider  Cholecalciferol (VITAMIN D) 50 MCG (2000 UT) CAPS Take 1 capsule (2,000 Units total) by mouth daily. 11/29/18  Yes Sowles, Drue Stager, MD  cyclobenzaprine (FLEXERIL) 5 MG tablet Take 1 tablet (5 mg total) by mouth at bedtime. 07/24/18  Yes Sowles, Drue Stager, MD  famotidine (PEPCID) 20 MG tablet Take 1 tablet (20 mg total) by mouth 2 (two) times daily. 06/08/19 06/07/20 Yes Schuyler Amor, MD  fluticasone (FLONASE) 50 MCG/ACT nasal spray Place into the nose.   Yes [provider]  hydrOXYzine (ATARAX/VISTARIL) 10 MG tablet Take 1-2 tablets (10-20 mg total) by mouth at bedtime. 05/03/19  Yes Sowles, Drue Stager, MD  pantoprazole (PROTONIX) 40 MG tablet Take by mouth. 06/11/19  Yes [provider]  polyethylene glycol powder (GLYCOLAX/MIRALAX) powder TAKE 17  GRAMS BY MOUTH 2 TIMES DAILY AS NEEDED. 10/26/17  Yes Sowles, Drue Stager, MD  ibuprofen (ADVIL) 800 MG tablet Take 1 tablet (800 mg total) by mouth every 8 (eight) hours as needed. 11/04/19   Sharion Balloon, NP    Family History Family History  Problem Relation Age of Onset   Diabetes Mother    Depression Mother    Hypertension Mother    Kidney disease Mother    Hyperlipidemia Mother    Dementia Mother    Stroke Mother        March 18, 2018 (currently in rehab)    Hypertension Father    Diabetes Father    Liver disease Father    Prostate cancer Father    Asthma Brother    Cancer Maternal Uncle        prostate   Thrombocytopenia Son        ITP    Social History Social History   Tobacco Use   Smoking status: Never Smoker   Smokeless tobacco: Never Used  Substance Use Topics   Alcohol  use: Yes    Alcohol/week: 2.0 standard drinks    Types: 2 Standard drinks or equivalent per week    Comment: occassionally   Drug use: No     Allergies   Codeine   Review of Systems Review of Systems  Constitutional: Negative for chills and fever.  HENT: Negative for ear pain and sore throat.   Eyes: Negative for pain and visual disturbance.  Respiratory: Negative for cough and shortness of breath.   Cardiovascular: Negative for chest pain and palpitations.  Gastrointestinal: Negative for abdominal pain and vomiting.  Genitourinary: Negative for dysuria and hematuria.  Musculoskeletal: Positive for arthralgias. Negative for back pain.  Skin: Negative for color change and rash.  Neurological: Negative for seizures and syncope.  All other systems reviewed and are negative.    Physical Exam Triage Vital Signs ED Triage Vitals  Enc Vitals Group     BP 11/04/19 1133 125/89     Pulse Rate 11/04/19 1133 88     Resp 11/04/19 1133 18     Temp 11/04/19 1133 98.8 F (37.1 C)     Temp Source 11/04/19 1133 Oral     SpO2 11/04/19 1133 96 %     Weight --      Height --      Head Circumference --      Peak Flow --      Pain Score 11/04/19 1134 7     Pain Loc --      Pain Edu? --      Excl. in [place]? --    No data found.  Updated Vital Signs BP 125/89 (BP Location: Left Arm)    Pulse 88    Temp 98.8 F (37.1 C) (Oral)    Resp 18    Ht 5\' 6"  (1.676 m)    Wt 223 lb (101.2 kg)    LMP 10/21/2019 (Exact Date)    SpO2 96%    BMI 35.99 kg/m   Visual Acuity Right Eye Distance:   Left Eye Distance:   Bilateral Distance:    Right Eye Near:   Left Eye Near:    Bilateral Near:     Physical Exam Vitals signs and nursing note reviewed.  Constitutional:      General: She is not in acute distress.    Appearance: She is well-developed.  HENT:     Head: Normocephalic and atraumatic.  Mouth/Throat:     Mouth: Mucous membranes are moist.     Pharynx: Oropharynx is clear.  Eyes:       Conjunctiva/sclera: Conjunctivae normal.  Neck:     Musculoskeletal: Neck supple.  Cardiovascular:     Rate and Rhythm: Normal rate and regular rhythm.     Heart sounds: No murmur.  Pulmonary:     Effort: Pulmonary effort is normal. No respiratory distress.     Breath sounds: Normal breath sounds.  Abdominal:     Palpations: Abdomen is soft.     Tenderness: There is no abdominal tenderness. There is no guarding or rebound.  Musculoskeletal: Normal range of motion.        General: Swelling and tenderness present.       Feet:  Skin:    General: Skin is warm and dry.     Capillary Refill: Capillary refill takes less than 2 seconds.     Findings: Bruising present.  Neurological:     General: No focal deficit present.     Mental Status: She is alert and oriented to person, place, and time.     Sensory: No sensory deficit.     Motor: No weakness.  Psychiatric:        Mood and Affect: Mood normal.        Behavior: Behavior normal.      UC Treatments / Results  Labs (all labs ordered are listed, but only abnormal results are displayed) Labs Reviewed - No data to display  EKG   Radiology Dg Foot Complete Left  Result Date: 11/04/2019 CLINICAL DATA:  Dropped heavy object on foot EXAM: LEFT FOOT - COMPLETE 3+ VIEW COMPARISON:  None. FINDINGS: Frontal, oblique, and lateral views were obtained. There is soft tissue swelling over the dorsum of the mid and distal aspects of the foot. There is no appreciable acute fracture or dislocation. There is mild osteoarthritic change in the first MTP joint. There is a questionable erosion in the distal metatarsal slightly proximal to the joint space. There is also a subtle erosion along the medial aspect of the proximal most aspect of the first proximal phalanx. No similar erosive change elsewhere. There is a small inferior calcaneal spur. IMPRESSION: 1.  Soft tissue swelling dorsally. 2.  No fracture or dislocation. 3. Narrowing of the first  MTP joint with apparent erosions raising concern for underlying gout. Other joint spaces appear unremarkable. 4.  Small inferior calcaneal spur. Electronically Signed   By: Lowella Grip III M.D.   On: 11/04/2019 12:31    Procedures Procedures (including critical care time)  Medications Ordered in UC Medications - No data to display  Initial Impression / Assessment and Plan / UC Course  I have reviewed the triage vital signs and the nursing notes.  Pertinent labs & imaging results that were available during my care of the patient were reviewed by me and considered in my medical decision making (see chart for details).   Left foot pain.  Outpatient xray of foot done; no fracture.  Treating with ibuprofen, rest, elevation, ice, ace wrap, post-op shoe.  Instructed patient to follow-up with her PCP or an orthopedist if her symptoms persist or worsen.  Patient agrees to plan of care.   Final Clinical Impressions(s) / UC Diagnoses   Final diagnoses:  Left foot pain     Discharge Instructions     Go to [facility] to have an xray of your foot.  We will call  you with the results.    Take the ibuprofen as prescribed.  Rest and elevate your foot.  Apply ice packs 2-3 times a day for up to 20 minutes each.  Wear the Ace wrap and post-op shoe as needed for comfort.    Follow up with the orthopedist listed below or your own orthopedist if you symptoms continue or worsen;  Or if you develop new symptoms, such as numbness, tingling, or weakness.         ED Prescriptions    Medication Sig Dispense Auth. Provider   ibuprofen (ADVIL) 800 MG tablet Take 1 tablet (800 mg total) by mouth every 8 (eight) hours as needed. 21 tablet Sharion Balloon, NP     PDMP not reviewed this encounter.   Sharion Balloon, NP 11/04/19 1239

## 2019-11-04 NOTE — Discharge Instructions (Addendum)
Go to [facility] to have an xray of your foot.  We will call you with the results.    Take the ibuprofen as prescribed.  Rest and elevate your foot.  Apply ice packs 2-3 times a day for up to 20 minutes each.  Wear the Ace wrap and post-op shoe as needed for comfort.    Follow up with the orthopedist listed below or your own orthopedist if you symptoms continue or worsen;  Or if you develop new symptoms, such as numbness, tingling, or weakness.

## 2019-11-10 DIAGNOSIS — U071 COVID-19: Secondary | ICD-10-CM | POA: Diagnosis not present

## 2019-11-12 ENCOUNTER — Telehealth: Payer: BC Managed Care – PPO | Admitting: Nurse Practitioner

## 2019-11-12 DIAGNOSIS — R5081 Fever presenting with conditions classified elsewhere: Secondary | ICD-10-CM

## 2019-11-12 DIAGNOSIS — R059 Cough, unspecified: Secondary | ICD-10-CM

## 2019-11-12 DIAGNOSIS — R05 Cough: Secondary | ICD-10-CM

## 2019-11-12 DIAGNOSIS — U071 COVID-19: Secondary | ICD-10-CM

## 2019-11-12 DIAGNOSIS — R0602 Shortness of breath: Secondary | ICD-10-CM

## 2019-11-12 MED ORDER — PREDNISONE 20 MG PO TABS: ORAL_TABLET | ORAL | 0 refills | Status: DC

## 2019-11-12 MED ORDER — AZITHROMYCIN 250 MG PO TABS: ORAL_TABLET | ORAL | 0 refills | Status: DC

## 2019-11-12 MED ORDER — BENZONATATE 100 MG PO CAPS: 100.0000 mg | ORAL_CAPSULE | Freq: Three times a day (TID) | ORAL | 0 refills | Status: DC | PRN

## 2019-11-12 NOTE — Progress Notes (Signed)
.E-Visit for Corona Virus Screening   Your current symptoms could be consistent with the coronavirus.  Many health care providers can now test patients at their office but not all are.   Please quarantine yourself while awaiting your test results.  We are enrolling you in our [facility] [place] . Daily you will receive a questionnaire within the [place] website. Our COVID 19 response team willl be monitoriing your responses daily. Please continue good preventive care measures, including:  frequent hand-washing, avoid touching your face, cover coughs/sneezes, stay out of crowds and keep a 6 foot distance from others.    COVID-19 is a respiratory illness with symptoms that are similar to the flu. Symptoms are typically mild to moderate, but there have been cases of severe illness and death due to the virus. The following symptoms may appear 2-14 days after exposure: . Fever . Cough . Shortness of breath or difficulty breathing . Chills . Repeated shaking with chills . Muscle pain . Headache . Sore throat . New loss of taste or smell . Fatigue . Congestion or runny nose . Nausea or vomiting . Diarrhea  If you develop fever/cough/breathlessness, please stay home for 10 days with improving symptoms and until you have had 24 hours of no fever (without taking a fever reducer).  Go to the nearest hospital ED for assessment if fever/cough/breathlessness are severe or illness seems like a threat to life.  It is vitally important that if you feel that you have an infection such as this virus or any other virus that you stay home and away from places where you may spread it to others.  You should avoid contact with people age 21 and older.   You should wear a mask or cloth face covering over your nose and mouth if you must be around other people or animals, including pets (even at home). Try to stay at least 6 feet away from other people. This will protect the people around you. Since you  have tested positive and still have high fever I am rx the folowing:  You can use medication such as A prescription cough medication called Tessalon Perles 100 mg. You may take 1-2 capsules every 8 hours as needed for cough. zpak for infection to take ass directed, as well as prednisone 20mg  2 tablets daily for 5 days.  You may also take acetaminophen (Tylenol) as needed for fever.   Reduce your risk of any infection by using the same precautions used for avoiding the common cold or flu:  Marland Kitchen Wash your hands often with soap and warm water for at least 20 seconds.  If soap and water are not readily available, use an alcohol-based hand sanitizer with at least 60% alcohol.  . If coughing or sneezing, cover your mouth and nose by coughing or sneezing into the elbow areas of your shirt or coat, into a tissue or into your sleeve (not your hands). . Avoid shaking hands with others and consider head nods or verbal greetings only. . Avoid touching your eyes, nose, or mouth with unwashed hands.  . Avoid close contact with people who are sick. . Avoid places or events with large numbers of people in one location, like concerts or sporting events. . Carefully consider travel plans you have or are making. . If you are planning any travel outside or inside the [place], visit the CDC's Travelers' Health webpage for the latest health notices. . If you have some symptoms but not all symptoms,  continue to monitor at home and seek medical attention if your symptoms worsen. . If you are having a medical emergency, call 911.  HOME CARE . Only take medications as instructed by your medical team. . Drink plenty of fluids and get plenty of rest. . A steam or ultrasonic humidifier can help if you have congestion.   GET HELP RIGHT AWAY IF YOU HAVE EMERGENCY WARNING SIGNS** FOR COVID-19. If you or someone is showing any of these signs seek emergency medical care immediately. Call 911 or proceed to your closest emergency  facility if: . You develop worsening high fever. . Trouble breathing . Bluish lips or face . Persistent pain or pressure in the chest . New confusion . Inability to wake or stay awake . You cough up blood. . Your symptoms become more severe  **This list is not all possible symptoms. Contact your medical provider for any symptoms that are sever or concerning to you.   MAKE SURE YOU   Understand these instructions.  Will watch your condition.  Will get help right away if you are not doing well or get worse.  Your e-visit answers were reviewed by a board certified advanced clinical practitioner to complete your personal care plan.  Depending on the condition, your plan could have included both over the counter or prescription medications.  If there is a problem please reply once you have received a response from your provider.  Your safety is important to [place].  If you have drug allergies check your prescription carefully.    You can use MyChart to ask questions about today's visit, request a non-urgent call back, or ask for a work or school excuse for 24 hours related to this e-Visit. If it has been greater than 24 hours you will need to follow up with your provider, or enter a new e-Visit to address those concerns. You will get an e-mail in the next two days asking about your experience.  I hope that your e-visit has been valuable and will speed your recovery. Thank you for using e-visits.   5-10 minutes spent reviewing and documenting in chart.

## 2019-11-13 ENCOUNTER — Ambulatory Visit: Payer: BC Managed Care – PPO | Admitting: Family Medicine

## 2019-11-14 ENCOUNTER — Emergency Department: Payer: BC Managed Care – PPO

## 2019-11-14 ENCOUNTER — Inpatient Hospital Stay
Admission: EM | Admit: 2019-11-14 | Discharge: 2019-11-18 | DRG: 177 | Disposition: A | Discharge status: Home / Self Care | Payer: BC Managed Care – PPO | Attending: Internal Medicine | Admitting: Internal Medicine

## 2019-11-14 ENCOUNTER — Other Ambulatory Visit: Payer: Self-pay

## 2019-11-14 ENCOUNTER — Encounter: Payer: Self-pay | Admitting: Emergency Medicine

## 2019-11-14 DIAGNOSIS — J1282 Pneumonia due to coronavirus disease 2019: Secondary | ICD-10-CM | POA: Diagnosis present

## 2019-11-14 DIAGNOSIS — U071 COVID-19: Principal | ICD-10-CM | POA: Diagnosis present

## 2019-11-14 DIAGNOSIS — M545 Low back pain: Secondary | ICD-10-CM | POA: Diagnosis not present

## 2019-11-14 DIAGNOSIS — D500 Iron deficiency anemia secondary to blood loss (chronic): Secondary | ICD-10-CM | POA: Diagnosis present

## 2019-11-14 DIAGNOSIS — Z79899 Other long term (current) drug therapy: Secondary | ICD-10-CM | POA: Diagnosis not present

## 2019-11-14 DIAGNOSIS — Z8042 Family history of malignant neoplasm of prostate: Secondary | ICD-10-CM

## 2019-11-14 DIAGNOSIS — Z8349 Family history of other endocrine, nutritional and metabolic diseases: Secondary | ICD-10-CM

## 2019-11-14 DIAGNOSIS — F988 Other specified behavioral and emotional disorders with onset usually occurring in childhood and adolescence: Secondary | ICD-10-CM | POA: Diagnosis not present

## 2019-11-14 DIAGNOSIS — Z8249 Family history of ischemic heart disease and other diseases of the circulatory system: Secondary | ICD-10-CM

## 2019-11-14 DIAGNOSIS — E559 Vitamin D deficiency, unspecified: Secondary | ICD-10-CM | POA: Diagnosis present

## 2019-11-14 DIAGNOSIS — K5909 Other constipation: Secondary | ICD-10-CM | POA: Diagnosis present

## 2019-11-14 DIAGNOSIS — Z825 Family history of asthma and other chronic lower respiratory diseases: Secondary | ICD-10-CM | POA: Diagnosis not present

## 2019-11-14 DIAGNOSIS — D72810 Lymphocytopenia: Secondary | ICD-10-CM

## 2019-11-14 DIAGNOSIS — Z818 Family history of other mental and behavioral disorders: Secondary | ICD-10-CM | POA: Diagnosis not present

## 2019-11-14 DIAGNOSIS — R0602 Shortness of breath: Secondary | ICD-10-CM | POA: Diagnosis not present

## 2019-11-14 DIAGNOSIS — J189 Pneumonia, unspecified organism: Secondary | ICD-10-CM | POA: Diagnosis not present

## 2019-11-14 DIAGNOSIS — N83209 Unspecified ovarian cyst, unspecified side: Secondary | ICD-10-CM | POA: Diagnosis not present

## 2019-11-14 DIAGNOSIS — Z7951 Long term (current) use of inhaled steroids: Secondary | ICD-10-CM | POA: Diagnosis not present

## 2019-11-14 DIAGNOSIS — R0689 Other abnormalities of breathing: Secondary | ICD-10-CM | POA: Diagnosis not present

## 2019-11-14 DIAGNOSIS — R0902 Hypoxemia: Secondary | ICD-10-CM | POA: Diagnosis not present

## 2019-11-14 DIAGNOSIS — R531 Weakness: Secondary | ICD-10-CM | POA: Diagnosis not present

## 2019-11-14 DIAGNOSIS — Z823 Family history of stroke: Secondary | ICD-10-CM | POA: Diagnosis not present

## 2019-11-14 DIAGNOSIS — J1289 Other viral pneumonia: Secondary | ICD-10-CM | POA: Diagnosis not present

## 2019-11-14 DIAGNOSIS — Z833 Family history of diabetes mellitus: Secondary | ICD-10-CM | POA: Diagnosis not present

## 2019-11-14 DIAGNOSIS — J9601 Acute respiratory failure with hypoxia: Secondary | ICD-10-CM

## 2019-11-14 DIAGNOSIS — R718 Other abnormality of red blood cells: Secondary | ICD-10-CM | POA: Diagnosis not present

## 2019-11-14 DIAGNOSIS — Z841 Family history of disorders of kidney and ureter: Secondary | ICD-10-CM | POA: Diagnosis not present

## 2019-11-14 DIAGNOSIS — Z791 Long term (current) use of non-steroidal anti-inflammatories (NSAID): Secondary | ICD-10-CM | POA: Diagnosis not present

## 2019-11-14 DIAGNOSIS — R918 Other nonspecific abnormal finding of lung field: Secondary | ICD-10-CM | POA: Diagnosis not present

## 2019-11-14 LAB — COMPREHENSIVE METABOLIC PANEL
ALT: 11 U/L (ref 0–44)
AST: 17 U/L (ref 15–41)
Albumin: 3.4 g/dL — ABNORMAL LOW (ref 3.5–5.0)
Alkaline Phosphatase: 50 U/L (ref 38–126)
Anion gap: 10 (ref 5–15)
BUN: 6 mg/dL (ref 6–20)
CO2: 23 mmol/L (ref 22–32)
Calcium: 8.8 mg/dL — ABNORMAL LOW (ref 8.9–10.3)
Chloride: 104 mmol/L (ref 98–111)
Creatinine, Ser: 0.7 mg/dL (ref 0.44–1.00)
GFR calc Af Amer: >60 mL/min (ref >60)
GFR calc non Af Amer: >60 mL/min (ref >60)
Glucose, Bld: 108 mg/dL — ABNORMAL HIGH (ref 70–99)
Potassium: 3.9 mmol/L (ref 3.5–5.1)
Sodium: 137 mmol/L (ref 135–145)
Total Bilirubin: 0.5 mg/dL (ref 0.3–1.2)
Total Protein: 7.8 g/dL (ref 6.5–8.1)

## 2019-11-14 LAB — CBC WITH DIFFERENTIAL/PLATELET
Abs Immature Granulocytes: 0.01 10*3/uL (ref 0.00–0.07)
Basophils Absolute: 0 10*3/uL (ref 0.0–0.1)
Basophils Relative: 0 %
Eosinophils Absolute: 0 10*3/uL (ref 0.0–0.5)
Eosinophils Relative: 0 %
HCT: 37.7 % (ref 36.0–46.0)
Hemoglobin: 12 g/dL (ref 12.0–15.0)
Immature Granulocytes: 0 %
Lymphocytes Relative: 18 %
Lymphs Abs: 0.6 10*3/uL — ABNORMAL LOW (ref 0.7–4.0)
MCH: 25.1 pg — ABNORMAL LOW (ref 26.0–34.0)
MCHC: 31.8 g/dL (ref 30.0–36.0)
MCV: 78.7 fL — ABNORMAL LOW (ref 80.0–100.0)
Monocytes Absolute: 0.2 10*3/uL (ref 0.1–1.0)
Monocytes Relative: 5 %
Neutro Abs: 2.5 10*3/uL (ref 1.7–7.7)
Neutrophils Relative %: 77 %
Platelets: 214 10*3/uL (ref 150–400)
RBC: 4.79 MIL/uL (ref 3.87–5.11)
RDW: 12.9 % (ref 11.5–15.5)
WBC: 3.3 10*3/uL — ABNORMAL LOW (ref 4.0–10.5)
nRBC: 0 % (ref 0.0–0.2)

## 2019-11-14 LAB — FIBRIN DERIVATIVES D-DIMER (ARMC ONLY): Fibrin derivatives D-dimer (ARMC): 475.52 ng/mL (FEU) (ref 0.00–499.00)

## 2019-11-14 LAB — BRAIN NATRIURETIC PEPTIDE: B Natriuretic Peptide: 20 pg/mL (ref 0.0–100.0)

## 2019-11-14 LAB — POC SARS CORONAVIRUS 2 AG: SARS Coronavirus 2 Ag: POSITIVE — AB

## 2019-11-14 LAB — LACTIC ACID, PLASMA: Lactic Acid, Venous: 0.9 mmol/L (ref 0.5–1.9)

## 2019-11-14 LAB — PROCALCITONIN: Procalcitonin: <0.1 ng/mL

## 2019-11-14 MED ORDER — DEXAMETHASONE SODIUM PHOSPHATE 10 MG/ML IJ SOLN
6.0000 mg | Freq: Once | INTRAMUSCULAR | Status: AC
  Administered 2019-11-14: 6 mg via INTRAVENOUS
  Filled 2019-11-14: qty 1

## 2019-11-14 MED ORDER — SODIUM CHLORIDE 0.9 % IV SOLN
200.0000 mg | Freq: Once | INTRAVENOUS | Status: AC
  Administered 2019-11-14: 200 mg via INTRAVENOUS
  Filled 2019-11-14: qty 40

## 2019-11-14 MED ORDER — SODIUM CHLORIDE 0.9 % IV SOLN
100.0000 mg | INTRAVENOUS | Status: AC
  Administered 2019-11-15 – 2019-11-18 (×4): 100 mg via INTRAVENOUS
  Filled 2019-11-14 (×4): qty 20

## 2019-11-14 NOTE — ED Notes (Signed)
Pt given water at this time 

## 2019-11-14 NOTE — ED Notes (Signed)
Pt states she had continuous fever since last Friday, pt had covid test at [facility] on Sunday and received positive result Tuesday.

## 2019-11-14 NOTE — H&P (Signed)
History and Physical  Patient Name: Emily Eaton     [id number]    DOB: April 07, 1976    DOA: 11/14/2019 PCP: Steele Sizer, MD  Patient coming from: Home  Chief Complaint: Fever, cough      HPI: Emily Eaton is a 43 y.o. F with hx no significant past medical history who presents with influenza-like illness for 1 week, now shortness of breath.  The patient was in her usual state of health until last week when she developed sinus congestion.  Then 6 days ago (Friday, November 20), patient developed fever, chills, malaise.  This persisted for the last week, with developing cough, diarrhea, and then the last day or so shortness of breath.  Finally today she felt worse so she came to the ER.  In the ER, temp 100 point 8F, heart rate 108, respirations 32, pulse ox 92% on room air.  Chest x-ray showed bilateral opacities.  Covid antigen positive.  Procalcitonin undetectable, lactate 0.9.  BNP normal.  Mild lymphopenia.  Given remdesivir and Decadron and hospitalist were asked to evaluate for COVID-19.          ROS: Review of Systems  Constitutional: Positive for chills, fever and malaise/fatigue.  HENT: Positive for congestion.   Respiratory: Positive for cough and shortness of breath. Negative for sputum production.   Cardiovascular: Negative for chest pain and leg swelling.  Gastrointestinal: Positive for diarrhea.  Musculoskeletal: Positive for myalgias.  Neurological: Positive for weakness. Negative for speech change, focal weakness, seizures and loss of consciousness.  All other systems reviewed and are negative.         Past Medical History:  Diagnosis Date  . Acute low back pain   . ADD (attention deficit disorder) without hyperactivity   . Anemia   . Chronic constipation   . Herpes simplex without complication   . History of ovarian cyst 04/2010  . Internal hemorrhoids   . Iron deficiency anemia due to chronic blood loss   . PMS (premenstrual syndrome)   .  Vitamin D deficiency     Past Surgical History:  Procedure Laterality Date  . ENDOMETRIAL BIOPSY    . NOVASURE ABLATION    . OVARIAN CYST REMOVAL     left  . TUBAL LIGATION    . WISDOM TOOTH EXTRACTION     x 1    Social History: Patient lives alone.  The patient walks unassisted.  She works in a pharmacy and also in daycare.  Nonsmoker.  Allergies  Allergen Reactions  . Codeine Itching and Rash    Family history: family history includes Asthma in her brother; Cancer in her maternal uncle; Dementia in her mother; Depression in her mother; Diabetes in her father and mother; Hyperlipidemia in her mother; Hypertension in her father and mother; Kidney disease in her mother; Liver disease in her father; Prostate cancer in her father; Stroke in her mother; Thrombocytopenia in her son.  Prior to Admission medications   Medication Sig Start Date End Date Taking? Authorizing Provider  albuterol (VENTOLIN HFA) 108 (90 Base) MCG/ACT inhaler Inhale 1 puff into the lungs every 4 (four) hours as needed for wheezing or shortness of breath. 11/10/19  Yes [provider]  azithromycin (ZITHROMAX Z-PAK) 250 MG tablet As directed Patient taking differently: Take 250-500 mg by mouth See admin instructions. Take 2 tablets (500mg ) by mouth on first day and then take 1 tablet (250mg ) by mouth daily for the next four days 11/12/19  Yes Hassell Done, Mary-Margaret,  FNP  benzonatate (TESSALON PERLES) 100 MG capsule Take 1 capsule (100 mg total) by mouth 3 (three) times daily as needed for cough. 11/12/19  Yes Martin, Mary-Margaret, FNP  Cholecalciferol (VITAMIN D) 50 MCG (2000 UT) CAPS Take 1 capsule (2,000 Units total) by mouth daily. 11/29/18  Yes Sowles, Drue Stager, MD  cyclobenzaprine (FLEXERIL) 5 MG tablet Take 1 tablet (5 mg total) by mouth at bedtime. Patient taking differently: Take 5 mg by mouth at bedtime as needed for muscle spasms.  07/24/18  Yes Sowles, Drue Stager, MD  fluticasone (FLONASE) 50 MCG/ACT  nasal spray Place 1-2 sprays into both nostrils daily as needed for allergies or rhinitis.    Yes [provider]  ibuprofen (ADVIL) 800 MG tablet Take 1 tablet (800 mg total) by mouth every 8 (eight) hours as needed. 11/04/19  Yes Sharion Balloon, NP       Physical Exam: BP 129/80   Pulse (!) 103   Temp 100.2 F (37.9 C) (Oral)   Resp (!) 25   Ht 5\' 5"  (1.651 m)   Wt 110.7 kg   LMP 11/14/2019 (Exact Date)   SpO2 97%   BMI 40.60 kg/m  General appearance: Well-developed, adult female, alert and in no acute distress.   Eyes: Anicteric, conjunctiva pink, lids and lashes normal. PERRL.    ENT: No nasal deformity, discharge, epistaxis.  Hearing normal. OP moist without lesions.   Skin: Warm and dry.  No jaundice.  No suspicious rashes or lesions. Cardiac: RRR, nl S1-S2, no murmurs appreciated.  Capillary refill is brisk.  JVP normal.  No LE edema.  Radial pulses 2+ and symmetric. Respiratory: Normal respiratory rate and rhythm.  CTAB without rales or wheezes. Abdomen: Abdomen soft.  No TTP or guarding. No ascites, distension, hepatosplenomegaly.   MSK: No deformities or effusions of the large joints of the upper or lower extremities bilaterally.  No cyanosis or clubbing.  Normal muscle bulk and tone Neuro: Cranial nerves normal.  Sensation intact to light touch. Speech is fluent.  Muscle strength slightly, generally weak.    Psych: Sensorium intact and responding to questions, attention normal.  Behavior appropriate.  Affect blunted.  Judgment and insight appear normal.     Labs on Admission:  I have personally reviewed following labs and imaging studies: CBC: Recent Labs  Lab 11/14/19 1940  WBC 3.3*  NEUTROABS 2.5  HGB 12.0  HCT 37.7  MCV 78.7*  PLT [id number]   Basic Metabolic Panel: Recent Labs  Lab 11/14/19 1940  NA 137  K 3.9  CL 104  CO2 23  GLUCOSE 108*  BUN 6  CREATININE 0.70  CALCIUM 8.8*   GFR: Estimated Creatinine Clearance: 110 mL/min (by C-G formula  based on SCr of 0.7 mg/dL).  Liver Function Tests: Recent Labs  Lab 11/14/19 1940  AST 17  ALT 11  ALKPHOS 50  BILITOT 0.5  PROT 7.8  ALBUMIN 3.4*    Sepsis Labs: Lactic acid normal.        Radiological Exams on Admission: Personally reviewed chest x-ray shows bilateral opacities: Dg Chest Portable 1 View  Result Date: 11/14/2019 CLINICAL DATA:  Positive COVID-19 test. Decreased oxygen saturation. EXAM: PORTABLE CHEST 1 VIEW COMPARISON:  06/08/2019 FINDINGS: There is shallow lung inflation with bibasilar opacities, left-greater-than-right, which are increased from the prior study. No pleural effusion or pneumothorax. IMPRESSION: Increased bibasilar opacities, left-greater-than-right, concerning for infection. Electronically Signed   By: Ulyses Jarred M.D.   On: 11/14/2019 19:34  Assessment/Plan    Coronavirus pneumonitis without acute hypoxic respiratory failure In setting of ongoing 2020 COVID-19 pandemic.  Patient has bilateral opacities, SPO2 less than 94%.  CRP pending.  This is moderate Covid, remdesivir and steroids have been started.  -Continue remdesivir, day 1 of 5 -Continue Decadron 6 mg IV daily, day 1 of 10 -VTE PPx with Lovenox -Continue Zinc and Vitamin C -Daily d-dimer, ferritin and CRP -Robitussin or Tussionex for cough -Flutter valve, turn cough and I-S, as well as proning as able -Repeat CRP tomorrow     Lymphopenia Due to Covid  Microcytosis without anemia -Check ferritin after resolution of Covid     DVT prophylaxis: Lovenox Code Status: Full code Family Communication:   Disposition Plan: Anticipate IV remdesivir, steroids, admission. Consults called:  Admission status: Inpatient     Medical decision making: Patient seen at 10:25 PM on 11/14/2019.  The patient was discussed with Dr. Ellender Hose.  What exists of the patient's chart was reviewed in depth and summarized above.  Clinical condition: Stable on supplemental  oxygen.        [facility] Please page though [place] or Epic secure chat:  For password, contact charge nurse

## 2019-11-14 NOTE — ED Notes (Signed)
Pt ambulated to restroom. 

## 2019-11-14 NOTE — ED Notes (Signed)
Attempted to call report at this time 

## 2019-11-14 NOTE — ED Provider Notes (Signed)
[facility] Emergency Department Provider Note  ____________________________________________   First MD Initiated Contact with Patient 11/14/19 1837     (approximate)  I have reviewed the triage vital signs and the nursing notes.   HISTORY  Chief Complaint Shortness of Breath    HPI Emily Eaton is a 43 y.o. female  Here with SOB, fever. Pt reports that her sx started as dry cough, sinus pressure 6-7 days ago. She then developed fevers up to 101-102 that have been persistent and daily. Went to [facility] and was tested positive for COVID. Over the last 3 days, she has now developed worsening and now severe SOB. She has had associated poor appetite. She has also developed a dry, worsening cough. Reports SOB at rest now, feels like she cannot catch her breath. No alleviating factors. Last tylenol was at around 11 AM.        Past Medical History:  Diagnosis Date  . Acute low back pain   . ADD (attention deficit disorder) without hyperactivity   . Anemia   . Chronic constipation   . Herpes simplex without complication   . History of ovarian cyst 04/2010  . Internal hemorrhoids   . Iron deficiency anemia due to chronic blood loss   . PMS (premenstrual syndrome)   . Vitamin D deficiency     Patient Active Problem List   Diagnosis Date Noted  . GERD (gastroesophageal reflux disease) 09/11/2019  . Other neutropenia ([place]) 05/03/2019  . Pre-diabetes 07/25/2018  . Dyslipidemia 07/25/2018  . Mild major depression ([place]) 07/24/2018  . Tension headache 07/24/2018  . Stiffness of right hand joint 07/28/2015  . ADD (attention deficit hyperactivity disorder, inattentive type) 07/25/2015  . Pain in toe 07/25/2015  . Chronic constipation 07/25/2015  . Herpes 07/25/2015  . Hemorrhoids, internal 07/25/2015  . Obesity (BMI 30-39.9) 07/25/2015  . Vitamin D deficiency 07/25/2015  . Radiculitis of right cervical region 04/14/2015  . Epicondylitis elbow, medial  10/02/2013  . Entrapment of ulnar nerve 10/02/2013  . Impingement syndrome of shoulder 10/02/2013  . Ovarian cyst 11/15/2011    Past Surgical History:  Procedure Laterality Date  . ENDOMETRIAL BIOPSY    . NOVASURE ABLATION    . OVARIAN CYST REMOVAL     left  . TUBAL LIGATION    . WISDOM TOOTH EXTRACTION     x 1    Prior to Admission medications   Medication Sig Start Date End Date Taking? Authorizing Provider  albuterol (VENTOLIN HFA) 108 (90 Base) MCG/ACT inhaler Inhale 1 puff into the lungs every 4 (four) hours as needed for wheezing or shortness of breath. 11/10/19  Yes [provider]  azithromycin (ZITHROMAX Z-PAK) 250 MG tablet As directed Patient taking differently: Take 250-500 mg by mouth See admin instructions. Take 2 tablets (500mg ) by mouth on first day and then take 1 tablet (250mg ) by mouth daily for the next four days 11/12/19  Yes Hassell Done, Mary-Margaret, FNP  benzonatate (TESSALON PERLES) 100 MG capsule Take 1 capsule (100 mg total) by mouth 3 (three) times daily as needed for cough. 11/12/19  Yes Martin, Mary-Margaret, FNP  Cholecalciferol (VITAMIN D) 50 MCG (2000 UT) CAPS Take 1 capsule (2,000 Units total) by mouth daily. 11/29/18  Yes Sowles, Drue Stager, MD  cyclobenzaprine (FLEXERIL) 5 MG tablet Take 1 tablet (5 mg total) by mouth at bedtime. Patient taking differently: Take 5 mg by mouth at bedtime as needed for muscle spasms.  07/24/18  Yes Steele Sizer, MD  fluticasone (FLONASE) 50 MCG/ACT nasal spray Place 1-2 sprays into both nostrils daily as needed for allergies or rhinitis.    Yes [provider]  ibuprofen (ADVIL) 800 MG tablet Take 1 tablet (800 mg total) by mouth every 8 (eight) hours as needed. 11/04/19  Yes Sharion Balloon, NP  predniSONE (DELTASONE) 20 MG tablet 2 po at sametime daily for 5 days Patient taking differently: Take 40 mg by mouth daily. Take 2 tablets (40mg ) by mouth at the same time daily for five days 11/12/19  Yes Hassell Done,  Mary-Margaret, FNP  famotidine (PEPCID) 20 MG tablet Take 1 tablet (20 mg total) by mouth 2 (two) times daily. Patient not taking: Reported on 11/14/2019 06/08/19 06/07/20  Schuyler Amor, MD  hydrOXYzine (ATARAX/VISTARIL) 10 MG tablet Take 1-2 tablets (10-20 mg total) by mouth at bedtime. Patient not taking: Reported on 11/14/2019 05/03/19   Steele Sizer, MD  polyethylene glycol powder (GLYCOLAX/MIRALAX) powder TAKE 17 GRAMS BY MOUTH 2 TIMES DAILY AS NEEDED. Patient not taking: Reported on 11/14/2019 10/26/17   Steele Sizer, MD    Allergies Codeine  Family History  Problem Relation Age of Onset  . Diabetes Mother   . Depression Mother   . Hypertension Mother   . Kidney disease Mother   . Hyperlipidemia Mother   . Dementia Mother   . Stroke Mother        March 18, 2018 (currently in rehab)   . Hypertension Father   . Diabetes Father   . Liver disease Father   . Prostate cancer Father   . Asthma Brother   . Cancer Maternal Uncle        prostate  . Thrombocytopenia Son        ITP    Social History Social History   Tobacco Use  . Smoking status: Never Smoker  . Smokeless tobacco: Never Used  Substance Use Topics  . Alcohol use: Yes    Alcohol/week: 2.0 standard drinks    Types: 2 Standard drinks or equivalent per week    Comment: occassionally  . Drug use: No    Review of Systems  Review of Systems  Constitutional: Positive for chills, fatigue and fever.  HENT: Positive for congestion. Negative for sore throat.   Eyes: Negative for visual disturbance.  Respiratory: Positive for cough and shortness of breath.   Cardiovascular: Negative for chest pain.  Gastrointestinal: Negative for abdominal pain, diarrhea, nausea and vomiting.  Genitourinary: Negative for flank pain.  Musculoskeletal: Negative for back pain and neck pain.  Skin: Negative for rash and wound.  Neurological: Positive for weakness.  All other systems reviewed and are negative.     ____________________________________________  PHYSICAL EXAM:      VITAL SIGNS: ED Triage Vitals  Enc Vitals Group     BP --      Pulse Rate 11/14/19 1833 (!) 108     Resp 11/14/19 1833 (!) 23     Temp 11/14/19 1833 100.2 F (37.9 C)     Temp Source 11/14/19 1833 Oral     SpO2 11/14/19 1833 92 %     Weight 11/14/19 1836 244 lb (110.7 kg)     Height 11/14/19 1836 5\' 5"  (1.651 m)     Head Circumference --      Peak Flow --      Pain Score 11/14/19 1836 8     Pain Loc --      Pain Edu? --      Excl. in  GC? --      Physical Exam Vitals signs and nursing note reviewed.  Constitutional:      General: She is not in acute distress.    Appearance: She is well-developed.  HENT:     Head: Normocephalic and atraumatic.  Eyes:     Conjunctiva/sclera: Conjunctivae normal.  Neck:     Musculoskeletal: Neck supple.  Cardiovascular:     Rate and Rhythm: Regular rhythm. Tachycardia present.     Heart sounds: Normal heart sounds. No murmur. No friction rub.  Pulmonary:     Effort: Pulmonary effort is normal. Tachypnea present. No respiratory distress.     Breath sounds: Examination of the right-upper field reveals rales. Examination of the left-upper field reveals rales. Examination of the right-middle field reveals rales. Examination of the left-middle field reveals rales. Examination of the right-lower field reveals rales. Examination of the left-lower field reveals rales. Rales present. No wheezing.  Abdominal:     General: There is no distension.     Palpations: Abdomen is soft.     Tenderness: There is no abdominal tenderness.  Skin:    General: Skin is warm.     Capillary Refill: Capillary refill takes less than 2 seconds.  Neurological:     Mental Status: She is alert and oriented to person, place, and time.     Motor: No abnormal muscle tone.       ____________________________________________   LABS (all labs ordered are listed, but only abnormal results are displayed)   Labs Reviewed - No data to display  ____________________________________________  EKG: Sinus tachycardia, VR 106. QRS 89, QTc 435. No acute ST elevations. TWI in lead III. ________________________________________  RADIOLOGY All imaging, including plain films, CT scans, and ultrasounds, independently reviewed by me, and interpretations confirmed via formal radiology reads.  ED MD interpretation:   CXR: Multifocal, primarily basilar PNA  Official radiology report(s): No results found.  ____________________________________________  PROCEDURES   Procedure(s) performed (including Critical Care):  .Critical Care Performed by: Duffy Bruce, MD Authorized by: Duffy Bruce, MD   Critical care provider statement:    Critical care time (minutes):  35   Critical care time was exclusive of:  Separately billable procedures and treating other patients and teaching time   Critical care was necessary to treat or prevent imminent or life-threatening deterioration of the following conditions:  Cardiac failure, circulatory failure and respiratory failure   Critical care was time spent personally by me on the following activities:  Development of treatment plan with patient or surrogate, discussions with consultants, evaluation of patient's response to treatment, examination of patient, obtaining history from patient or surrogate, ordering and performing treatments and interventions, ordering and review of laboratory studies, ordering and review of radiographic studies, pulse oximetry, re-evaluation of patient's condition and review of old charts   I assumed direction of critical care for this patient from another provider in my specialty: no      ____________________________________________  INITIAL IMPRESSION / MDM / [place] / ED COURSE  As part of my medical decision making, I reviewed the following data within the [place] notes reviewed and  incorporated, Old chart reviewed, Notes from prior ED visits, and  Controlled Substance Database       *ELLAH DEBLANC was evaluated in Emergency Department on 11/14/2019 for the symptoms described in the history of present illness. She was evaluated in the context of the global COVID-19 pandemic, which necessitated consideration that the patient might  be at risk for infection with the SARS-CoV-2 virus that causes COVID-19. Institutional protocols and algorithms that pertain to the evaluation of patients at risk for COVID-19 are in a state of rapid change based on information released by regulatory bodies including the CDC and federal and state organizations. These policies and algorithms were followed during the patient's care in the ED.  Some ED evaluations and interventions may be delayed as a result of limited staffing during the pandemic.*     Medical Decision Making:  43 yo F here with mild hypoxic rsp failure 2/2 COVID-19. Improved on 2L [place]. WOB mildly increased but speaking in full sentences. Procal neg, no signs of significant superimposed bacterial infection. [facility] to [facility].  ____________________________________________  FINAL CLINICAL IMPRESSION(S) / ED DIAGNOSES  Final diagnoses:  None     MEDICATIONS GIVEN DURING THIS VISIT:  Medications - No data to display   ED Discharge Orders    None       Note:  This document was prepared using Dragon voice recognition software and may include unintentional dictation errors.   Duffy Bruce, MD 11/15/19 [phone]

## 2019-11-14 NOTE — Consult Note (Signed)
Remdesivir - Pharmacy Brief Note   O:  ALT: 11 CXR: Evidence of lower respiratory infection on chest imaging SpO2: Pt had sat of 90% on room air EMS placed pt on 3L O2 [place] sats came up to mid 90's   A/P:  Remdesivir 200 mg IVPB once followed by 100 mg IVPB daily x 4 days.   Lu Duffel, PharmD, BCPS Clinical Pharmacist 11/14/2019 7:51 PM

## 2019-11-14 NOTE — ED Triage Notes (Signed)
Pt arrived from home via [facility] c/o SOB. Pt had a Covid positive test result come back on prior Tuesday. Pt had sat of 90% on room air EMS placed pt on 3L O2 [place] sats came up to mid 90's.

## 2019-11-14 NOTE — ED Notes (Signed)
Pts sats dropped to 88% while ambulating on room air. Pt returned to bed, placed on 2L [place] at this time

## 2019-11-15 ENCOUNTER — Other Ambulatory Visit: Payer: Self-pay

## 2019-11-15 DIAGNOSIS — J1289 Other viral pneumonia: Secondary | ICD-10-CM

## 2019-11-15 DIAGNOSIS — U071 COVID-19: Principal | ICD-10-CM

## 2019-11-15 LAB — COMPREHENSIVE METABOLIC PANEL
ALT: 10 U/L (ref 0–44)
AST: 20 U/L (ref 15–41)
Albumin: 3.3 g/dL — ABNORMAL LOW (ref 3.5–5.0)
Alkaline Phosphatase: 47 U/L (ref 38–126)
Anion gap: 11 (ref 5–15)
BUN: 10 mg/dL (ref 6–20)
CO2: 23 mmol/L (ref 22–32)
Calcium: 8.8 mg/dL — ABNORMAL LOW (ref 8.9–10.3)
Chloride: 103 mmol/L (ref 98–111)
Creatinine, Ser: 0.51 mg/dL (ref 0.44–1.00)
GFR calc Af Amer: >60 mL/min (ref >60)
GFR calc non Af Amer: >60 mL/min (ref >60)
Glucose, Bld: 177 mg/dL — ABNORMAL HIGH (ref 70–99)
Potassium: 3.7 mmol/L (ref 3.5–5.1)
Sodium: 137 mmol/L (ref 135–145)
Total Bilirubin: 0.5 mg/dL (ref 0.3–1.2)
Total Protein: 7.9 g/dL (ref 6.5–8.1)

## 2019-11-15 LAB — CBC WITH DIFFERENTIAL/PLATELET
Abs Immature Granulocytes: 0.01 10*3/uL (ref 0.00–0.07)
Basophils Absolute: 0 10*3/uL (ref 0.0–0.1)
Basophils Relative: 0 %
Eosinophils Absolute: 0 10*3/uL (ref 0.0–0.5)
Eosinophils Relative: 0 %
HCT: 36.9 % (ref 36.0–46.0)
Hemoglobin: 11.6 g/dL — ABNORMAL LOW (ref 12.0–15.0)
Immature Granulocytes: 0 %
Lymphocytes Relative: 19 %
Lymphs Abs: 0.8 10*3/uL (ref 0.7–4.0)
MCH: 24.8 pg — ABNORMAL LOW (ref 26.0–34.0)
MCHC: 31.4 g/dL (ref 30.0–36.0)
MCV: 78.8 fL — ABNORMAL LOW (ref 80.0–100.0)
Monocytes Absolute: 0.2 10*3/uL (ref 0.1–1.0)
Monocytes Relative: 5 %
Neutro Abs: 3.1 10*3/uL (ref 1.7–7.7)
Neutrophils Relative %: 76 %
Platelets: 223 10*3/uL (ref 150–400)
RBC: 4.68 MIL/uL (ref 3.87–5.11)
RDW: 13.1 % (ref 11.5–15.5)
WBC: 4.1 10*3/uL (ref 4.0–10.5)
nRBC: 0 % (ref 0.0–0.2)

## 2019-11-15 LAB — C-REACTIVE PROTEIN
CRP: 4.5 mg/dL — ABNORMAL HIGH (ref <1.0)
CRP: 4.4 mg/dL — ABNORMAL HIGH (ref <1.0)

## 2019-11-15 MED ORDER — GUAIFENESIN-DM 100-10 MG/5ML PO SYRP
10.0000 mL | ORAL_SOLUTION | ORAL | Status: DC | PRN
  Administered 2019-11-16: 10 mL via ORAL
  Filled 2019-11-15: qty 10

## 2019-11-15 MED ORDER — VITAMIN C 500 MG PO TABS
500.0000 mg | ORAL_TABLET | Freq: Every day | ORAL | Status: DC
  Administered 2019-11-15 – 2019-11-18 (×4): 500 mg via ORAL
  Filled 2019-11-15 (×4): qty 1

## 2019-11-15 MED ORDER — ZINC SULFATE 220 (50 ZN) MG PO CAPS
220.0000 mg | ORAL_CAPSULE | Freq: Every day | ORAL | Status: DC
  Administered 2019-11-15 – 2019-11-18 (×4): 220 mg via ORAL
  Filled 2019-11-15 (×4): qty 1

## 2019-11-15 MED ORDER — ONDANSETRON HCL 4 MG/2ML IJ SOLN: 4.0000 mg | Freq: Four times a day (QID) | INTRAMUSCULAR | Status: DC | PRN

## 2019-11-15 MED ORDER — ACETAMINOPHEN 325 MG PO TABS
650.0000 mg | ORAL_TABLET | Freq: Four times a day (QID) | ORAL | Status: DC | PRN
  Administered 2019-11-15 – 2019-11-17 (×5): 650 mg via ORAL
  Filled 2019-11-15 (×5): qty 2

## 2019-11-15 MED ORDER — ENOXAPARIN SODIUM 40 MG/0.4ML ~~LOC~~ SOLN
40.0000 mg | Freq: Two times a day (BID) | SUBCUTANEOUS | Status: DC
  Administered 2019-11-15 (×2): 40 mg via SUBCUTANEOUS
  Filled 2019-11-15: qty 0.4

## 2019-11-15 MED ORDER — HYDROCOD POLST-CPM POLST ER 10-8 MG/5ML PO SUER
5.0000 mL | Freq: Two times a day (BID) | ORAL | Status: DC | PRN
  Administered 2019-11-15: 5 mL via ORAL
  Filled 2019-11-15: qty 5

## 2019-11-15 MED ORDER — BOOST / RESOURCE BREEZE PO LIQD CUSTOM
1.0000 | Freq: Three times a day (TID) | ORAL | Status: DC
  Administered 2019-11-15 – 2019-11-17 (×5): 1 via ORAL

## 2019-11-15 MED ORDER — ADULT MULTIVITAMIN W/MINERALS CH
1.0000 | ORAL_TABLET | Freq: Every day | ORAL | Status: DC
  Administered 2019-11-16 – 2019-11-18 (×3): 1 via ORAL
  Filled 2019-11-15 (×3): qty 1

## 2019-11-15 MED ORDER — ONDANSETRON HCL 4 MG PO TABS: 4.0000 mg | ORAL_TABLET | Freq: Four times a day (QID) | ORAL | Status: DC | PRN

## 2019-11-15 MED ORDER — DEXAMETHASONE SODIUM PHOSPHATE 10 MG/ML IJ SOLN
6.0000 mg | Freq: Every day | INTRAMUSCULAR | Status: DC
  Administered 2019-11-15 – 2019-11-18 (×4): 6 mg via INTRAVENOUS
  Filled 2019-11-15 (×4): qty 0.6

## 2019-11-15 NOTE — Progress Notes (Addendum)
[place] [facility] [place] NAME: Emily Eaton    MR#:  [id number]  DATE OF BIRTH:  08-31-1976  SUBJECTIVE:  CHIEF COMPLAINT:   Chief Complaint  Patient presents with  . Shortness of Breath  Shortness of breath, cough REVIEW OF SYSTEMS:  Review of Systems  Constitutional: Positive for malaise/fatigue. Negative for diaphoresis, fever and weight loss.  HENT: Negative for ear discharge, ear pain, hearing loss, nosebleeds, sore throat and tinnitus.   Eyes: Negative for blurred vision and pain.  Respiratory: Positive for cough and shortness of breath. Negative for hemoptysis and wheezing.   Cardiovascular: Negative for chest pain, palpitations, orthopnea and leg swelling.  Gastrointestinal: Negative for abdominal pain, blood in stool, constipation, diarrhea, heartburn, nausea and vomiting.  Genitourinary: Negative for dysuria, frequency and urgency.  Musculoskeletal: Negative for back pain and myalgias.  Skin: Negative for itching and rash.  Neurological: Negative for dizziness, tingling, tremors, focal weakness, seizures, weakness and headaches.  Psychiatric/Behavioral: Negative for depression. The patient is not nervous/anxious.     DRUG ALLERGIES:   Allergies  Allergen Reactions  . Codeine Itching and Rash   VITALS:  Blood pressure 125/90, pulse 75, temperature 97.9 F (36.6 C), temperature source Oral, resp. rate 18, height 5\' 5"  (1.651 m), weight 110.7 kg, last menstrual period 11/14/2019, SpO2 96 %. PHYSICAL EXAMINATION:  Physical Exam HENT:     Head: Normocephalic and atraumatic.  Eyes:     Conjunctiva/sclera: Conjunctivae normal.     Pupils: Pupils are equal, round, and reactive to light.  Neck:     Musculoskeletal: Normal range of motion and neck supple.     Thyroid: No thyromegaly.     Trachea: No tracheal deviation.  Cardiovascular:     Rate and Rhythm: Normal rate and regular rhythm.     Heart sounds: Normal heart sounds.   Pulmonary:     Effort: Pulmonary effort is normal. No respiratory distress.     Breath sounds: Normal breath sounds. No wheezing.  Chest:     Chest wall: No tenderness.  Abdominal:     General: Bowel sounds are normal. There is no distension.     Palpations: Abdomen is soft.     Tenderness: There is no abdominal tenderness.  Musculoskeletal: Normal range of motion.  Skin:    General: Skin is warm and dry.     Findings: No rash.  Neurological:     Mental Status: She is alert and oriented to person, place, and time.     Cranial Nerves: No cranial nerve deficit.    LABORATORY PANEL:  Female CBC Recent Labs  Lab 11/15/19 0423  WBC 4.1  HGB 11.6*  HCT 36.9  PLT 223   ------------------------------------------------------------------------------------------------------------------ Chemistries  Recent Labs  Lab 11/15/19 0423  NA 137  K 3.7  CL 103  CO2 23  GLUCOSE 177*  BUN 10  CREATININE 0.51  CALCIUM 8.8*  AST 20  ALT 10  ALKPHOS 47  BILITOT 0.5   RADIOLOGY:  Dg Chest Portable 1 View  Result Date: 11/14/2019 CLINICAL DATA:  Positive COVID-19 test. Decreased oxygen saturation. EXAM: PORTABLE CHEST 1 VIEW COMPARISON:  06/08/2019 FINDINGS: There is shallow lung inflation with bibasilar opacities, left-greater-than-right, which are increased from the prior study. No pleural effusion or pneumothorax. IMPRESSION: Increased bibasilar opacities, left-greater-than-right, concerning for infection. Electronically Signed   By: Ulyses Jarred M.D.   On: 11/14/2019 19:34   ASSESSMENT AND PLAN:  Coronavirus pneumonitis without acute hypoxic respiratory failure In setting of ongoing 2020 COVID-19 pandemic. - Patient has bilateral opacities on chest x-ray, SPO2 less than 94% on admission on room air.   -Start remdesivir day 2/5  -Dexamethasone 6 mg IV daily -2/10 days -VTE PPx with Lovenox -Continue Zinc and Vitamin C -Daily d-dimer, ferritin and CRP -Robitussin or Tussionex  for cough -Flutter valve, turn cough and I-S, as well as proning as able  Lymphopenia Due to Covid  Microcytosis without anemia -Check ferritin after resolution of Covid     DVT prophylaxis: Lovenox Code Status: Full code Family Communication:   None at bedside Disposition Plan:  Home Consults called:  None Admission status: Inpatient      All the records are reviewed and case discussed with Care Management/Social Worker. Management plans discussed with the patient, nursing and they are in agreement.  CODE STATUS: Full Code  TOTAL TIME TAKING CARE OF THIS PATIENT: 35 minutes.   More than 50% of the time was spent in counseling/coordination of care: YES  POSSIBLE D/C IN 4 DAYS, DEPENDING ON CLINICAL CONDITION.   Max Sane M.D on 11/15/2019 at 1:35 PM  Between 7am to 6pm - Pager - [phone]  After 6pm go to [email or web address] - password [email or web address]  [facility]   CC: Primary care physician; Steele Sizer, MD  Note: This dictation was prepared with Dragon dictation along with smaller phrase technology. Any transcriptional errors that result from this process are unintentional.

## 2019-11-15 NOTE — Progress Notes (Signed)
Patient denies any needs right now. Will continue to monitor patient.

## 2019-11-15 NOTE — Progress Notes (Signed)
PHARMACIST - PHYSICIAN COMMUNICATION  CONCERNING:  Enoxaparin (Lovenox) for DVT Prophylaxis    RECOMMENDATION: Patient was prescribed enoxaprin 40mg  q24 hours for VTE prophylaxis.   Filed Weights   11/14/19 1836  Weight: 244 lb (110.7 kg)    Body mass index is 40.6 kg/m.  Estimated Creatinine Clearance: 110 mL/min (by C-G formula based on SCr of 0.7 mg/dL).  Based on [place] patient is candidate for enoxaparin 40mg  every 12 hour dosing due to BMI being >40.  DESCRIPTION: Pharmacy has adjusted enoxaparin dose per [facility] policy.  Patient is now receiving enoxaparin 40mg  every 12 hours.   Ena Dawley, PharmD Clinical Pharmacist  11/15/2019 1:32 AM

## 2019-11-15 NOTE — Progress Notes (Signed)
Initial Nutrition Assessment  DOCUMENTATION CODES:   Obesity unspecified  INTERVENTION:   Boost Breeze po TID, each supplement provides 250 kcal and 9 grams of protein  MVI daily   NUTRITION DIAGNOSIS:   Increased nutrient needs related to acute illness(COVID 19 infection) as evidenced by increased estimated needs.  GOAL:   Patient will meet greater than or equal to 90% of their needs  MONITOR:   PO intake, Supplement acceptance, Labs, Weight trends, Skin, I & O's  REASON FOR ASSESSMENT:   Malnutrition Screening Tool    ASSESSMENT:   43 y/o famel admitted with coronavirus pneumonitis without acute hypoxic respiratory failure   Spoke with pt via phone. Pt reports poor appetite and oral intake for 4-5 days pta and currently. Pt reports that every time she eats, her stomach gets upset. Pt is willing to try Boost Breeze, pt does not feel that she can tolerate Ensure. RD will add supplements and MVI to help pt meet her estimated needs. Per chart, pt appears fairly weight stable at baseline. Pt's admit weight is ~20lb up from her UBW; RD suspects this weight may be inaccurate.   Medications reviewed and include: dexamethasone, lovenox, vitamin C, zinc  Labs reviewed:   Unable to complete Nutrition-Focused physical exam at this time as pt with COVID 19.   Diet Order:   Diet Order            Diet regular Room service appropriate? Yes; Fluid consistency: Thin  Diet effective now             EDUCATION NEEDS:   Education needs have been addressed  Skin:  Skin Assessment: Reviewed RN Assessment  Last BM:  11/26  Height:   Ht Readings from Last 1 Encounters:  11/14/19 5\' 5"  (1.651 m)    Weight:   Wt Readings from Last 1 Encounters:  11/14/19 110.7 kg    Ideal Body Weight:  56.8 kg  BMI:  Body mass index is 40.6 kg/m.  Estimated Nutritional Needs:   Kcal:  2100-2400kcal/day  Protein:  105-120g/day  Fluid:  >1.7L/day  Koleen Distance MS, RD,  LDN Pager #- [phone] Office#- [phone] After Hours Pager: [phone]

## 2019-11-15 NOTE — Plan of Care (Signed)
  Problem: Education: Goal: Knowledge of [facility] Education information/materials will improve Outcome: Progressing Goal: Emotional status will improve Outcome: Progressing Goal: Mental status will improve Outcome: Progressing Goal: Verbalization of understanding the information provided will improve Outcome: Progressing   Problem: Activity: Goal: Interest or engagement in activities will improve Outcome: Progressing Goal: Sleeping patterns will improve Outcome: Progressing   Problem: Coping: Goal: Ability to verbalize frustrations and anger appropriately will improve Outcome: Progressing Goal: Ability to demonstrate self-control will improve Outcome: Progressing   Problem: Health Behavior/Discharge Planning: Goal: Identification of resources available to assist in meeting health care needs will improve Outcome: Progressing Goal: Compliance with treatment plan for underlying cause of condition will improve Outcome: Progressing   Problem: Physical Regulation: Goal: Ability to maintain clinical measurements within normal limits will improve Outcome: Progressing   Problem: Safety: Goal: Periods of time without injury will increase Outcome: Progressing   

## 2019-11-15 NOTE — Plan of Care (Signed)
  Problem: Education: Goal: Knowledge of [place] [facility] Education information/materials will improve Outcome: Progressing Goal: Mental status will improve Outcome: Progressing Goal: Verbalization of understanding the information provided will improve Outcome: Progressing   Problem: Physical Regulation: Goal: Ability to maintain clinical measurements within normal limits will improve Outcome: Progressing   Problem: Education: Goal: Knowledge of risk factors and measures for prevention of condition will improve Outcome: Progressing

## 2019-11-15 NOTE — ED Notes (Signed)
Attempted to call report at this time 

## 2019-11-16 LAB — CBC WITH DIFFERENTIAL/PLATELET
Abs Immature Granulocytes: 0.01 10*3/uL (ref 0.00–0.07)
Basophils Absolute: 0 10*3/uL (ref 0.0–0.1)
Basophils Relative: 0 %
Eosinophils Absolute: 0 10*3/uL (ref 0.0–0.5)
Eosinophils Relative: 0 %
HCT: 35.3 % — ABNORMAL LOW (ref 36.0–46.0)
Hemoglobin: 11.5 g/dL — ABNORMAL LOW (ref 12.0–15.0)
Immature Granulocytes: 0 %
Lymphocytes Relative: 45 %
Lymphs Abs: 1.6 10*3/uL (ref 0.7–4.0)
MCH: 24.8 pg — ABNORMAL LOW (ref 26.0–34.0)
MCHC: 32.6 g/dL (ref 30.0–36.0)
MCV: 76.2 fL — ABNORMAL LOW (ref 80.0–100.0)
Monocytes Absolute: 0.3 10*3/uL (ref 0.1–1.0)
Monocytes Relative: 9 %
Neutro Abs: 1.7 10*3/uL (ref 1.7–7.7)
Neutrophils Relative %: 46 %
Platelets: 244 10*3/uL (ref 150–400)
RBC: 4.63 MIL/uL (ref 3.87–5.11)
RDW: 13 % (ref 11.5–15.5)
WBC: 3.6 10*3/uL — ABNORMAL LOW (ref 4.0–10.5)
nRBC: 0 % (ref 0.0–0.2)

## 2019-11-16 LAB — COMPREHENSIVE METABOLIC PANEL
ALT: 10 U/L (ref 0–44)
AST: 14 U/L — ABNORMAL LOW (ref 15–41)
Albumin: 3.1 g/dL — ABNORMAL LOW (ref 3.5–5.0)
Alkaline Phosphatase: 40 U/L (ref 38–126)
Anion gap: 9 (ref 5–15)
BUN: 13 mg/dL (ref 6–20)
CO2: 23 mmol/L (ref 22–32)
Calcium: 8.6 mg/dL — ABNORMAL LOW (ref 8.9–10.3)
Chloride: 106 mmol/L (ref 98–111)
Creatinine, Ser: 0.51 mg/dL (ref 0.44–1.00)
GFR calc Af Amer: >60 mL/min (ref >60)
GFR calc non Af Amer: >60 mL/min (ref >60)
Glucose, Bld: 100 mg/dL — ABNORMAL HIGH (ref 70–99)
Potassium: 4 mmol/L (ref 3.5–5.1)
Sodium: 138 mmol/L (ref 135–145)
Total Bilirubin: 0.4 mg/dL (ref 0.3–1.2)
Total Protein: 7.4 g/dL (ref 6.5–8.1)

## 2019-11-16 MED ORDER — PHENOL 1.4 % MT LIQD
1.0000 | OROMUCOSAL | Status: DC | PRN
  Administered 2019-11-16: 1 via OROMUCOSAL
  Filled 2019-11-16: qty 177

## 2019-11-16 MED ORDER — ENOXAPARIN SODIUM 40 MG/0.4ML ~~LOC~~ SOLN
40.0000 mg | SUBCUTANEOUS | Status: DC
  Administered 2019-11-16 – 2019-11-17 (×2): 40 mg via SUBCUTANEOUS
  Filled 2019-11-16 (×2): qty 0.4

## 2019-11-16 NOTE — Plan of Care (Signed)
  Problem: Education: Goal: Knowledge of [place] [facility] Education information/materials will improve Outcome: Progressing   Problem: Physical Regulation: Goal: Ability to maintain clinical measurements within normal limits will improve Outcome: Progressing   Problem: Education: Goal: Knowledge of risk factors and measures for prevention of condition will improve Outcome: Progressing

## 2019-11-16 NOTE — Progress Notes (Signed)
Checked on pt throughout the evening. In to see pt, the pt during the times were sleeping. Continued to monitor throughout the evening.

## 2019-11-16 NOTE — Progress Notes (Signed)
[facility] [facility] [place] NAME: Emily Eaton    MR#:  [id number]  DATE OF BIRTH:  August 14, 1976  SUBJECTIVE:  CHIEF COMPLAINT:   Chief Complaint  Patient presents with  . Shortness of Breath  Shortness of breath, cough improving, wanted to see if repeat chest x-ray would benefit ensure clearance of Covid. REVIEW OF SYSTEMS:  Review of Systems  Constitutional: Positive for malaise/fatigue. Negative for diaphoresis, fever and weight loss.  HENT: Negative for ear discharge, ear pain, hearing loss, nosebleeds, sore throat and tinnitus.   Eyes: Negative for blurred vision and pain.  Respiratory: Positive for cough and shortness of breath. Negative for hemoptysis and wheezing.   Cardiovascular: Negative for chest pain, palpitations, orthopnea and leg swelling.  Gastrointestinal: Negative for abdominal pain, blood in stool, constipation, diarrhea, heartburn, nausea and vomiting.  Genitourinary: Negative for dysuria, frequency and urgency.  Musculoskeletal: Negative for back pain and myalgias.  Skin: Negative for itching and rash.  Neurological: Negative for dizziness, tingling, tremors, focal weakness, seizures, weakness and headaches.  Psychiatric/Behavioral: Negative for depression. The patient is not nervous/anxious.     DRUG ALLERGIES:   Allergies  Allergen Reactions  . Codeine Itching and Rash   VITALS:  Blood pressure 135/88, pulse 67, temperature 97.8 F (36.6 C), temperature source Oral, resp. rate 18, height 5\' 5"  (1.651 m), weight 100.3 kg, last menstrual period 11/14/2019, SpO2 96 %. PHYSICAL EXAMINATION:  Physical Exam HENT:     Head: Normocephalic and atraumatic.  Eyes:     Conjunctiva/sclera: Conjunctivae normal.     Pupils: Pupils are equal, round, and reactive to light.  Neck:     Musculoskeletal: Normal range of motion and neck supple.     Thyroid: No thyromegaly.     Trachea: No tracheal deviation.  Cardiovascular:     Rate and  Rhythm: Normal rate and regular rhythm.     Heart sounds: Normal heart sounds.  Pulmonary:     Effort: Pulmonary effort is normal. No respiratory distress.     Breath sounds: Normal breath sounds. No wheezing.  Chest:     Chest wall: No tenderness.  Abdominal:     General: Bowel sounds are normal. There is no distension.     Palpations: Abdomen is soft.     Tenderness: There is no abdominal tenderness.  Musculoskeletal: Normal range of motion.  Skin:    General: Skin is warm and dry.     Findings: No rash.  Neurological:     Mental Status: She is alert and oriented to person, place, and time.     Cranial Nerves: No cranial nerve deficit.    LABORATORY PANEL:  Female CBC Recent Labs  Lab 11/16/19 0430  WBC 3.6*  HGB 11.5*  HCT 35.3*  PLT 244   ------------------------------------------------------------------------------------------------------------------ Chemistries  Recent Labs  Lab 11/16/19 0430  NA 138  K 4.0  CL 106  CO2 23  GLUCOSE 100*  BUN 13  CREATININE 0.51  CALCIUM 8.6*  AST 14*  ALT 10  ALKPHOS 40  BILITOT 0.4   RADIOLOGY:  No results found. ASSESSMENT AND PLAN:   Coronavirus pneumonitis without acute hypoxic respiratory failure In setting of ongoing 2020 COVID-19 pandemic. - Patient has bilateral opacities on chest x-ray, SPO2 less than 94% on admission on room air.   -Start remdesivir day 3/5  -Dexamethasone 6 mg IV daily - 3/10 days -VTE PPx with Lovenox -Continue Zinc and Vitamin C -  Daily d-dimer, ferritin and CRP -Robitussin or Tussionex for cough -Flutter valve, turn cough and I-S, as well as proning as able  Lymphopenia Due to Covid  Microcytosis without anemia -Check ferritin after resolution of Covid     DVT prophylaxis: Lovenox   All the records are reviewed and case discussed with Care Management/Social Worker. Management plans discussed with the patient, nursing and they are in agreement.  CODE STATUS: Full Code   TOTAL TIME TAKING CARE OF THIS PATIENT: 35 minutes.   More than 50% of the time was spent in counseling/coordination of care: YES  POSSIBLE D/C IN 2-3 DAYS, DEPENDING ON CLINICAL CONDITION.   Max Sane M.D on 11/16/2019 at 1:20 PM  Between 7am to 6pm - Pager - [phone]  After 6pm go to [email or web address] - password [email or web address]  [facility]   CC: Primary care physician; Steele Sizer, MD  Note: This dictation was prepared with Dragon dictation along with smaller phrase technology. Any transcriptional errors that result from this process are unintentional.

## 2019-11-16 NOTE — Plan of Care (Signed)
  Problem: Education: Goal: Emotional status will improve Outcome: Progressing   Problem: Coping: Goal: Ability to verbalize frustrations and anger appropriately will improve Outcome: Progressing   Problem: Health Behavior/Discharge Planning: Goal: Compliance with treatment plan for underlying cause of condition will improve Outcome: Progressing

## 2019-11-17 ENCOUNTER — Inpatient Hospital Stay: Payer: BC Managed Care – PPO

## 2019-11-17 LAB — CBC WITH DIFFERENTIAL/PLATELET
Abs Immature Granulocytes: 0 10*3/uL (ref 0.00–0.07)
Basophils Absolute: 0 10*3/uL (ref 0.0–0.1)
Basophils Relative: 0 %
Eosinophils Absolute: 0 10*3/uL (ref 0.0–0.5)
Eosinophils Relative: 0 %
HCT: 36.1 % (ref 36.0–46.0)
Hemoglobin: 11.6 g/dL — ABNORMAL LOW (ref 12.0–15.0)
Immature Granulocytes: 0 %
Lymphocytes Relative: 55 %
Lymphs Abs: 2.6 10*3/uL (ref 0.7–4.0)
MCH: 25.4 pg — ABNORMAL LOW (ref 26.0–34.0)
MCHC: 32.1 g/dL (ref 30.0–36.0)
MCV: 79.2 fL — ABNORMAL LOW (ref 80.0–100.0)
Monocytes Absolute: 0.4 10*3/uL (ref 0.1–1.0)
Monocytes Relative: 8 %
Neutro Abs: 1.7 10*3/uL (ref 1.7–7.7)
Neutrophils Relative %: 37 %
Platelets: 265 10*3/uL (ref 150–400)
RBC: 4.56 MIL/uL (ref 3.87–5.11)
RDW: 12.9 % (ref 11.5–15.5)
WBC: 4.6 10*3/uL (ref 4.0–10.5)
nRBC: 0 % (ref 0.0–0.2)

## 2019-11-17 LAB — COMPREHENSIVE METABOLIC PANEL
ALT: 12 U/L (ref 0–44)
AST: 17 U/L (ref 15–41)
Albumin: 3 g/dL — ABNORMAL LOW (ref 3.5–5.0)
Alkaline Phosphatase: 45 U/L (ref 38–126)
Anion gap: 12 (ref 5–15)
BUN: 13 mg/dL (ref 6–20)
CO2: 21 mmol/L — ABNORMAL LOW (ref 22–32)
Calcium: 8.4 mg/dL — ABNORMAL LOW (ref 8.9–10.3)
Chloride: 104 mmol/L (ref 98–111)
Creatinine, Ser: 0.62 mg/dL (ref 0.44–1.00)
GFR calc Af Amer: >60 mL/min (ref >60)
GFR calc non Af Amer: >60 mL/min (ref >60)
Glucose, Bld: 79 mg/dL (ref 70–99)
Potassium: 3.7 mmol/L (ref 3.5–5.1)
Sodium: 137 mmol/L (ref 135–145)
Total Bilirubin: 0.5 mg/dL (ref 0.3–1.2)
Total Protein: 6.9 g/dL (ref 6.5–8.1)

## 2019-11-17 NOTE — Plan of Care (Signed)
  Problem: Coping: Goal: Ability to verbalize frustrations and anger appropriately will improve Outcome: Progressing   Problem: Education: Goal: Knowledge of risk factors and measures for prevention of condition will improve Outcome: Progressing   Problem: Respiratory: Goal: Will maintain a patent airway Outcome: Progressing

## 2019-11-17 NOTE — Progress Notes (Signed)
[facility] NAME: Emily Eaton    MR#:  [id number]  DATE OF BIRTH:  [birth year]  SUBJECTIVE:  CHIEF COMPLAINT:   Chief Complaint  Patient presents with  . Shortness of Breath  Shortness of breath, cough improving, tired REVIEW OF SYSTEMS:  Review of Systems  Constitutional: Positive for malaise/fatigue. Negative for diaphoresis, fever and weight loss.  HENT: Negative for ear discharge, ear pain, hearing loss, nosebleeds, sore throat and tinnitus.   Eyes: Negative for blurred vision and pain.  Respiratory: Positive for cough and shortness of breath. Negative for hemoptysis and wheezing.   Cardiovascular: Negative for chest pain, palpitations, orthopnea and leg swelling.  Gastrointestinal: Negative for abdominal pain, blood in stool, constipation, diarrhea, heartburn, nausea and vomiting.  Genitourinary: Negative for dysuria, frequency and urgency.  Musculoskeletal: Negative for back pain and myalgias.  Skin: Negative for itching and rash.  Neurological: Negative for dizziness, tingling, tremors, focal weakness, seizures, weakness and headaches.  Psychiatric/Behavioral: Negative for depression. The patient is not nervous/anxious.     DRUG ALLERGIES:   Allergies  Allergen Reactions  . Codeine Itching and Rash   VITALS:  Blood pressure 123/81, pulse 64, temperature 98.3 F (36.8 C), temperature source Oral, resp. rate 18, height 5\' 5"  (1.651 m), weight 100.3 kg, last menstrual period 11/14/2019, SpO2 97 %. PHYSICAL EXAMINATION:  Physical Exam HENT:     Head: Normocephalic and atraumatic.  Eyes:     Conjunctiva/sclera: Conjunctivae normal.     Pupils: Pupils are equal, round, and reactive to light.  Neck:     Musculoskeletal: Normal range of motion and neck supple.     Thyroid: No thyromegaly.     Trachea: No tracheal deviation.  Cardiovascular:     Rate and Rhythm: Normal rate and regular rhythm.     Heart sounds: Normal heart  sounds.  Pulmonary:     Effort: Pulmonary effort is normal. No respiratory distress.     Breath sounds: Normal breath sounds. No wheezing.  Chest:     Chest wall: No tenderness.  Abdominal:     General: Bowel sounds are normal. There is no distension.     Palpations: Abdomen is soft.     Tenderness: There is no abdominal tenderness.  Musculoskeletal: Normal range of motion.  Skin:    General: Skin is warm and dry.     Findings: No rash.  Neurological:     Mental Status: She is alert and oriented to person, place, and time.     Cranial Nerves: No cranial nerve deficit.    LABORATORY PANEL:  Female CBC Recent Labs  Lab 11/17/19 0618  WBC 4.6  HGB 11.6*  HCT 36.1  PLT 265   ------------------------------------------------------------------------------------------------------------------ Chemistries  Recent Labs  Lab 11/17/19 0618  NA 137  K 3.7  CL 104  CO2 21*  GLUCOSE 79  BUN 13  CREATININE 0.62  CALCIUM 8.4*  AST 17  ALT 12  ALKPHOS 45  BILITOT 0.5   RADIOLOGY:  No results found. ASSESSMENT AND PLAN:   Coronavirus pneumonitis without acute hypoxic respiratory failure In setting of ongoing 2020 COVID-19 pandemic. - Patient has bilateral opacities on chest x-ray, SPO2 less than 94% on admission on room air.  Wean oxygen as tolerated  -Start remdesivir day 4/5  -Dexamethasone 6 mg IV daily -for/10 days -VTE PPx with Lovenox -Continue Zinc and Vitamin C -Daily d-dimer, ferritin and CRP -Robitussin or Tussionex for  cough -Flutter valve, turn cough and I-S, as well as proning as able  Lymphopenia Due to Covid  Microcytosis without anemia -Check ferritin after resolution of Covid   Physical and Occupational Therapy evaluation  DVT prophylaxis: Lovenox   All the records are reviewed and case discussed with Care Management/Social Worker. Management plans discussed with the patient, nursing, (tried calling ex husband but no luck) and they are in  agreement.  CODE STATUS: Full Code  TOTAL TIME TAKING CARE OF THIS PATIENT: 35 minutes.   More than 50% of the time was spent in counseling/coordination of care: YES  POSSIBLE D/C IN 1 DAYS, DEPENDING ON CLINICAL CONDITION.   Max Sane M.D on 11/17/2019 at 11:25 AM  Between 7am to 6pm - Pager - [phone]  After 6pm go to [email or web address] - password [email or web address]  [facility]   CC: Primary care physician; Emily Sizer, MD  Note: This dictation was prepared with Dragon dictation along with smaller phrase technology. Any transcriptional errors that result from this process are unintentional.

## 2019-11-17 NOTE — Evaluation (Signed)
Physical Therapy Evaluation Patient Details Name: Emily Eaton MRN: [id number] DOB: [birth year] Today's Date: 11/17/2019   History of Present Illness  43 y.o. F with no significant past medical history who presents with influenza-like illness for 1 week, now shortness of breath.  Pt with positive Covid test.  Clinical Impression  Pt did well with PT exam and was able to ambulate >200 ft in the room on room air with O2 remaining in the 90s t/o the effort.  Pt was on 2L on arrival with sats ~95%, she did not drop below 90 t/o the effort. She showed very good strength and balance with testing and apart from feeling generally tired/weak she showed ability to go home safely when she is medically stable.  She reports that she has family and friends that will be able to assist with out of home errands, etc.  Pt does not require further PT intervention and she agrees with this, we discussed activities and likely progression once home.  No further PT needs, orders will be discontinued.    Follow Up Recommendations No PT follow up    Equipment Recommendations  None recommended by PT    Recommendations for Other Services       Precautions / Restrictions Precautions Precautions: None Restrictions Weight Bearing Restrictions: No      Mobility  Bed Mobility Overal bed mobility: Independent                Transfers Overall transfer level: Independent Equipment used: None             General transfer comment: Able to rise to standing w/o assist of safety issues  Ambulation/Gait Ambulation/Gait assistance: Independent Gait Distance (Feet): 200 Feet Assistive device: None       General Gait Details: Pt was able to ambulate in the room with good confidence and though she was initially a little hesitant and needed to focus on breathing once she was comfortable she was able to ambulate with community appropriate speed and good cadence. Pt was able to keep O2 in the 90s t/o the  effort on room air.  Sats fluctuated from low to mid 90s.  Stairs            Wheelchair Mobility    Modified Rankin (Stroke Patients Only)       Balance Overall balance assessment: Independent(able to SLS, rise heels & manage EC perturbations w/o issue)                                           Pertinent Vitals/Pain Pain Assessment: No/denies pain    Home Living Family/patient expects to be discharged to:: Private residence Living Arrangements: Alone Available Help at Discharge: Family;Friend(s)   Home Access: Stairs to enter Entrance Stairs-Rails: Right Entrance Stairs-Number of Steps: 3          Prior Function Level of Independence: Independent         Comments: Pt works 2 jobs, both of which she is standing/active, able to be independent w/o issue     Journalist, newspaper        Extremity/Trunk Assessment   Upper Extremity Assessment Upper Extremity Assessment: Overall WFL for tasks assessed    Lower Extremity Assessment Lower Extremity Assessment: Overall WFL for tasks assessed       Communication   Communication: No difficulties  Cognition Arousal/Alertness: Awake/alert Behavior During Therapy:  WFL for tasks assessed/performed Overall Cognitive Status: Within Functional Limits for tasks assessed                                        General Comments      Exercises     Assessment/Plan    PT Assessment Patent does not need any further PT services  PT Problem List         PT Treatment Interventions      PT Goals (Current goals can be found in the Care Plan section)  Acute Rehab PT Goals Patient Stated Goal: go home and reconver PT Goal Formulation: All assessment and education complete, DC therapy    Frequency     Barriers to discharge        Co-evaluation               AM-PAC PT "6 Clicks" Mobility  Outcome Measure Help needed turning from your back to your side while in a flat bed  without using bedrails?: None Help needed moving from lying on your back to sitting on the side of a flat bed without using bedrails?: None Help needed moving to and from a bed to a chair (including a wheelchair)?: None Help needed standing up from a chair using your arms (e.g., wheelchair or bedside chair)?: None Help needed to walk in hospital room?: None Help needed climbing 3-5 steps with a railing? : None 6 Click Score: 24    End of Session   Activity Tolerance: Patient tolerated treatment well Patient left: in bed;with call bell/phone within reach Nurse Communication: Mobility status PT Visit Diagnosis: Muscle weakness (generalized) (M62.81);Difficulty in walking, not elsewhere classified (R26.2)    Time: 1533-1610 PT Time Calculation (min) (ACUTE ONLY): 37 min   Charges:   PT Evaluation $PT Eval Low Complexity: 1 Low PT Treatments $Gait Training: 8-22 mins        Kreg Shropshire, DPT 11/17/2019, 5:34 PM

## 2019-11-17 NOTE — Progress Notes (Signed)
Patient sitting on side of bed talking on phone when writer entered the room. Patient stated she felt much better since getting up and walking with physical therapy, and had gone to the bathroom once without her oxygen and did not feel bad afterward. Patient has o2 at bedside and understands it is okay to put it on at anytime she feels it is necessary, but states she will try to use her incentive spirometer and be up in the room without it.   Ambulatory o2 sat 95% on 2L Resting o2 sat 93% on room air Ambulatory o2 sat slowly dropped to 90 on room air Heart rate slowly climbed to 108 while ambulating.

## 2019-11-18 DIAGNOSIS — U071 COVID-19: Secondary | ICD-10-CM

## 2019-11-18 DIAGNOSIS — J9601 Acute respiratory failure with hypoxia: Secondary | ICD-10-CM

## 2019-11-18 LAB — CBC WITH DIFFERENTIAL/PLATELET
Abs Immature Granulocytes: 0.01 10*3/uL (ref 0.00–0.07)
Basophils Absolute: 0 10*3/uL (ref 0.0–0.1)
Basophils Relative: 0 %
Eosinophils Absolute: 0 10*3/uL (ref 0.0–0.5)
Eosinophils Relative: 0 %
HCT: 35.5 % — ABNORMAL LOW (ref 36.0–46.0)
Hemoglobin: 11.4 g/dL — ABNORMAL LOW (ref 12.0–15.0)
Immature Granulocytes: 0 %
Lymphocytes Relative: 50 %
Lymphs Abs: 2.6 10*3/uL (ref 0.7–4.0)
MCH: 24.6 pg — ABNORMAL LOW (ref 26.0–34.0)
MCHC: 32.1 g/dL (ref 30.0–36.0)
MCV: 76.5 fL — ABNORMAL LOW (ref 80.0–100.0)
Monocytes Absolute: 0.5 10*3/uL (ref 0.1–1.0)
Monocytes Relative: 9 %
Neutro Abs: 2.2 10*3/uL (ref 1.7–7.7)
Neutrophils Relative %: 41 %
Platelets: 295 10*3/uL (ref 150–400)
RBC: 4.64 MIL/uL (ref 3.87–5.11)
RDW: 12.7 % (ref 11.5–15.5)
WBC: 5.3 10*3/uL (ref 4.0–10.5)
nRBC: 0 % (ref 0.0–0.2)

## 2019-11-18 LAB — COMPREHENSIVE METABOLIC PANEL
ALT: 14 U/L (ref 0–44)
AST: 17 U/L (ref 15–41)
Albumin: 3 g/dL — ABNORMAL LOW (ref 3.5–5.0)
Alkaline Phosphatase: 45 U/L (ref 38–126)
Anion gap: 8 (ref 5–15)
BUN: 12 mg/dL (ref 6–20)
CO2: 25 mmol/L (ref 22–32)
Calcium: 8.3 mg/dL — ABNORMAL LOW (ref 8.9–10.3)
Chloride: 105 mmol/L (ref 98–111)
Creatinine, Ser: 0.55 mg/dL (ref 0.44–1.00)
GFR calc Af Amer: >60 mL/min (ref >60)
GFR calc non Af Amer: >60 mL/min (ref >60)
Glucose, Bld: 94 mg/dL (ref 70–99)
Potassium: 3.7 mmol/L (ref 3.5–5.1)
Sodium: 138 mmol/L (ref 135–145)
Total Bilirubin: 0.3 mg/dL (ref 0.3–1.2)
Total Protein: 6.8 g/dL (ref 6.5–8.1)

## 2019-11-18 LAB — GLUCOSE, CAPILLARY: Glucose-Capillary: 147 mg/dL — ABNORMAL HIGH (ref 70–99)

## 2019-11-18 MED ORDER — PREDNISONE 10 MG (21) PO TBPK: ORAL_TABLET | ORAL | 0 refills | Status: DC

## 2019-11-18 MED ORDER — ASCORBIC ACID 500 MG PO TABS: 500.0000 mg | ORAL_TABLET | Freq: Every day | ORAL | 0 refills | Status: AC

## 2019-11-18 MED ORDER — ZINC SULFATE 220 (50 ZN) MG PO CAPS: 220.0000 mg | ORAL_CAPSULE | Freq: Every day | ORAL | 0 refills | Status: DC

## 2019-11-18 NOTE — Discharge Instructions (Signed)
COVID-19: How to Protect Yourself and Others Know how it spreads  There is currently no vaccine to prevent coronavirus disease 2019 (COVID-19).  The best way to prevent illness is to avoid being exposed to this virus.  The virus is thought to spread mainly from person-to-person. ? Between people who are in close contact with one another (within about 6 feet). ? Through respiratory droplets produced when an infected person coughs, sneezes or talks. ? These droplets can land in the mouths or noses of people who are nearby or possibly be inhaled into the lungs. ? Some recent studies have suggested that COVID-19 may be spread by people who are not showing symptoms. Everyone should Clean your hands often  Wash your hands often with soap and water for at least 20 seconds especially after you have been in a public place, or after blowing your nose, coughing, or sneezing.  If soap and water are not readily available, use a hand sanitizer that contains at least 60% alcohol. Cover all surfaces of your hands and rub them together until they feel dry.  Avoid touching your eyes, nose, and mouth with unwashed hands. Avoid close contact  Stay home if you are sick.  Avoid close contact with people who are sick.  Put distance between yourself and other people. ? Remember that some people without symptoms may be able to spread virus. ? This is especially important for people who are at higher risk of getting very GainPain.com.cy Cover your mouth and nose with a cloth face cover when around others  You could spread COVID-19 to others even if you do not feel sick.  Everyone should wear a cloth face cover when they have to go out in public, for example to the grocery store or to pick up other necessities. ? Cloth face coverings should not be placed on young children under age 82, anyone who has trouble breathing, or is unconscious,  incapacitated or otherwise unable to remove the mask without assistance.  The cloth face cover is meant to protect other people in case you are infected.  Do NOT use a facemask meant for a Dietitian.  Continue to keep about 6 feet between yourself and others. The cloth face cover is not a substitute for social distancing. Cover coughs and sneezes  If you are in a private setting and do not have on your cloth face covering, remember to always cover your mouth and nose with a tissue when you cough or sneeze or use the inside of your elbow.  Throw used tissues in the trash.  Immediately wash your hands with soap and water for at least 20 seconds. If soap and water are not readily available, clean your hands with a hand sanitizer that contains at least 60% alcohol. Clean and disinfect  Clean AND disinfect frequently touched surfaces daily. This includes tables, doorknobs, light switches, countertops, handles, desks, phones, keyboards, toilets, faucets, and sinks. RackRewards.fr  If surfaces are dirty, clean them: Use detergent or soap and water prior to disinfection.  Then, use a household disinfectant. You can see a list of EPA-registered household disinfectants here. [email or web address].com 04/23/2019 This information is not intended to replace advice given to you by your health care provider. Make sure you discuss any questions you have with your health care provider. Document Released: 04/02/2019 Document Revised: 05/01/2019 Document Reviewed: 04/02/2019 Elsevier Patient Education  2020 [employer].     COVID-19 Frequently Asked Questions COVID-19 (coronavirus disease) is an infection that is caused by  a large family of viruses. Some viruses cause illness in people and others cause illness in animals like camels, cats, and bats. In some cases, the viruses that cause illness in animals can spread to humans. Where  did the coronavirus come from? In December 2019, [place] told the [facility]) of several cases of lung disease (human respiratory illness). These cases were linked to an open seafood and livestock market in the city of [place]. The link to the seafood and livestock market suggests that the virus may have spread from animals to humans. However, since that first outbreak in December, the virus has also been shown to spread from person to person. What is the name of the disease and the virus? Disease name Early on, this disease was called novel coronavirus. This is because scientists determined that the disease was caused by a new (novel) respiratory virus. The World Health Organization [facility]) has now named the disease COVID-19, or coronavirus disease. Virus name The virus that causes the disease is called severe acute respiratory syndrome coronavirus 2 (SARS-CoV-2). More information on disease and virus naming World Health Organization [facility]): www.who.int/emergencies/diseases/novel-coronavirus-2019/technical-guidance/naming-the-coronavirus-disease-(covid-2019)-and-the-virus-that-causes-it Who is at risk for complications from coronavirus disease? Some people may be at higher risk for complications from coronavirus disease. This includes older adults and people who have chronic diseases, such as heart disease, diabetes, and lung disease. If you are at higher risk for complications, take these extra precautions:  Avoid close contact with people who are sick or have a fever or cough. Stay at least 3-6 ft (1-2 m) away from them, if possible.  Wash your hands often with soap and water for at least 20 seconds.  Avoid touching your face, mouth, nose, or eyes.  Keep supplies on hand at home, such as food, medicine, and cleaning supplies.  Stay home as much as possible.  Avoid social gatherings and travel. How does coronavirus disease spread? The virus that causes coronavirus disease  spreads easily from person to person (is contagious). There are also cases of community-spread disease. This means the disease has spread to:  People who have no known contact with other infected people.  People who have not traveled to areas where there are known cases. It appears to spread from one person to another through droplets from coughing or sneezing. Can I get the virus from touching surfaces or objects? There is still a lot that we do not know about the virus that causes coronavirus disease. Scientists are basing a lot of information on what they know about similar viruses, such as:  Viruses cannot generally survive on surfaces for long. They need a human body (host) to survive.  It is more likely that the virus is spread by close contact with people who are sick (direct contact), such as through: ? Shaking hands or hugging. ? Breathing in respiratory droplets that travel through the air. This can happen when an infected person coughs or sneezes on or near other people.  It is less likely that the virus is spread when a person touches a surface or object that has the virus on it (indirect contact). The virus may be able to enter the body if the person touches a surface or object and then touches his or her face, eyes, nose, or mouth. Can a person spread the virus without having symptoms of the disease? It may be possible for the virus to spread before a person has symptoms of the disease, but this is most likely not the main  way the virus is spreading. It is more likely for the virus to spread by being in close contact with people who are sick and breathing in the respiratory droplets of a sick person's cough or sneeze. What are the symptoms of coronavirus disease? Symptoms vary from person to person and can range from mild to severe. Symptoms may include:  Fever.  Cough.  Tiredness, weakness, or fatigue.  Fast breathing or feeling short of breath. These symptoms can appear  anywhere from 2 to 14 days after you have been exposed to the virus. If you develop symptoms, call your health care provider. People with severe symptoms may need hospital care. If I am exposed to the virus, how long does it take before symptoms start? Symptoms of coronavirus disease may appear anywhere from 2 to 14 days after a person has been exposed to the virus. If you develop symptoms, call your health care provider. Should I be tested for this virus? Your health care provider will decide whether to test you based on your symptoms, history of exposure, and your risk factors. How does a health care provider test for this virus? Health care providers will collect samples to send for testing. Samples may include:  Taking a swab of fluid from the nose.  Taking fluid from the lungs by having you cough up mucus (sputum) into a sterile cup.  Taking a blood sample.  Taking a stool or urine sample. Is there a treatment or vaccine for this virus? Currently, there is no vaccine to prevent coronavirus disease. Also, there are no medicines like antibiotics or antivirals to treat the virus. A person who becomes sick is given supportive care, which means rest and fluids. A person may also relieve his or her symptoms by using over-the-counter medicines that treat sneezing, coughing, and runny nose. These are the same medicines that a person takes for the common cold. If you develop symptoms, call your health care provider. People with severe symptoms may need hospital care. What can I do to protect myself and my family from this virus?     You can protect yourself and your family by taking the same actions that you would take to prevent the spread of other viruses. Take the following actions:  Wash your hands often with soap and water for at least 20 seconds. If soap and water are not available, use alcohol-based hand sanitizer.  Avoid touching your face, mouth, nose, or eyes.  Cough or sneeze into  a tissue, sleeve, or elbow. Do not cough or sneeze into your hand or the air. ? If you cough or sneeze into a tissue, throw it away immediately and wash your hands.  Disinfect objects and surfaces that you frequently touch every day.  Avoid close contact with people who are sick or have a fever or cough. Stay at least 3-6 ft (1-2 m) away from them, if possible.  Stay home if you are sick, except to get medical care. Call your health care provider before you get medical care.  Make sure your vaccines are up to date. Ask your health care provider what vaccines you need. What should I do if I need to travel? Follow travel recommendations from your local health authority, the CDC, and WHO. Travel information and advice  Centers for Disease Control and Prevention (CDC): BodyEditor.hu  World Health Organization [facility]): ThirdIncome.ca Know the risks and take action to protect your health  You are at higher risk of getting coronavirus disease if you are  traveling to areas with an outbreak or if you are exposed to travelers from areas with an outbreak.  Wash your hands often and practice good hygiene to lower the risk of catching or spreading the virus. What should I do if I am sick? General instructions to stop the spread of infection  Wash your hands often with soap and water for at least 20 seconds. If soap and water are not available, use alcohol-based hand sanitizer.  Cough or sneeze into a tissue, sleeve, or elbow. Do not cough or sneeze into your hand or the air.  If you cough or sneeze into a tissue, throw it away immediately and wash your hands.  Stay home unless you must get medical care. Call your health care provider or local health authority before you get medical care.  Avoid public areas. Do not take public transportation, if possible.  If you can, wear a mask if you must go out of  the house or if you are in close contact with someone who is not sick. Keep your home clean  Disinfect objects and surfaces that are frequently touched every day. This may include: ? Counters and tables. ? Doorknobs and light switches. ? Sinks and faucets. ? Electronics such as phones, remote controls, keyboards, computers, and tablets.  Wash dishes in hot, soapy water or use a dishwasher. Air-dry your dishes.  Wash laundry in hot water. Prevent infecting other household members  Let healthy household members care for children and pets, if possible. If you have to care for children or pets, wash your hands often and wear a mask.  Sleep in a different bedroom or bed, if possible.  Do not share personal items, such as razors, toothbrushes, deodorant, combs, brushes, towels, and washcloths. Where to find more information Centers for Disease Control and Prevention (CDC)  Information and news updates: https://www.butler-gonzalez.com/ World Health Organization [facility])  Information and news updates: MissExecutive.com.ee  Coronavirus health topic: https://www.castaneda.info/  Questions and answers on COVID-19: OpportunityDebt.at  Global tracker: who.sprinklr.com American Academy of Pediatrics (AAP)  Information for families: www.healthychildren.org/English/health-issues/conditions/chest-lungs/Pages/2019-Novel-Coronavirus.aspx The coronavirus situation is changing rapidly. Check your local health authority website or the CDC and [facility] websites for updates and news. When should I contact a health care provider?  Contact your health care provider if you have symptoms of an infection, such as fever or cough, and you: ? Have been near anyone who is known to have coronavirus disease. ? Have come into contact with a person who is suspected to have coronavirus disease. ? Have traveled outside of the country. When  should I get emergency medical care?  Get help right away by calling your local emergency services (911 in the [place] if you have: ? Trouble breathing. ? Pain or pressure in your chest. ? Confusion. ? Blue-tinged lips and fingernails. ? Difficulty waking from sleep. ? Symptoms that get worse. Let the emergency medical personnel know if you think you have coronavirus disease. Summary  A new respiratory virus is spreading from person to person and causing COVID-19 (coronavirus disease).  The virus that causes COVID-19 appears to spread easily. It spreads from one person to another through droplets from coughing or sneezing.  Older adults and those with chronic diseases are at higher risk of disease. If you are at higher risk for complications, take extra precautions.  There is currently no vaccine to prevent coronavirus disease. There are no medicines, such as antibiotics or antivirals, to treat the virus.  You can protect yourself and your  family by washing your hands often, avoiding touching your face, and covering your coughs and sneezes. This information is not intended to replace advice given to you by your health care provider. Make sure you discuss any questions you have with your health care provider. Document Released: 04/02/2019 Document Revised: 04/02/2019 Document Reviewed: 04/02/2019 Elsevier Patient Education  2020 [employer].    COVID-19 COVID-19 is a respiratory infection that is caused by a virus called severe acute respiratory syndrome coronavirus 2 (SARS-CoV-2). The disease is also known as coronavirus disease or novel coronavirus. In some people, the virus may not cause any symptoms. In others, it may cause a serious infection. The infection can get worse quickly and can lead to complications, such as:  Pneumonia, or infection of the lungs.  Acute respiratory distress syndrome or ARDS. This is fluid build-up in the lungs.  Acute respiratory failure. This is a  condition in which there is not enough oxygen passing from the lungs to the body.  Sepsis or septic shock. This is a serious bodily reaction to an infection.  Blood clotting problems.  Secondary infections due to bacteria or fungus. The virus that causes COVID-19 is contagious. This means that it can spread from person to person through droplets from coughs and sneezes (respiratory secretions). What are the causes? This illness is caused by a virus. You may catch the virus by:  Breathing in droplets from an infected person's cough or sneeze.  Touching something, like a table or a doorknob, that was exposed to the virus (contaminated) and then touching your mouth, nose, or eyes. What increases the risk? Risk for infection You are more likely to be infected with this virus if you:  Live in or travel to an area with a COVID-19 outbreak.  Come in contact with a sick person who recently traveled to an area with a COVID-19 outbreak.  Provide care for or live with a person who is infected with COVID-19. Risk for serious illness You are more likely to become seriously ill from the virus if you:  Are 56 years of age or older.  Have a long-term disease that lowers your body's ability to fight infection (immunocompromised).  Live in a nursing home or long-term care facility.  Have a long-term (chronic) disease such as: ? Chronic lung disease, including chronic obstructive pulmonary disease or asthma ? Heart disease. ? Diabetes. ? Chronic kidney disease. ? Liver disease.  Are obese. What are the signs or symptoms? Symptoms of this condition can range from mild to severe. Symptoms may appear any time from 2 to 14 days after being exposed to the virus. They include:  A fever.  A cough.  Difficulty breathing.  Chills.  Muscle pains.  A sore throat.  Loss of taste or smell. Some people may also have stomach problems, such as nausea, vomiting, or diarrhea. Other people may not  have any symptoms of COVID-19. How is this diagnosed? This condition may be diagnosed based on:  Your signs and symptoms, especially if: ? You live in an area with a COVID-19 outbreak. ? You recently traveled to or from an area where the virus is common. ? You provide care for or live with a person who was diagnosed with COVID-19.  A physical exam.  Lab tests, which may include: ? A nasal swab to take a sample of fluid from your nose. ? A throat swab to take a sample of fluid from your throat. ? A sample of mucus from your  lungs (sputum). ? Blood tests.  Imaging tests, which may include, X-rays, CT scan, or ultrasound. How is this treated? At present, there is no medicine to treat COVID-19. Medicines that treat other diseases are being used on a trial basis to see if they are effective against COVID-19. Your health care provider will talk with you about ways to treat your symptoms. For most people, the infection is mild and can be managed at home with rest, fluids, and over-the-counter medicines. Treatment for a serious infection usually takes places in a hospital intensive care unit (ICU). It may include one or more of the following treatments. These treatments are given until your symptoms improve.  Receiving fluids and medicines through an IV.  Supplemental oxygen. Extra oxygen is given through a tube in the nose, a face mask, or a hood.  Positioning you to lie on your stomach (prone position). This makes it easier for oxygen to get into the lungs.  Continuous positive airway pressure (CPAP) or bi-level positive airway pressure (BPAP) machine. This treatment uses mild air pressure to keep the airways open. A tube that is connected to a motor delivers oxygen to the body.  Ventilator. This treatment moves air into and out of the lungs by using a tube that is placed in your windpipe.  Tracheostomy. This is a procedure to create a hole in the neck so that a breathing tube can be  inserted.  Extracorporeal membrane oxygenation (ECMO). This procedure gives the lungs a chance to recover by taking over the functions of the heart and lungs. It supplies oxygen to the body and removes carbon dioxide. Follow these instructions at home: Lifestyle  If you are sick, stay home except to get medical care. Your health care provider will tell you how long to stay home. Call your health care provider before you go for medical care.  Rest at home as told by your health care provider.  Do not use any products that contain nicotine or tobacco, such as cigarettes, e-cigarettes, and chewing tobacco. If you need help quitting, ask your health care provider.  Return to your normal activities as told by your health care provider. Ask your health care provider what activities are safe for you. General instructions  Take over-the-counter and prescription medicines only as told by your health care provider.  Drink enough fluid to keep your urine pale yellow.  Keep all follow-up visits as told by your health care provider. This is important. How is this prevented?  There is no vaccine to help prevent COVID-19 infection. However, there are steps you can take to protect yourself and others from this virus. To protect yourself:   Do not travel to areas where COVID-19 is a risk. The areas where COVID-19 is reported change often. To identify high-risk areas and travel restrictions, check the CDC travel website: FatFares.com.br  If you live in, or must travel to, an area where COVID-19 is a risk, take precautions to avoid infection. ? Stay away from people who are sick. ? Wash your hands often with soap and water for 20 seconds. If soap and water are not available, use an alcohol-based hand sanitizer. ? Avoid touching your mouth, face, eyes, or nose. ? Avoid going out in public, follow guidance from your state and local health authorities. ? If you must go out in public, wear a  cloth face covering or face mask. ? Disinfect objects and surfaces that are frequently touched every day. This may include:  Counters and tables.  Doorknobs and light switches.  Sinks and faucets.  Electronics, such as phones, remote controls, keyboards, computers, and tablets. To protect others: If you have symptoms of COVID-19, take steps to prevent the virus from spreading to others.  If you think you have a COVID-19 infection, contact your health care provider right away. Tell your health care team that you think you may have a COVID-19 infection.  Stay home. Leave your house only to seek medical care. Do not use public transport.  Do not travel while you are sick.  Wash your hands often with soap and water for 20 seconds. If soap and water are not available, use alcohol-based hand sanitizer.  Stay away from other members of your household. Let healthy household members care for children and pets, if possible. If you have to care for children or pets, wash your hands often and wear a mask. If possible, stay in your own room, separate from others. Use a different bathroom.  Make sure that all people in your household wash their hands well and often.  Cough or sneeze into a tissue or your sleeve or elbow. Do not cough or sneeze into your hand or into the air.  Wear a cloth face covering or face mask. Where to find more information  Centers for Disease Control and Prevention: PurpleGadgets.be  World Health Organization: https://www.castaneda.info/ Contact a health care provider if:  You live in or have traveled to an area where COVID-19 is a risk and you have symptoms of the infection.  You have had contact with someone who has COVID-19 and you have symptoms of the infection. Get help right away if:  You have trouble breathing.  You have pain or pressure in your chest.  You have confusion.  You have bluish lips and  fingernails.  You have difficulty waking from sleep.  You have symptoms that get worse. These symptoms may represent a serious problem that is an emergency. Do not wait to see if the symptoms will go away. Get medical help right away. Call your local emergency services (911 in the [place] Do not drive yourself to the hospital. Let the emergency medical personnel know if you think you have COVID-19. Summary  COVID-19 is a respiratory infection that is caused by a virus. It is also known as coronavirus disease or novel coronavirus. It can cause serious infections, such as pneumonia, acute respiratory distress syndrome, acute respiratory failure, or sepsis.  The virus that causes COVID-19 is contagious. This means that it can spread from person to person through droplets from coughs and sneezes.  You are more likely to develop a serious illness if you are 18 years of age or older, have a weak immunity, live in a nursing home, or have chronic disease.  There is no medicine to treat COVID-19. Your health care provider will talk with you about ways to treat your symptoms.  Take steps to protect yourself and others from infection. Wash your hands often and disinfect objects and surfaces that are frequently touched every day. Stay away from people who are sick and wear a mask if you are sick. This information is not intended to replace advice given to you by your health care provider. Make sure you discuss any questions you have with your health care provider. Document Released: 01/10/2019 Document Revised: 05/02/2019 Document Reviewed: 01/10/2019 Elsevier Patient Education  2020 [employer].

## 2019-11-18 NOTE — Plan of Care (Signed)
  Problem: Respiratory: Goal: Will maintain a patent airway Outcome: Progressing   

## 2019-11-18 NOTE — Progress Notes (Signed)
OT Cancellation Note  Patient Details Name: Emily Eaton MRN: [id number] DOB: 1976-03-04   Cancelled Treatment:    Reason Eval/Treat Not Completed: OT screened, no needs identified, will sign off. Consult received, chart reviewed. Per chart and PT evaluation, pt doing well. Spoke with RN who confirms pt has been independent with ADL and in agreement that there are no acute OT needs at this time. RN notes pt feels comfortable with everything as well. Will sign off. Please re-consult if additional acute OT needs arise.   Jeni Salles, MPH, MS, OTR/L ascom [phone] 11/18/19, 9:30 AM

## 2019-11-18 NOTE — Plan of Care (Signed)
  Problem: Education: Goal: Knowledge of [facility] Education information/materials will improve Outcome: Adequate for Discharge Goal: Emotional status will improve Outcome: Adequate for Discharge Goal: Mental status will improve Outcome: Adequate for Discharge Goal: Verbalization of understanding the information provided will improve Outcome: Adequate for Discharge   Problem: Activity: Goal: Interest or engagement in activities will improve Outcome: Adequate for Discharge Goal: Sleeping patterns will improve Outcome: Adequate for Discharge   Problem: Coping: Goal: Ability to verbalize frustrations and anger appropriately will improve Outcome: Adequate for Discharge Goal: Ability to demonstrate self-control will improve Outcome: Adequate for Discharge   Problem: Health Behavior/Discharge Planning: Goal: Identification of resources available to assist in meeting health care needs will improve Outcome: Adequate for Discharge Goal: Compliance with treatment plan for underlying cause of condition will improve Outcome: Adequate for Discharge   Problem: Physical Regulation: Goal: Ability to maintain clinical measurements within normal limits will improve Outcome: Adequate for Discharge   Problem: Safety: Goal: Periods of time without injury will increase Outcome: Adequate for Discharge   Problem: Education: Goal: Knowledge of risk factors and measures for prevention of condition will improve Outcome: Adequate for Discharge   Problem: Coping: Goal: Psychosocial and spiritual needs will be supported Outcome: Adequate for Discharge   Problem: Respiratory: Goal: Will maintain a patent airway Outcome: Adequate for Discharge Goal: Complications related to the disease process, condition or treatment will be avoided or minimized Outcome: Adequate for Discharge

## 2019-11-18 NOTE — Discharge Summary (Signed)
[facility] [place] NAME: Emily Eaton    MR#:  [id number]  DATE OF BIRTH:  1976/09/22  DATE OF ADMISSION:  11/14/2019   ADMITTING PHYSICIAN: Edwin Dada, MD  DATE OF DISCHARGE: 11/18/2019  2:06 PM  PRIMARY CARE PHYSICIAN: Steele Sizer, MD   ADMISSION DIAGNOSIS:  Acute respiratory failure with hypoxia ([place]) [J96.01] COVID-19 [U07.1] DISCHARGE DIAGNOSIS:  Principal Problem:   Pneumonia due to COVID-19 virus Active Problems:   Acute respiratory failure with hypoxia ([place])   COVID-19  SECONDARY DIAGNOSIS:   Past Medical History:  Diagnosis Date  . Acute low back pain   . ADD (attention deficit disorder) without hyperactivity   . Anemia   . Chronic constipation   . Herpes simplex without complication   . History of ovarian cyst 04/2010  . Internal hemorrhoids   . Iron deficiency anemia due to chronic blood loss   . PMS (premenstrual syndrome)   . Vitamin D deficiency    HOSPITAL COURSE:  Coronavirus pneumonitis withoutacute hypoxic respiratory failure Improved with IV remdesevir, steroids. On room air now. Being D/C on steroid taper, Vit C & Zinc. Explained home COVID monitoring, quarantine, given work excuse and outpt pulmo f/up in 2 weeks  Lymphopenia Due to Covid  Microcytosis without anemia  DISCHARGE CONDITIONS:  Stable CONSULTS OBTAINED:   DRUG ALLERGIES:   Allergies  Allergen Reactions  . Codeine Itching and Rash   DISCHARGE MEDICATIONS:   Allergies as of 11/18/2019      Reactions   Codeine Itching, Rash      Medication List    STOP taking these medications   azithromycin 250 MG tablet Commonly known as: Zithromax Z-Pak     TAKE these medications   albuterol 108 (90 Base) MCG/ACT inhaler Commonly known as: VENTOLIN HFA Inhale 1 puff into the lungs every 4 (four) hours as needed for wheezing or shortness of breath.   ascorbic acid 500 MG tablet Commonly known as: VITAMIN C Take 1 tablet (500  mg total) by mouth daily.   benzonatate 100 MG capsule Commonly known as: Tessalon Perles Take 1 capsule (100 mg total) by mouth 3 (three) times daily as needed for cough.   cyclobenzaprine 5 MG tablet Commonly known as: FLEXERIL Take 1 tablet (5 mg total) by mouth at bedtime. What changed:   when to take this  reasons to take this   fluticasone 50 MCG/ACT nasal spray Commonly known as: FLONASE Place 1-2 sprays into both nostrils daily as needed for allergies or rhinitis.   ibuprofen 800 MG tablet Commonly known as: ADVIL Take 1 tablet (800 mg total) by mouth every 8 (eight) hours as needed.   predniSONE 10 MG (21) Tbpk tablet Commonly known as: STERAPRED UNI-PAK 21 TAB Start 60 mg p.o. daily, taper 10 mg daily until done   Vitamin D 50 MCG (2000 UT) Caps Take 1 capsule (2,000 Units total) by mouth daily.   zinc sulfate 220 (50 Zn) MG capsule Take 1 capsule (220 mg total) by mouth daily.        DISCHARGE INSTRUCTIONS:   DIET:  Regular diet DISCHARGE CONDITION:  Good ACTIVITY:  Activity as tolerated OXYGEN:  Home Oxygen: No.  Oxygen Delivery: room air DISCHARGE LOCATION:  home   If you experience worsening of your admission symptoms, develop shortness of breath, life threatening emergency, suicidal or homicidal thoughts you must seek medical attention immediately by calling 911 or calling your MD immediately  if symptoms less severe.  You Must read complete instructions/literature along with all the possible adverse reactions/side effects for all the Medicines you take and that have been prescribed to you. Take any new Medicines after you have completely understood and accpet all the possible adverse reactions/side effects.   Please note  You were cared for by a hospitalist during your hospital stay. If you have any questions about your discharge medications or the care you received while you were in the hospital after you are discharged, you can call the unit  and asked to speak with the hospitalist on call if the hospitalist that took care of you is not available. Once you are discharged, your primary care physician will handle any further medical issues. Please note that NO REFILLS for any discharge medications will be authorized once you are discharged, as it is imperative that you return to your primary care physician (or establish a relationship with a primary care physician if you do not have one) for your aftercare needs so that they can reassess your need for medications and monitor your lab values.    On the day of Discharge:  VITAL SIGNS:  Blood pressure 125/85, pulse 65, temperature 98 F (36.7 C), temperature source Oral, resp. rate 17, height 5\' 5"  (1.651 m), weight 100.3 kg, last menstrual period 11/14/2019, SpO2 90 %. PHYSICAL EXAMINATION:  GENERAL:  43 y.o.-year-old patient lying in the bed with no acute distress.  EYES: Pupils equal, round, reactive to light and accommodation. No scleral icterus. Extraocular muscles intact.  HEENT: Head atraumatic, normocephalic. Oropharynx and nasopharynx clear.  NECK:  Supple, no jugular venous distention. No thyroid enlargement, no tenderness.  LUNGS: Normal breath sounds bilaterally, no wheezing, rales,rhonchi or crepitation. No use of accessory muscles of respiration.  CARDIOVASCULAR: S1, S2 normal. No murmurs, rubs, or gallops.  ABDOMEN: Soft, non-tender, non-distended. Bowel sounds present. No organomegaly or mass.  EXTREMITIES: No pedal edema, cyanosis, or clubbing.  NEUROLOGIC: Cranial nerves II through XII are intact. Muscle strength 5/5 in all extremities. Sensation intact. Gait not checked.  PSYCHIATRIC: The patient is alert and oriented x 3.  SKIN: No obvious rash, lesion, or ulcer.  DATA REVIEW:   CBC Recent Labs  Lab 11/18/19 0428  WBC 5.3  HGB 11.4*  HCT 35.5*  PLT 295    Chemistries  Recent Labs  Lab 11/18/19 0428  NA 138  K 3.7  CL 105  CO2 25  GLUCOSE 94  BUN 12   CREATININE 0.55  CALCIUM 8.3*  AST 17  ALT 14  ALKPHOS 45  BILITOT 0.3     Microbiology Results   COVID + on 11.26.2020  Management plans discussed with the patient, family and they are in agreement.  CODE STATUS: Prior   TOTAL TIME TAKING CARE OF THIS PATIENT: 45 minutes.    Max Sane M.D on 11/18/2019 at 8:26 PM  Between 7am to 6pm - Pager - [phone]  After 6pm go to [email or web address] - password [email or web address]  [facility]   CC: Primary care physician; Steele Sizer, MD   Note: This dictation was prepared with Dragon dictation along with smaller phrase technology. Any transcriptional errors that result from this process are unintentional.

## 2019-11-18 NOTE — Progress Notes (Signed)
Patient given discharge instructions with Covid instructions. Work note printed and given out. Patient verbalized understanding without any questions or concerns. Patient will eat lunch and then discharge.

## 2019-11-19 ENCOUNTER — Encounter (INDEPENDENT_AMBULATORY_CARE_PROVIDER_SITE_OTHER): Payer: Self-pay

## 2019-11-19 ENCOUNTER — Telehealth: Payer: Self-pay

## 2019-11-19 LAB — CULTURE, BLOOD (ROUTINE X 2)
Culture: NO GROWTH
Culture: NO GROWTH
Special Requests: ADEQUATE

## 2019-11-19 NOTE — Telephone Encounter (Signed)
Transition Care Management Follow-up Telephone Call  Date of discharge and from where: 11/18/19 [facility]  How have you been since you were released from the hospital? Pt states she is still feeling tired and weak but doing okay, denies fever, some cough and chest congestion.   Any questions or concerns? No    Patient enrolled in home monitoring program for Covid-19.  Items Reviewed:  Did the pt receive and understand the discharge instructions provided? Yes   Medications obtained and verified? Yes   Any new allergies since your discharge? No   Dietary orders reviewed? Yes  Do you have support at home? Yes   Functional Questionnaire: (I = Independent and D = Dependent) ADLs: I  Bathing/Dressing- I  Meal Prep- I  Eating- I  Maintaining continence- I  Transferring/Ambulation- I  Managing Meds- I  Follow up appointments reviewed:   PCP Hospital f/u appt confirmed? Yes  Scheduled to see Dr. Ancil Boozer on 11/27/19 @ 2:40 -- doximity video call. Marland Kitchen  [facility] f/u appt confirmed? No  Scheduled to   Are transportation arrangements needed? No   If their condition worsens, is the pt aware to call PCP or go to the Emergency Dept.? Yes  Was the patient provided with contact information for the PCP's office or ED? Yes  Was to pt encouraged to call back with questions or concerns? Yes

## 2019-11-20 ENCOUNTER — Encounter (INDEPENDENT_AMBULATORY_CARE_PROVIDER_SITE_OTHER): Payer: Self-pay

## 2019-11-21 ENCOUNTER — Encounter (INDEPENDENT_AMBULATORY_CARE_PROVIDER_SITE_OTHER): Payer: Self-pay

## 2019-11-22 ENCOUNTER — Encounter (INDEPENDENT_AMBULATORY_CARE_PROVIDER_SITE_OTHER): Payer: Self-pay

## 2019-11-22 ENCOUNTER — Encounter: Payer: Self-pay | Admitting: Family Medicine

## 2019-11-23 ENCOUNTER — Encounter (INDEPENDENT_AMBULATORY_CARE_PROVIDER_SITE_OTHER): Payer: Self-pay

## 2019-11-23 ENCOUNTER — Telehealth: Payer: Self-pay

## 2019-11-23 ENCOUNTER — Telehealth: Payer: BC Managed Care – PPO | Admitting: Family

## 2019-11-23 DIAGNOSIS — R519 Headache, unspecified: Secondary | ICD-10-CM

## 2019-11-23 NOTE — Telephone Encounter (Signed)
Called pt and pt c/o headache to the right side of her head. Pt stated that she just took a dose of ES Tylenol. Advised pt that headache is also a sx of dehydration. Advised pt to increase fluid intake to at least 6-8 8 oz glasses of fluid. Pt stated that she is more fatigued today. Advised pt that is she has worsening weakness or if she has inability to stand or if unable to get balance to call 911. Pt verbalized understanding.

## 2019-11-23 NOTE — Progress Notes (Signed)
Unfortunately, because of the nature of headaches and not being able to safely evaluate a headache, we will need to consider a video visit of be seen in person.   Based on what you shared with me, I feel your condition warrants further evaluation and I recommend that you be seen for a face to face visit.  Please contact your primary care physician practice to be seen. Many offices offer virtual options to be seen via video if you are not comfortable going in person to a medical facility at this time.  If you do not have a PCP, [facility] offers a free physician referral service available at [phone]. Our trained staff has the experience, knowledge and resources to put you in touch with a physician who is right for you.   You also have the option of a video visit through [email or web address]  If you are having a true medical emergency please call 911.  NOTE: If you entered your credit card information for this eVisit, you will not be charged. You may see a "hold" on your card for the $35 but that hold will drop off and you will not have a charge processed.  Your e-visit answers were reviewed by a board certified advanced clinical practitioner to complete your personal care plan.  Thank you for using e-Visits.

## 2019-11-24 ENCOUNTER — Telehealth: Payer: Self-pay

## 2019-11-24 ENCOUNTER — Encounter (INDEPENDENT_AMBULATORY_CARE_PROVIDER_SITE_OTHER): Payer: Self-pay

## 2019-11-24 NOTE — Telephone Encounter (Signed)
Pain in legs developed yesterday bilateral thighs all the way down to legs. Pain comes and goes. Taking Tylenol helps a little. No blurred vision. Still c/o right side headache. No weakness to legs except with walking. No swelling noted. Chest pain above right breast that started yesterday Pain is 6-7/10- achyness, does have weakness with walking. Is able to walk without assistance, not having numbness or weakness anywhere else. Headache is a 5/10. Advised pt that if she worsens go to the ED. Advised pt that if she has any visual changes, worsening headache, Numbness or weakness to her face arms or legs, of if chest pain is persistent and worsens to call 911.  Pt verbalized understanding.

## 2019-11-25 ENCOUNTER — Encounter (INDEPENDENT_AMBULATORY_CARE_PROVIDER_SITE_OTHER): Payer: Self-pay

## 2019-11-26 ENCOUNTER — Encounter: Payer: Self-pay | Admitting: Family Medicine

## 2019-11-26 ENCOUNTER — Encounter (INDEPENDENT_AMBULATORY_CARE_PROVIDER_SITE_OTHER): Payer: Self-pay

## 2019-11-27 ENCOUNTER — Encounter: Payer: Self-pay | Admitting: Family Medicine

## 2019-11-27 ENCOUNTER — Ambulatory Visit (INDEPENDENT_AMBULATORY_CARE_PROVIDER_SITE_OTHER): Payer: BC Managed Care – PPO | Admitting: Family Medicine

## 2019-11-27 ENCOUNTER — Other Ambulatory Visit: Payer: Self-pay

## 2019-11-27 ENCOUNTER — Telehealth: Payer: Self-pay | Admitting: Family Medicine

## 2019-11-27 ENCOUNTER — Encounter (INDEPENDENT_AMBULATORY_CARE_PROVIDER_SITE_OTHER): Payer: Self-pay

## 2019-11-27 DIAGNOSIS — R519 Headache, unspecified: Secondary | ICD-10-CM

## 2019-11-27 DIAGNOSIS — F5102 Adjustment insomnia: Secondary | ICD-10-CM | POA: Diagnosis not present

## 2019-11-27 DIAGNOSIS — Z09 Encounter for follow-up examination after completed treatment for conditions other than malignant neoplasm: Secondary | ICD-10-CM

## 2019-11-27 MED ORDER — TEMAZEPAM 7.5 MG PO CAPS: 7.5000 mg | ORAL_CAPSULE | Freq: Every evening | ORAL | 0 refills | Status: DC | PRN

## 2019-11-27 NOTE — Telephone Encounter (Signed)
Per sowles the patient has called in and said that the sleep medication she called in for her is way to expensive and sowles said to see if you could look up and find something less expensive.

## 2019-11-27 NOTE — Progress Notes (Signed)
Name: Emily Eaton   MRN: [id number]    DOB: Aug 17, 1976   Date:11/27/2019       Progress Note  Subjective  Chief Complaint  Chief Complaint  Patient presents with  . Hospitalization Follow-up    I connected with  Emily Eaton  on 11/27/19 at  2:40 PM EST by a telephone encounter  and verified that I am speaking with the correct person using two identifiers.  I discussed the limitations of evaluation and management by telemedicine and the availability of in person appointments. The patient expressed understanding and agreed to proceed. Staff also discussed with the patient that there may be a patient responsible charge related to this service. Patient Location: at home  Provider Location: [facility] Discharge Follow up: Emily Eaton developed flu like symptoms on 11/08/2019, fever, chills, malaise, sinus congestion, cough, diarrhea, she went to [facility] 11/22, but one day later she developed  SOB and decided to go to [facility] .  Upon arrival to [facility] she was tachypnenic and pulse ox was 92 % on Room air , temperature . She tested positive for COVID-19, had bilateral opacities on CXR and mildlymphopenia. She was admitted and given Remdesivir for 5 days and also Decadron, started on Zinc and  vitamin D and cough medication. She was admitted on 11/26 and discharged on 11/18/2019. She was sent home on prednisone 21 day pack taper, ventolin and benzonatate perles. Since discharge she has been feeling better. Cough has resolved, she has noticed some rhinorrhea since last night - re-started again. She has some temporal headache on right side only intermittently, she states headaches is about 6/10, she has noticed some blurred vision intermittent but no other neuro deficit.. She states she has not been able to sleep, she goes to bed around 11 pm and is staying up until 5 am, symptoms started about one week ago. Advised to go for walks.   Patient Active Problem List   Diagnosis Date Noted  . Acute respiratory failure with hypoxia ([place])   . COVID-19   . Pneumonia due to COVID-19 virus 11/14/2019  . GERD (gastroesophageal reflux disease) 09/11/2019  . Other neutropenia ([place]) 05/03/2019  . Pre-diabetes 07/25/2018  . Dyslipidemia 07/25/2018  . Mild major depression ([place]) 07/24/2018  . Tension headache 07/24/2018  . Stiffness of right hand joint 07/28/2015  . ADD (attention deficit hyperactivity disorder, inattentive type) 07/25/2015  . Pain in toe 07/25/2015  . Chronic constipation 07/25/2015  . Herpes 07/25/2015  . Hemorrhoids, internal 07/25/2015  . Obesity (BMI 30-39.9) 07/25/2015  . Vitamin D deficiency 07/25/2015  . Radiculitis of right cervical region 04/14/2015  . Epicondylitis elbow, medial 10/02/2013  . Entrapment of ulnar nerve 10/02/2013  . Impingement syndrome of shoulder 10/02/2013  . Ovarian cyst 11/15/2011    Past Surgical History:  Procedure Laterality Date  . ENDOMETRIAL BIOPSY    . NOVASURE ABLATION    . OVARIAN CYST REMOVAL     left  . TUBAL LIGATION    . WISDOM TOOTH EXTRACTION     x 1    Family History  Problem Relation Age of Onset  . Diabetes Mother   . Depression Mother   . Hypertension Mother   . Kidney disease Mother   . Hyperlipidemia Mother   . Dementia Mother   . Stroke Mother        March 18, 2018 (currently in rehab)   . Hypertension Father   .  Diabetes Father   . Liver disease Father   . Prostate cancer Father   . Asthma Brother   . Cancer Maternal Uncle        prostate  . Thrombocytopenia Son        ITP  . ADD / ADHD Son     Social History   Socioeconomic History  . Marital status: Divorced    Spouse name: Dorthula Rue  . Number of children: 2  . Years of education: 9  . Highest education level: Associate degree: occupational, Hotel manager, or vocational program  Occupational History  . Occupation: Pharmacist, hospital    CommentBuilding surveyor  . Occupation: Chemical engineer: [employer]  . Financial resource strain: Not hard at all  . Food insecurity    Worry: Never true    Inability: Never true  . Transportation needs    Medical: No    Non-medical: No  Tobacco Use  . Smoking status: Never Smoker  . Smokeless tobacco: Never Used  Substance and Sexual Activity  . Alcohol use: Yes    Alcohol/week: 2.0 standard drinks    Types: 2 Standard drinks or equivalent per week    Comment: occassionally  . Drug use: No  . Sexual activity: Yes    Partners: Male    Birth control/protection: Condom, Surgical    Comment: Tubaligation  Lifestyle  . Physical activity    Days per week: 0 days    Minutes per session: 0 min  . Stress: Rather much  Relationships  . Social connections    Talks on phone: More than three times a week    Gets together: Twice a week    Attends religious service: Never    Active member of club or organization: No    Attends meetings of clubs or organizations: Never    Relationship status: Divorced  . Intimate partner violence    Fear of current or ex partner: No    Emotionally abused: No    Physically abused: No    Forced sexual activity: No  Other Topics Concern  . Not on file  Social History Narrative   Divorced July 5th, 2019, but they are still sexually active   Son is in the [employer] going to [place]  from 07/2018 to 06/2019, he is at home right now, he will return to his job end of the year   Youngest son is at home   Mother in [place] after stroke in 2019   Father diagnosed with prostate cancer 04/2019     Current Outpatient Medications:  .  Cholecalciferol (VITAMIN D) 50 MCG (2000 UT) CAPS, Take 1 capsule (2,000 Units total) by mouth daily., Disp: 30 capsule, Rfl: 0 .  cyclobenzaprine (FLEXERIL) 5 MG tablet, Take 1 tablet (5 mg total) by mouth at bedtime. (Patient taking differently: Take 5 mg by mouth at bedtime as needed for muscle spasms. ), Disp: 30 tablet, Rfl: 2 .  fluticasone (FLONASE) 50 MCG/ACT nasal  spray, Place 1-2 sprays into both nostrils daily as needed for allergies or rhinitis. , Disp: , Rfl:  .  vitamin C (VITAMIN C) 500 MG tablet, Take 1 tablet (500 mg total) by mouth daily., Disp: 30 tablet, Rfl: 0 .  zinc sulfate 220 (50 Zn) MG capsule, Take 1 capsule (220 mg total) by mouth daily., Disp: 30 capsule, Rfl: 0 .  albuterol (VENTOLIN HFA) 108 (90 Base) MCG/ACT inhaler, Inhale 1 puff into the lungs every  4 (four) hours as needed for wheezing or shortness of breath., Disp: , Rfl:  .  benzonatate (TESSALON PERLES) 100 MG capsule, Take 1 capsule (100 mg total) by mouth 3 (three) times daily as needed for cough. (Patient not taking: Reported on 11/27/2019), Disp: 20 capsule, Rfl: 0 .  ibuprofen (ADVIL) 800 MG tablet, Take 1 tablet (800 mg total) by mouth every 8 (eight) hours as needed. (Patient not taking: Reported on 11/27/2019), Disp: 21 tablet, Rfl: 0 .  predniSONE (STERAPRED UNI-PAK 21 TAB) 10 MG (21) TBPK tablet, Start 60 mg p.o. daily, taper 10 mg daily until done (Patient not taking: Reported on 11/27/2019), Disp: 21 tablet, Rfl: 0  Allergies  Allergen Reactions  . Codeine Itching and Rash    I personally reviewed active problem list, medication list, allergies, family history, social history, health maintenance with the patient/caregiver today.   ROS  Ten systems reviewed and is negative except as mentioned in HPI   Objective  Virtual encounter, vitals not obtained.  There is no height or weight on file to calculate BMI.  Physical Exam  Awake, alert and oriented  PHQ2/9: Depression screen [facility] 2/9 11/27/2019 09/11/2019 05/03/2019 11/29/2018 08/30/2018  Decreased Interest 0 0 1 1 1   Down, Depressed, Hopeless 1 1 2 1 1   PHQ - 2 Score 1 1 3 2 2   Altered sleeping 2 1 3 2 2   Tired, decreased energy 2 1 2 2 1   Change in appetite 0 1 2 2  0  Feeling bad or failure about yourself  0 0 0 1 0  Trouble concentrating 0 1 0 1 0  Moving slowly or fidgety/restless 0 0 2 1 0  Suicidal  thoughts 0 0 0 0 0  PHQ-9 Score 5 5 12 11 5   Difficult doing work/chores Not difficult at all Not difficult at all Somewhat difficult Not difficult at all Somewhat difficult   PHQ-2/9 Result is positive.    Fall Risk: Fall Risk  11/27/2019 09/11/2019 05/03/2019 11/29/2018 08/30/2018  Falls in the past year? 0 0 0 0 No  Number falls in past yr: 0 0 0 0 -  Injury with Fall? 0 0 0 0 -  Comment - - - - -     Assessment & Plan  1. Hospital discharge follow-up  Reviewed labs and symptoms.   2. Adjustment insomnia  Discussed love and kindness meditation, melatonin and daily walks in the sun to make her more tired, also routine prior to bed time, take Temazepam prn if other changes don't help her - temazepam (RESTORIL) 7.5 MG capsule; Take 1 capsule (7.5 mg total) by mouth at bedtime as needed for sleep.  Dispense: 30 capsule; Refill: 0  3. Right-sided headache  Advised to avoid nsaid's, stay hydrated, monitor for neuro deficit, hopefully once she is able to sleep through the night symptoms will subside  I discussed the assessment and treatment plan with the patient. The patient was provided an opportunity to ask questions and all were answered. The patient agreed with the plan and demonstrated an understanding of the instructions.  The patient was advised to call back or seek an in-person evaluation if the symptoms worsen or if the condition fails to improve as anticipated.  I provided 25  minutes of non-face-to-face time during this encounter.

## 2019-11-28 ENCOUNTER — Encounter (INDEPENDENT_AMBULATORY_CARE_PROVIDER_SITE_OTHER): Payer: Self-pay

## 2019-11-28 ENCOUNTER — Other Ambulatory Visit: Payer: Self-pay | Admitting: Internal Medicine

## 2019-11-28 ENCOUNTER — Other Ambulatory Visit: Payer: Self-pay | Admitting: Family Medicine

## 2019-11-28 DIAGNOSIS — F5102 Adjustment insomnia: Secondary | ICD-10-CM

## 2019-11-28 DIAGNOSIS — U071 COVID-19: Secondary | ICD-10-CM

## 2019-11-28 DIAGNOSIS — J1282 Pneumonia due to coronavirus disease 2019: Secondary | ICD-10-CM

## 2019-11-28 MED ORDER — TEMAZEPAM 15 MG PO CAPS: 15.0000 mg | ORAL_CAPSULE | Freq: Every evening | ORAL | 0 refills | Status: DC | PRN

## 2019-11-29 ENCOUNTER — Encounter (INDEPENDENT_AMBULATORY_CARE_PROVIDER_SITE_OTHER): Payer: Self-pay

## 2019-11-30 ENCOUNTER — Telehealth: Payer: Self-pay

## 2019-11-30 ENCOUNTER — Encounter (INDEPENDENT_AMBULATORY_CARE_PROVIDER_SITE_OTHER): Payer: Self-pay

## 2019-11-30 NOTE — Telephone Encounter (Signed)
Vomited yesterday. Not sure if the food she ate. Felt sick after eating. Pt is tolerating foods and fluids now. Pt instructed to continue with fluids and slowly work her way to bland foods such as crackers, pretzels, oatmeal, applesauce, toast. Pt advised to call PCP if unable to tolerate any fluids or food. Pt advised if vomiting becomes severe ( > 6 times per day or > than 8 hours and /or having severe abdominal pain to call 911. Pt verbalized understanding.

## 2019-12-01 ENCOUNTER — Encounter (INDEPENDENT_AMBULATORY_CARE_PROVIDER_SITE_OTHER): Payer: Self-pay

## 2019-12-04 DIAGNOSIS — Z20828 Contact with and (suspected) exposure to other viral communicable diseases: Secondary | ICD-10-CM | POA: Diagnosis not present

## 2019-12-16 ENCOUNTER — Other Ambulatory Visit: Payer: Self-pay

## 2019-12-16 ENCOUNTER — Encounter: Payer: Self-pay | Admitting: Family Medicine

## 2019-12-17 ENCOUNTER — Encounter: Payer: Self-pay | Admitting: Family Medicine

## 2019-12-17 ENCOUNTER — Ambulatory Visit (INDEPENDENT_AMBULATORY_CARE_PROVIDER_SITE_OTHER): Payer: BC Managed Care – PPO | Admitting: Family Medicine

## 2019-12-17 ENCOUNTER — Other Ambulatory Visit: Payer: Self-pay

## 2019-12-17 VITALS — Ht 66.0 in | Wt 221.0 lb

## 2019-12-17 DIAGNOSIS — R300 Dysuria: Secondary | ICD-10-CM | POA: Diagnosis not present

## 2019-12-17 DIAGNOSIS — N760 Acute vaginitis: Secondary | ICD-10-CM

## 2019-12-17 MED ORDER — METRONIDAZOLE 500 MG PO TABS: 500.0000 mg | ORAL_TABLET | Freq: Two times a day (BID) | ORAL | 0 refills | Status: DC

## 2019-12-17 NOTE — Progress Notes (Signed)
Name: Emily Eaton   MRN: [id number]    DOB: 03/13/1976   Date:12/17/2019       Progress Note  Subjective:    Chief Complaint  Chief Complaint  Patient presents with   Vaginitis    Patient has some itching and burning in her vagina. Onset 2 weeks with discharge, has tried otc cream with no releif    I connected with  SUDDIE STRUVE  on 12/17/19 at  2:00 PM EST by a video enabled telemedicine application and verified that I am speaking with the correct person using two identifiers.  I discussed the limitations of evaluation and management by telemedicine and the availability of in person appointments. The patient expressed understanding and agreed to proceed. Staff also discussed with the patient that there may be a patient responsible charge related to this service. Patient Location: home Provider Location: [facility] clinic Additional Individuals present: none  Vaginal Itching The patient's primary symptoms include genital itching, pelvic pain and vaginal discharge (yellow thin). The patient's pertinent negatives include no genital lesions, genital odor, genital rash, missed menses or vaginal bleeding. Primary symptoms comment: urinary burning . This is a new problem. Episode onset: 2 weeks. The problem occurs constantly. The problem has been unchanged. The pain is mild. She is not pregnant. Associated symptoms include dysuria. Pertinent negatives include no abdominal pain, anorexia, back pain, chills, constipation, diarrhea, discolored urine, fever, flank pain, frequency, headaches, hematuria, joint pain, joint swelling, nausea, painful intercourse, rash, sore throat, urgency or vomiting. The vaginal discharge was thin and yellow. There has been no bleeding. Treatments tried: otc 3 day midol w/o relief. Her past medical history is significant for vaginosis.  Pt recently hospitalized with COVID (over one month ago) did have respiratory failure and pneumonia, was tx with zpak and remdesivir,  decadron in the hospital and prednisone 2x outpt in the last month She does have hx of BV treated successfully with flagyl   Patient Active Problem List   Diagnosis Date Noted   Acute respiratory failure with hypoxia ([place])    COVID-19    Pneumonia due to COVID-19 virus 11/14/2019   GERD (gastroesophageal reflux disease) 09/11/2019   Other neutropenia ([place]) 05/03/2019   Pre-diabetes 07/25/2018   Dyslipidemia 07/25/2018   Mild major depression ([place]) 07/24/2018   Tension headache 07/24/2018   Stiffness of right hand joint 07/28/2015   ADD (attention deficit hyperactivity disorder, inattentive type) 07/25/2015   Pain in toe 07/25/2015   Chronic constipation 07/25/2015   Herpes 07/25/2015   Hemorrhoids, internal 07/25/2015   Obesity (BMI 30-39.9) 07/25/2015   Vitamin D deficiency 07/25/2015   Radiculitis of right cervical region 04/14/2015   Epicondylitis elbow, medial 10/02/2013   Entrapment of ulnar nerve 10/02/2013   Impingement syndrome of shoulder 10/02/2013   Ovarian cyst 11/15/2011    Social History   Tobacco Use   Smoking status: Never Smoker   Smokeless tobacco: Never Used  Substance Use Topics   Alcohol use: Yes    Alcohol/week: 2.0 standard drinks    Types: 2 Standard drinks or equivalent per week    Comment: occassionally     Current Outpatient Medications:    Cholecalciferol (VITAMIN D) 50 MCG (2000 UT) CAPS, Take 1 capsule (2,000 Units total) by mouth daily., Disp: 30 capsule, Rfl: 0   cyclobenzaprine (FLEXERIL) 5 MG tablet, Take 1 tablet (5 mg total) by mouth at bedtime. (Patient taking differently: Take 5 mg by mouth at bedtime as needed for muscle spasms. ),  Disp: 30 tablet, Rfl: 2   temazepam (RESTORIL) 15 MG capsule, Take 1 capsule (15 mg total) by mouth at bedtime as needed for sleep., Disp: 30 capsule, Rfl: 0   vitamin C (VITAMIN C) 500 MG tablet, Take 1 tablet (500 mg total) by mouth daily., Disp: 30 tablet, Rfl: 0   zinc  sulfate 220 (50 Zn) MG capsule, Take 1 capsule (220 mg total) by mouth daily., Disp: 30 capsule, Rfl: 0  Allergies  Allergen Reactions   Codeine Itching and Rash    I personally reviewed active problem list, medication list, allergies, family history, social history, health maintenance, notes from last encounter, lab results, imaging with the patient/caregiver today.  Review of Systems  Constitutional: Negative.  Negative for chills and fever.  HENT: Negative.  Negative for sore throat.   Eyes: Negative.   Respiratory: Negative.   Cardiovascular: Negative.   Gastrointestinal: Negative.  Negative for abdominal pain, anorexia, constipation, diarrhea, nausea and vomiting.  Endocrine: Negative.   Genitourinary: Positive for dysuria, pelvic pain and vaginal discharge (yellow thin). Negative for flank pain, frequency, hematuria, missed menses and urgency.  Musculoskeletal: Negative.  Negative for back pain and joint pain.  Skin: Negative.  Negative for rash.  Allergic/Immunologic: Negative.   Neurological: Negative.  Negative for headaches.  Hematological: Negative.   Psychiatric/Behavioral: Negative.   All other systems reviewed and are negative.    Objective:   Virtual encounter, vitals limited, only able to obtain the following Today's Vitals   12/17/19 1336  Weight: 221 lb (100.2 kg)  Height: 5\' 6"  (1.676 m)   Body mass index is 35.67 kg/m. Nursing Note and Vital Signs reviewed.  Physical Exam Vitals and nursing note reviewed.  Constitutional:      General: She is not in acute distress.    Appearance: Normal appearance. She is well-developed. She is not ill-appearing, toxic-appearing or diaphoretic.  HENT:     Head: Normocephalic and atraumatic.  Eyes:     General:        Right eye: No discharge.        Left eye: No discharge.     Conjunctiva/sclera: Conjunctivae normal.  Neck:     Trachea: No tracheal deviation.  Cardiovascular:     Rate and Rhythm: Normal rate.   Pulmonary:     Effort: Pulmonary effort is normal. No respiratory distress.     Breath sounds: No stridor.  Skin:    Coloration: Skin is not jaundiced or pale.     Findings: No rash.  Neurological:     Mental Status: She is alert.  Psychiatric:        Mood and Affect: Mood normal.        Behavior: Behavior normal.     PE limited by telephone encounter  No results found for this or any previous visit (from the past 72 hour(s)).  Assessment and Plan:     ICD-10-CM   1. Acute vaginitis  N76.0 Cervicovaginal ancillary only    Cytology (oral, anal, urethral) ancillary only   pt suspects BV since monistat did not improve sx, she prefers to tx for BV, to me clinically sounds more suspicious for yeast vaginitis. I have asked her to come by the clinic to do vaginal swab and urine testing.   She wanted to start flagyl so it was sent to the pharmacy, she declined to first try diflucan and then add flagyl if + for BV.     2. Dysuria  R30.0 Urine Culture  UA w/reflex microscopy, LAB   some dysuria and suprapubic pressure/discomfort, r/o UTI     -Red flags and when to present for emergency care or RTC including fever >101.23F, chest pain, shortness of breath, new/worsening/un-resolving symptoms,  reviewed with patient at time of visit. Follow up and care instructions discussed and provided in AVS. - I discussed the assessment and treatment plan with the patient. The patient was provided an opportunity to ask questions and all were answered. The patient agreed with the plan and demonstrated an understanding of the instructions.  I provided 15 minutes of non-face-to-face time during this encounter.  Delsa Grana, PA-C 12/17/19 2:19 PM

## 2019-12-25 ENCOUNTER — Encounter: Payer: Self-pay | Admitting: Family Medicine

## 2019-12-25 ENCOUNTER — Other Ambulatory Visit (HOSPITAL_COMMUNITY)
Admission: RE | Admit: 2019-12-25 | Discharge: 2019-12-25 | Disposition: A | Discharge status: Home / Self Care | Payer: BC Managed Care – PPO | Source: Ambulatory Visit | Attending: Family Medicine | Admitting: Family Medicine

## 2019-12-25 ENCOUNTER — Ambulatory Visit (INDEPENDENT_AMBULATORY_CARE_PROVIDER_SITE_OTHER): Payer: BC Managed Care – PPO | Admitting: Family Medicine

## 2019-12-25 DIAGNOSIS — K219 Gastro-esophageal reflux disease without esophagitis: Secondary | ICD-10-CM | POA: Diagnosis not present

## 2019-12-25 DIAGNOSIS — E559 Vitamin D deficiency, unspecified: Secondary | ICD-10-CM | POA: Diagnosis not present

## 2019-12-25 DIAGNOSIS — F5102 Adjustment insomnia: Secondary | ICD-10-CM

## 2019-12-25 DIAGNOSIS — E669 Obesity, unspecified: Secondary | ICD-10-CM

## 2019-12-25 DIAGNOSIS — N898 Other specified noninflammatory disorders of vagina: Secondary | ICD-10-CM

## 2019-12-25 DIAGNOSIS — R300 Dysuria: Secondary | ICD-10-CM

## 2019-12-25 DIAGNOSIS — F339 Major depressive disorder, recurrent, unspecified: Secondary | ICD-10-CM

## 2019-12-25 MED ORDER — PANTOPRAZOLE SODIUM 40 MG PO TBEC: 40.0000 mg | DELAYED_RELEASE_TABLET | Freq: Every day | ORAL | 1 refills | Status: DC

## 2019-12-25 NOTE — Progress Notes (Signed)
Name: Emily Eaton   MRN: [id number]    DOB: 08/19/1976   Date:12/25/2019       Progress Note  Subjective  Chief Complaint  Chief Complaint  Patient presents with  . Follow-up    1 month F/U  . Adjustment insomnia    states she stopped the temazepam 2 weeks ago, taking melation and doing well getting 6-8 hours of sleep a night    I connected with  Emily Eaton  on 12/25/19 at  2:40 PM EST by a video enabled telemedicine application and verified that I am speaking with the correct person using two identifiers.  I discussed the limitations of evaluation and management by telemedicine and the availability of in person appointments. The patient expressed understanding and agreed to proceed. Staff also discussed with the patient that there may be a patient responsible charge related to this service. Patient Location: at home Provider Location: [facility]   HPI  Major Depression: she has a long history of depression, she was doing better and stopped taking Celexa lain 2018She states mother had a major stroke, finalized her divorce 06/2018, younger son has ITP ( found out in 2018, he is doing better now )  older son that is in the [employer] is wasdeployed to [place]. Her mother is now hospitalized for COVID-19 Summer of 2020 and father diagnosed with prostate cancer, she also had COVID -7, mother is in a rehab facility and cannot see her, she has a good support system, she has been journaling and seems to help. Discussed therapy   Vaginal Discharge and dysuria , treated with Metronidazole, last dose was yesterday and she still has some dysuria and mild vaginal discharge but no odor, no pruritis.   Tension headaches: she states her head is in temporal area and sometimes nuchal area, described as tightness also has ear fullness and had an episode of dizziness - felt off balance, but no recent episodes with dizziness. No episodes in over 6 months    Insomnia: she took Temazepam, and is doing well on Melatonin ,she has been able to fall and stay asleep for about 6-8 hours. She has nocturia but is able to fall back asleep   Back pain: she states she has been working from home during the day and [employer] at night, states since pain has improved since not staying in the same position for a long time  Obesity: she has gained weight , she states her clothes has been tight now. She states she lost with COVID-19 but took prednisone and gained it all back plus more   GERD: she states since COVID-19 she has noticed worsening symptoms. Some indigestion. No heartburn . She needs refill of pantoprazole  Patient Active Problem List   Diagnosis Date Noted  . Acute respiratory failure with hypoxia ([place])   . COVID-19   . Pneumonia due to COVID-19 virus 11/14/2019  . GERD (gastroesophageal reflux disease) 09/11/2019  . Other neutropenia ([place]) 05/03/2019  . Pre-diabetes 07/25/2018  . Dyslipidemia 07/25/2018  . Mild major depression ([place]) 07/24/2018  . Tension headache 07/24/2018  . Stiffness of right hand joint 07/28/2015  . ADD (attention deficit hyperactivity disorder, inattentive type) 07/25/2015  . Pain in toe 07/25/2015  . Chronic constipation 07/25/2015  . Herpes 07/25/2015  . Hemorrhoids, internal 07/25/2015  . Obesity (BMI 30-39.9) 07/25/2015  . Vitamin D deficiency 07/25/2015  . Radiculitis of right cervical region 04/14/2015  . Epicondylitis elbow, medial 10/02/2013  .  Entrapment of ulnar nerve 10/02/2013  . Impingement syndrome of shoulder 10/02/2013  . Ovarian cyst 11/15/2011    Past Surgical History:  Procedure Laterality Date  . ENDOMETRIAL BIOPSY    . NOVASURE ABLATION    . OVARIAN CYST REMOVAL     left  . TUBAL LIGATION    . WISDOM TOOTH EXTRACTION     x 1    Family History  Problem Relation Age of Onset  . Diabetes Mother   . Depression Mother   . Hypertension Mother   . Kidney disease Mother   . Hyperlipidemia  Mother   . Dementia Mother   . Stroke Mother        March 18, 2018 (currently in rehab)   . Hypertension Father   . Diabetes Father   . Liver disease Father   . Prostate cancer Father   . Asthma Brother   . Cancer Maternal Uncle        prostate  . Thrombocytopenia Son        ITP  . ADD / ADHD Son     Social History   Socioeconomic History  . Marital status: Divorced    Spouse name: Dorthula Rue  . Number of children: 2  . Years of education: 79  . Highest education level: Associate degree: occupational, Hotel manager, or vocational program  Occupational History  . Occupation: Pharmacist, hospital    CommentBuilding surveyor  . 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  Tobacco Use  . Smoking status: Never Smoker  . Smokeless tobacco: Never Used  Substance and Sexual Activity  . Alcohol use: Yes    Alcohol/week: 2.0 standard drinks    Types: 2 Standard drinks or equivalent per week    Comment: occassionally  . Drug use: No  . Sexual activity: Yes    Partners: Male    Birth control/protection: Condom, Surgical    Comment: Tubaligation  Other Topics Concern  . Not on file  Social History Narrative   Divorced July 5th, 2019, but they are still sexually active   Son is in the [employer] going to [place]  from 07/2018 to 06/2019, he is at home right now, he will return to his job end of the year   Youngest son is at home   Mother in [employer] after stroke in 2019   Father diagnosed with prostate cancer 04/2019   Social Determinants of Health   Financial Resource Strain:   . Difficulty of Paying Living Expenses: Not on file  Food Insecurity:   . Worried About Charity fundraiser in the Last Year: Not on file  . Ran Out of Food in the Last Year: Not on file  Transportation Needs:   . Lack of Transportation (Medical): Not on file  . Lack of Transportation (Non-Medical): Not on file  Physical Activity:   . Days of Exercise per Week: Not on file  . Minutes of Exercise per  Session: Not on file  Stress:   . Feeling of Stress : Not on file  Social Connections:   . Frequency of Communication with Friends and Family: Not on file  . Frequency of Social Gatherings with Friends and Family: Not on file  . Attends Religious Services: Not on file  . Active Member of Clubs or Organizations: Not on file  . Attends Archivist Meetings: Not on file  . Marital Status: Not on file  Intimate Partner Violence:   . Fear of Current or  Ex-Partner: Not on file  . Emotionally Abused: Not on file  . Physically Abused: Not on file  . Sexually Abused: Not on file     Current Outpatient Medications:  .  Cholecalciferol (VITAMIN D) 50 MCG (2000 UT) CAPS, Take 1 capsule (2,000 Units total) by mouth daily., Disp: 30 capsule, Rfl: 0 .  cyclobenzaprine (FLEXERIL) 5 MG tablet, Take 1 tablet (5 mg total) by mouth at bedtime. (Patient taking differently: Take 5 mg by mouth at bedtime as needed for muscle spasms. ), Disp: 30 tablet, Rfl: 2 .  pantoprazole (PROTONIX) 40 MG tablet, , Disp: , Rfl:  .  metroNIDAZOLE (FLAGYL) 500 MG tablet, Take 1 tablet (500 mg total) by mouth 2 (two) times daily. (Patient not taking: Reported on 12/25/2019), Disp: 14 tablet, Rfl: 0 .  temazepam (RESTORIL) 15 MG capsule, Take 1 capsule (15 mg total) by mouth at bedtime as needed for sleep. (Patient not taking: Reported on 12/25/2019), Disp: 30 capsule, Rfl: 0 .  vitamin C (VITAMIN C) 500 MG tablet, Take 1 tablet (500 mg total) by mouth daily. (Patient not taking: Reported on 12/25/2019), Disp: 30 tablet, Rfl: 0 .  zinc sulfate 220 (50 Zn) MG capsule, Take 1 capsule (220 mg total) by mouth daily. (Patient not taking: Reported on 12/25/2019), Disp: 30 capsule, Rfl: 0  Allergies  Allergen Reactions  . Codeine Itching and Rash    I personally reviewed active problem list, medication list, allergies, family history, social history with the patient/caregiver today.   ROS    Objective  Virtual encounter,  vitals not obtained.  There is no height or weight on file to calculate BMI.  Physical Exam  Awake, alert and oriented   PHQ2/9: Depression screen [facility] 2/9 12/25/2019 12/17/2019 11/27/2019 09/11/2019 05/03/2019  Decreased Interest 1 0 0 0 1  Down, Depressed, Hopeless 0 0 1 1 2   PHQ - 2 Score 1 0 1 1 3   Altered sleeping 1 0 2 1 3   Tired, decreased energy 1 0 2 1 2   Change in appetite 2 0 0 1 2  Feeling bad or failure about yourself  0 0 0 0 0  Trouble concentrating 1 0 0 1 0  Moving slowly or fidgety/restless 1 0 0 0 2  Suicidal thoughts 0 0 0 0 0  PHQ-9 Score 7 0 5 5 12   Difficult doing work/chores Not difficult at all Not difficult at all Not difficult at all Not difficult at all Somewhat difficult  Some recent data might be hidden   PHQ-2/9 Result is positive.    Fall Risk: Fall Risk  12/25/2019 12/17/2019 11/27/2019 09/11/2019 05/03/2019  Falls in the past year? 0 0 0 0 0  Number falls in past yr: 0 0 0 0 0  Injury with Fall? 0 0 0 0 0  Comment - - - - -     Assessment & Plan  1. Gastroesophageal reflux disease without esophagitis  - pantoprazole (PROTONIX) 40 MG tablet; Take 1 tablet (40 mg total) by mouth daily.  Dispense: 90 tablet; Refill: 1  2. Adjustment insomnia  resolved  3. Vitamin D deficiency  Resume otc supplementation   4. Obesity (BMI 35.0-39.9 without comorbidity)  .Discussed with the patient the risk posed by an increased BMI. Discussed importance of portion control, calorie counting and at least 150 minutes of physical activity weekly. Avoid sweet beverages and drink more water. Eat at least 6 servings of fruit and vegetables daily   5. Dysuria  -  CULTURE, URINE COMPREHENSIVE  6. Vaginal discharge  - Cervicovaginal ancillary only  7. Major depression, recurrent, chronic ([place])  She will try counseling, also discussed love and kindness meditation   I discussed the assessment and treatment plan with the patient. The patient was provided an  opportunity to ask questions and all were answered. The patient agreed with the plan and demonstrated an understanding of the instructions.  The patient was advised to call back or seek an in-person evaluation if the symptoms worsen or if the condition fails to improve as anticipated.  I provided 25  minutes of non-face-to-face time during this encounter.

## 2019-12-30 DIAGNOSIS — R300 Dysuria: Secondary | ICD-10-CM | POA: Diagnosis not present

## 2019-12-31 LAB — CULTURE, URINE COMPREHENSIVE
MICRO NUMBER:: 10030773
SPECIMEN QUALITY:: ADEQUATE

## 2020-01-01 ENCOUNTER — Other Ambulatory Visit: Payer: Self-pay | Admitting: Family Medicine

## 2020-01-01 LAB — CERVICOVAGINAL ANCILLARY ONLY
Chlamydia: NEGATIVE
Comment: NEGATIVE
Comment: NORMAL
Neisseria Gonorrhea: NEGATIVE

## 2020-01-01 MED ORDER — SULFAMETHOXAZOLE-TRIMETHOPRIM 400-80 MG PO TABS: 1.0000 | ORAL_TABLET | Freq: Two times a day (BID) | ORAL | 0 refills | Status: DC

## 2020-03-24 ENCOUNTER — Encounter: Payer: Self-pay | Admitting: Family Medicine

## 2020-03-24 ENCOUNTER — Other Ambulatory Visit: Payer: Self-pay

## 2020-03-24 ENCOUNTER — Ambulatory Visit: Payer: BC Managed Care – PPO | Admitting: Family Medicine

## 2020-03-24 ENCOUNTER — Other Ambulatory Visit (HOSPITAL_COMMUNITY)
Admission: RE | Admit: 2020-03-24 | Discharge: 2020-03-24 | Disposition: A | Discharge status: Home / Self Care | Payer: 59 | Source: Ambulatory Visit | Attending: Family Medicine | Admitting: Family Medicine

## 2020-03-24 VITALS — BP 130/84 | HR 99 | Temp 97.7°F | Resp 16 | Ht 66.0 in | Wt 236.1 lb

## 2020-03-24 DIAGNOSIS — R21 Rash and other nonspecific skin eruption: Secondary | ICD-10-CM | POA: Diagnosis not present

## 2020-03-24 DIAGNOSIS — E785 Hyperlipidemia, unspecified: Secondary | ICD-10-CM

## 2020-03-24 DIAGNOSIS — Z8616 Personal history of COVID-19: Secondary | ICD-10-CM

## 2020-03-24 DIAGNOSIS — D649 Anemia, unspecified: Secondary | ICD-10-CM

## 2020-03-24 DIAGNOSIS — G44209 Tension-type headache, unspecified, not intractable: Secondary | ICD-10-CM

## 2020-03-24 DIAGNOSIS — N898 Other specified noninflammatory disorders of vagina: Secondary | ICD-10-CM

## 2020-03-24 DIAGNOSIS — E559 Vitamin D deficiency, unspecified: Secondary | ICD-10-CM

## 2020-03-24 DIAGNOSIS — F320 Major depressive disorder, single episode, mild: Secondary | ICD-10-CM

## 2020-03-24 DIAGNOSIS — E669 Obesity, unspecified: Secondary | ICD-10-CM

## 2020-03-24 DIAGNOSIS — K219 Gastro-esophageal reflux disease without esophagitis: Secondary | ICD-10-CM

## 2020-03-24 DIAGNOSIS — Z113 Encounter for screening for infections with a predominantly sexual mode of transmission: Secondary | ICD-10-CM

## 2020-03-24 MED ORDER — TRIAMCINOLONE ACETONIDE 0.1 % EX CREA: 1.0000 "application " | TOPICAL_CREAM | Freq: Two times a day (BID) | CUTANEOUS | 0 refills | Status: DC

## 2020-03-24 NOTE — Progress Notes (Signed)
Name: Emily Eaton   MRN: [id number]    DOB: 1976/02/20   Date:03/24/2020       Progress Note  Subjective  Chief Complaint  Chief Complaint  Patient presents with  . Covid Exposure    Follow up from Covid 19 exposure. She continues to have some fatigue.  . Rash    She has a rash on her lower left leg. That comes up as whiteheads and itches when it is dry.    HPI  History of COVID - 19: she was tested on 11/09/2020, she is still seeing pulmonologist and still has very mild SOB, no longer wheezing or coughing. Continues to have fatigue. Recent pulmonary function test showed mild restriction . She is back to work full time and has a part time job.   Rash on left anterior leg: started as white bumps on left lower leg, mild pruritis, she is keeping it moisturized, going on for about one month. Getting some black spots now  Major Depression: she has a long history of depression, she was doing better and stopped taking Celexa  2018She states mother had a major stroke, finalized her divorce 06/2018, younger son has ITP ( found out in 2018, he is doing better now) older son that is in the [employer] is wasdeployed to [place].Her mother was  hospitalized for COVID-19 Summer of 2020 and father diagnosed with prostate cancer. Mother is deteriorating but glad she can see her, father is done with cancer therapy, she is back to work and is feeling better emotionally.   Tension headaches: doing much better, it was severe during covid, now only associated with menstrual cycles and takes prn medication otc  Insomnia: she stopped Temazepam and is doing well on Melatonin otc prn. We will continue to monitor    Obesity: she has gained weight , she is frustrated about it.   GERD: symptoms are stable, taking pantoprazole  prn now , avoiding spicy food   Vaginal irritation: states sexually active with ex-husband and used new lube and noticed irritation, she would like to also be tested  for sti's  Patient Active Problem List   Diagnosis Date Noted  . Acute respiratory failure with hypoxia ([employer])   . COVID-19   . Pneumonia due to COVID-19 virus 11/14/2019  . GERD (gastroesophageal reflux disease) 09/11/2019  . Other neutropenia ([place]) 05/03/2019  . Pre-diabetes 07/25/2018  . Dyslipidemia 07/25/2018  . Mild major depression ([place]) 07/24/2018  . Tension headache 07/24/2018  . Stiffness of right hand joint 07/28/2015  . ADD (attention deficit hyperactivity disorder, inattentive type) 07/25/2015  . Pain in toe 07/25/2015  . Chronic constipation 07/25/2015  . Herpes 07/25/2015  . Hemorrhoids, internal 07/25/2015  . Obesity (BMI 30-39.9) 07/25/2015  . Vitamin D deficiency 07/25/2015  . Radiculitis of right cervical region 04/14/2015  . Epicondylitis elbow, medial 10/02/2013  . Entrapment of ulnar nerve 10/02/2013  . Impingement syndrome of shoulder 10/02/2013  . Ovarian cyst 11/15/2011    Past Surgical History:  Procedure Laterality Date  . ENDOMETRIAL BIOPSY    . NOVASURE ABLATION    . OVARIAN CYST REMOVAL     left  . TUBAL LIGATION    . WISDOM TOOTH EXTRACTION     x 1    Family History  Problem Relation Age of Onset  . Diabetes Mother   . Depression Mother   . Hypertension Mother   . Kidney disease Mother   . Hyperlipidemia Mother   . Dementia Mother   .  Stroke Mother        March 18, 2018 (currently in rehab)   . Hypertension Father   . Diabetes Father   . Liver disease Father   . Prostate cancer Father   . Asthma Brother   . Cancer Maternal Uncle        prostate  . Thrombocytopenia Son        ITP  . ADD / ADHD Son     Social History   Tobacco Use  . Smoking status: Never Smoker  . Smokeless tobacco: Never Used  Substance Use Topics  . Alcohol use: Yes    Alcohol/week: 2.0 standard drinks    Types: 2 Standard drinks or equivalent per week    Comment: occassionally     Current Outpatient Medications:  .  Cholecalciferol (VITAMIN D)  50 MCG (2000 UT) CAPS, Take 1 capsule (2,000 Units total) by mouth daily., Disp: 30 capsule, Rfl: 0 .  cyclobenzaprine (FLEXERIL) 5 MG tablet, Take 1 tablet (5 mg total) by mouth at bedtime. (Patient taking differently: Take 5 mg by mouth at bedtime as needed for muscle spasms. ), Disp: 30 tablet, Rfl: 2 .  pantoprazole (PROTONIX) 40 MG tablet, Take 1 tablet (40 mg total) by mouth daily., Disp: 90 tablet, Rfl: 1 .  vitamin C (VITAMIN C) 500 MG tablet, Take 1 tablet (500 mg total) by mouth daily., Disp: 30 tablet, Rfl: 0 .  zinc sulfate 220 (50 Zn) MG capsule, Take 1 capsule (220 mg total) by mouth daily., Disp: 30 capsule, Rfl: 0 .  sulfamethoxazole-trimethoprim (BACTRIM) 400-80 MG tablet, Take 1 tablet by mouth 2 (two) times daily. (Patient not taking: Reported on 03/24/2020), Disp: 10 tablet, Rfl: 0  Allergies  Allergen Reactions  . Codeine Itching and Rash    I personally reviewed active problem list, medication list, allergies, family history, social history, health maintenance with the patient/caregiver today.   ROS  Constitutional: Negative for fever, positive for  weight change.  Respiratory: Negative for cough , positive for  shortness of breath.   Cardiovascular: Negative for chest pain or palpitations.  Gastrointestinal: Negative for abdominal pain, no bowel changes.  Musculoskeletal: Negative for gait problem or joint swelling.  Skin: Negative for rash.  Neurological: Negative for dizziness , intermittent  headache.  No other specific complaints in a complete review of systems (except as listed in HPI above).  Objective  Vitals:   03/24/20 1515  BP: 130/84  Pulse: 99  Resp: 16  Temp: 97.7 F (36.5 C)  TempSrc: Temporal  SpO2: 100%  Weight: 236 lb 1.6 oz (107.1 kg)  Height: 5\' 6"  (1.676 m)    Body mass index is 38.11 kg/m.  Physical Exam  Constitutional: Patient appears well-developed and well-nourished. Obese  No distress.  HEENT: head atraumatic,  normocephalic, pupils equal and reactive to light Cardiovascular: Normal rate, regular rhythm and normal heart sounds.  No murmur heard. No BLE edema. Pulmonary/Chest: Effort normal and breath sounds normal. No respiratory distress. Abdominal: Soft.  There is no tenderness. Psychiatric: Patient has a normal mood and affect. behavior is normal. Judgment and thought content normal. Skin: bumpy rash with some erythema and hyperpigmentation on left anterior shin   Recent Results (from the past 2160 hour(s))  Cervicovaginal ancillary only     Status: None   Collection Time: 12/25/19  3:23 PM  Result Value Ref Range   Neisseria Gonorrhea Negative    Chlamydia Negative    Comment Normal Reference [place] Chlamydia - Negative  Comment      Normal Reference Range Neisseria Gonorrhea - Negative  CULTURE, URINE COMPREHENSIVE     Status: Abnormal   Collection Time: 12/30/19  2:34 PM   Specimen: Urine  Result Value Ref Range   MICRO NUMBER: [id number]    SPECIMEN QUALITY: Adequate    Source OTHER (SPECIFY)    STATUS: FINAL    ISOLATE 1: Gram positive cocci isolated (A)     Comment: 10,000-49,000 CFU/mL of Gram positive cocci isolated Gram positive cocci isolated Gram positive cocci isolated May represent colonizers from external and internal genitalia. No further testing (including susceptibility) will be performed.      PHQ2/9: Depression screen [facility] 2/9 12/25/2019 12/17/2019 11/27/2019 09/11/2019 05/03/2019  Decreased Interest 1 0 0 0 1  Down, Depressed, Hopeless 0 0 1 1 2   PHQ - 2 Score 1 0 1 1 3   Altered sleeping 1 0 2 1 3   Tired, decreased energy 1 0 2 1 2   Change in appetite 2 0 0 1 2  Feeling bad or failure about yourself  0 0 0 0 0  Trouble concentrating 1 0 0 1 0  Moving slowly or fidgety/restless 1 0 0 0 2  Suicidal thoughts 0 0 0 0 0  PHQ-9 Score 7 0 5 5 12   Difficult doing work/chores Not difficult at all Not difficult at all Not difficult at all Not difficult at all Somewhat  difficult  Some recent data might be hidden    phq 9 is positive   Fall Risk: Fall Risk  12/25/2019 12/17/2019 11/27/2019 09/11/2019 05/03/2019  Falls in the past year? 0 0 0 0 0  Number falls in past yr: 0 0 0 0 0  Injury with Fall? 0 0 0 0 0  Comment - - - - -      Assessment & Plan  1. Rash in adult  - triamcinolone cream (KENALOG) 0.1 %; Apply 1 application topically 2 (two) times daily.  Dispense: 30 g; Refill: 0 - Ambulatory referral to Dermatology  2. Obesity (BMI 30-39.9)  Discussed with the patient the risk posed by an increased BMI. Discussed importance of portion control, calorie counting and at least 150 minutes of physical activity weekly. Avoid sweet beverages and drink more water. Eat at least 6 servings of fruit and vegetables daily   3. Vitamin D deficiency   4. History of COVID-19  Getting better, back to work, keep follow up with pulmonologist   5. Vaginal discharge  - Cervicovaginal ancillary only  6. Anemia, unspecified type  - CBC with Differential/Platelet  7. Dyslipidemia   8. Gastroesophageal reflux disease without esophagitis   9. Mild major depression (HCC)  - COMPLETE METABOLIC PANEL WITH GFR  10. Tension headache   11. Routine screening for STI (sexually transmitted infection)  - HIV Antibody (routine testing w rflx)

## 2020-03-25 LAB — CBC WITH DIFFERENTIAL/PLATELET
Absolute Monocytes: 432 cells/uL (ref 200–950)
Basophils Absolute: 18 cells/uL (ref 0–200)
Basophils Relative: 0.4 %
Eosinophils Absolute: 60 cells/uL (ref 15–500)
Eosinophils Relative: 1.3 %
HCT: 38.8 % (ref 35.0–45.0)
Hemoglobin: 12.4 g/dL (ref 11.7–15.5)
Lymphs Abs: 2098 cells/uL (ref 850–3900)
MCH: 25.9 pg — ABNORMAL LOW (ref 27.0–33.0)
MCHC: 32 g/dL (ref 32.0–36.0)
MCV: 81.2 fL (ref 80.0–100.0)
MPV: 9.6 fL (ref 7.5–12.5)
Monocytes Relative: 9.4 %
Neutro Abs: 1992 cells/uL (ref 1500–7800)
Neutrophils Relative %: 43.3 %
Platelets: 277 10*3/uL (ref 140–400)
RBC: 4.78 10*6/uL (ref 3.80–5.10)
RDW: 12.5 % (ref 11.0–15.0)
Total Lymphocyte: 45.6 %
WBC: 4.6 10*3/uL (ref 3.8–10.8)

## 2020-03-25 LAB — COMPLETE METABOLIC PANEL WITH GFR
AG Ratio: 1.3 (calc) (ref 1.0–2.5)
ALT: 7 U/L (ref 6–29)
AST: 11 U/L (ref 10–35)
Albumin: 3.7 g/dL (ref 3.6–5.1)
Alkaline phosphatase (APISO): 58 U/L (ref 31–125)
BUN: 9 mg/dL (ref 7–25)
CO2: 27 mmol/L (ref 20–32)
Calcium: 9.2 mg/dL (ref 8.6–10.2)
Chloride: 103 mmol/L (ref 98–110)
Creat: 0.89 mg/dL (ref 0.50–1.10)
GFR, Est African American: 91 mL/min/{1.73_m2} (ref >=60)
GFR, Est Non African American: 78 mL/min/{1.73_m2} (ref >=60)
Globulin: 2.9 g/dL (calc) (ref 1.9–3.7)
Glucose, Bld: 92 mg/dL (ref 65–99)
Potassium: 4.1 mmol/L (ref 3.5–5.3)
Sodium: 137 mmol/L (ref 135–146)
Total Bilirubin: 0.3 mg/dL (ref 0.2–1.2)
Total Protein: 6.6 g/dL (ref 6.1–8.1)

## 2020-03-25 LAB — HIV ANTIBODY (ROUTINE TESTING W REFLEX): HIV 1&2 Ab, 4th Generation: NONREACTIVE

## 2020-03-26 ENCOUNTER — Other Ambulatory Visit: Payer: Self-pay | Admitting: Family Medicine

## 2020-03-26 DIAGNOSIS — B379 Candidiasis, unspecified: Secondary | ICD-10-CM

## 2020-03-26 DIAGNOSIS — B9689 Other specified bacterial agents as the cause of diseases classified elsewhere: Secondary | ICD-10-CM

## 2020-03-26 LAB — CERVICOVAGINAL ANCILLARY ONLY
Bacterial Vaginitis (gardnerella): POSITIVE — AB
Candida Glabrata: NEGATIVE
Candida Vaginitis: POSITIVE — AB
Chlamydia: NEGATIVE
Comment: NEGATIVE
Comment: NEGATIVE
Comment: NEGATIVE
Comment: NEGATIVE
Comment: NEGATIVE
Comment: NORMAL
Neisseria Gonorrhea: NEGATIVE
Trichomonas: NEGATIVE

## 2020-03-26 MED ORDER — FLUCONAZOLE 150 MG PO TABS: 150.0000 mg | ORAL_TABLET | ORAL | 0 refills | Status: DC

## 2020-03-26 MED ORDER — METRONIDAZOLE 500 MG PO TABS: 500.0000 mg | ORAL_TABLET | Freq: Two times a day (BID) | ORAL | 0 refills | Status: DC

## 2020-03-30 ENCOUNTER — Other Ambulatory Visit: Payer: Self-pay

## 2020-03-30 ENCOUNTER — Ambulatory Visit: Payer: 59 | Admitting: Dermatology

## 2020-03-30 DIAGNOSIS — L819 Disorder of pigmentation, unspecified: Secondary | ICD-10-CM | POA: Diagnosis not present

## 2020-03-30 DIAGNOSIS — L739 Follicular disorder, unspecified: Secondary | ICD-10-CM | POA: Diagnosis not present

## 2020-03-30 MED ORDER — DOXYCYCLINE MONOHYDRATE 100 MG PO TABS: ORAL_TABLET | ORAL | 0 refills | Status: DC

## 2020-03-30 MED ORDER — MUPIROCIN 2 % EX OINT: TOPICAL_OINTMENT | CUTANEOUS | 0 refills | Status: DC

## 2020-03-30 NOTE — Progress Notes (Signed)
   New Patient Visit  Subjective  Emily Eaton is a 44 y.o. female who presents for the following: Rash (Bumps on front of left leg. They appear like little white heads then they itch and turn into little black spots. Patient is using moisturizer then was given TMC 0.1% cream by PCP ).  Objective  Well appearing patient in no apparent distress; mood and affect are within normal limits.  A focused examination was performed including legs. Relevant physical exam findings are noted in the Assessment and Plan.  Objective  Left Lower Leg - Anterior: Spotty hyperpigmentation on left pretibial.   Assessment & Plan  Folliculitis with dyschromia of the left pretibial area Left Lower Leg - Anterior Advised has potential for recurrence and she can have colonization of bacteria on her skin and in her nose, usually Staphylococcus species   Start doxycycline 100mg  1 PO BID with food x 1 week #60 0RF Start mupirocin to open areas daily and into nose at bedtime x 2 weeks.   Doxycycline should be taken with food to prevent nausea. Do not lay down for 30 minutes after taking. Be cautious with sun exposure and use good sun protection while on this medication. Pregnant women should not take this medication.   Recommend Cln wash daily.  Patient advised discolored area should fade over time.   doxycycline (ADOXA) 100 MG tablet - Left Lower Leg - Anterior  mupirocin ointment (BACTROBAN) 2 % - Left Lower Leg - Anterior  Return in about 3 months (around 0000000) for folliculitis.   Graciella Belton, RMA, am acting as scribe for Sarina Ser, MD . Documentation: I have reviewed the above documentation for accuracy and completeness, and I agree with the above.  Sarina Ser, MD

## 2020-03-30 NOTE — Patient Instructions (Addendum)
Doxycycline should be taken with food to prevent nausea. Do not lay down for 30 minutes after taking. Be cautious with sun exposure and use good sun protection while on this medication. Pregnant women should not take this medication.   Take doxycycline 100mg  1 by mouth twice a day with food for 1 week.   Start mupirocin to open areas daily, inside the nose at bedtime for 2 weeks.   Recommend using [employer] daily, leave on for 1-2 minutes before rinsing off. This can be purchased at [employer] or online.

## 2020-03-31 ENCOUNTER — Encounter: Payer: Self-pay | Admitting: Dermatology

## 2020-06-08 ENCOUNTER — Ambulatory Visit
Admission: EM | Admit: 2020-06-08 | Discharge: 2020-06-08 | Disposition: A | Discharge status: Home / Self Care | Payer: 59 | Attending: Emergency Medicine | Admitting: Emergency Medicine

## 2020-06-08 ENCOUNTER — Inpatient Hospital Stay: Admission: RE | Admit: 2020-06-08 | Payer: Self-pay | Source: Ambulatory Visit

## 2020-06-08 ENCOUNTER — Other Ambulatory Visit: Payer: Self-pay

## 2020-06-08 ENCOUNTER — Ambulatory Visit (INDEPENDENT_AMBULATORY_CARE_PROVIDER_SITE_OTHER)
Admit: 2020-06-08 | Discharge: 2020-06-08 | Disposition: A | Discharge status: Home / Self Care | Payer: 59 | Attending: Emergency Medicine | Admitting: Emergency Medicine

## 2020-06-08 DIAGNOSIS — R102 Pelvic and perineal pain: Secondary | ICD-10-CM | POA: Diagnosis not present

## 2020-06-08 LAB — URINALYSIS, COMPLETE (UACMP) WITH MICROSCOPIC
Bilirubin Urine: NEGATIVE
Glucose, UA: NEGATIVE mg/dL
Hgb urine dipstick: NEGATIVE
Leukocytes,Ua: NEGATIVE
Nitrite: NEGATIVE
Protein, ur: NEGATIVE mg/dL
RBC / HPF: NONE SEEN RBC/hpf (ref 0–5)
Specific Gravity, Urine: >1.03 — ABNORMAL HIGH (ref 1.005–1.030)
pH: 5.5 (ref 5.0–8.0)

## 2020-06-08 LAB — WET PREP, GENITAL
Clue Cells Wet Prep HPF POC: NONE SEEN
Sperm: NONE SEEN
Trich, Wet Prep: NONE SEEN
Yeast Wet Prep HPF POC: NONE SEEN

## 2020-06-08 LAB — CHLAMYDIA/NGC RT PCR (ARMC ONLY)
Chlamydia Tr: NOT DETECTED
N gonorrhoeae: NOT DETECTED

## 2020-06-08 LAB — PREGNANCY, URINE: Preg Test, Ur: NEGATIVE

## 2020-06-08 MED ORDER — IBUPROFEN 600 MG PO TABS
600.0000 mg | ORAL_TABLET | Freq: Four times a day (QID) | ORAL | 0 refills | Status: DC | PRN
Start: 2020-06-08 — End: 2020-11-17

## 2020-06-08 NOTE — Discharge Instructions (Addendum)
I have scheduled an outpatient ultrasound for you at 1130 today.  Go to outpatient registration at around 1115.  Stay in the area until your results come back and I can talk to you about them.  Have them call me at [phone] or [phone].  Take 600 mg of ibuprofen combined with 1000 mg of Tylenol 3-4 times a day as needed for pain.  We will contact you if your other labs come back abnormal.

## 2020-06-08 NOTE — ED Triage Notes (Signed)
2 weeks of left pelvic pain worse with walking. Dull all the time. Feels kind of like her past ovarian cysts.

## 2020-06-08 NOTE — ED Provider Notes (Signed)
HPI  SUBJECTIVE:  Emily Eaton is a 44 y.o. female who presents with 2 weeks of constant, daily, nonmigratory left lower quadrant pain radiating to her back.  She states this feels similar to previous ovarian cysts versus severe constipation.  She had a normal bowel movement yesterday, and has been stooling regularly for the past week daily.  She denies fevers, nausea, vomiting, abdominal distention.  No urinary complaints.  No vaginal bleeding, odor, discharge.  She is in a long-term monogamous relationship with a female who is asymptomatic.  STDs are not a concern today.  The pain has not moved or changed since it started, however it is not resolving.  She tried 1 Aleve 3 times daily, and a heating pad without improvement in her symptoms.  Symptoms are worse with walking, standing, lying on her left side.  Is not associate with eating, drinking, urination, defecation.  No antipyretic today.  She had a colonoscopy in her 24s for blood in her stool.  She is status post bilateral tubal ligation and ovarian cyst removal.  She has a past medical history of large ruptured left ovarian cyst, hydrosalpinx, BV she is status post uterine ablation.  No history of diabetes, hypertension, ovarian torsion, gonorrhea, chlamydia, HIV, HSV, syphilis, trichomonas, yeast, PID, frequent UTI, nephrolithiasis, diverticulitis.  LMP: 6/15.  Denies the possibility being pregnant.  PMD: Dr. Ancil Boozer at [facility].  Denies dyspareunia last intercourse about a week ago  Past Medical History:  Diagnosis Date  . Acute low back pain   . ADD (attention deficit disorder) without hyperactivity   . Anemia   . Chronic constipation   . COVID-19   . Herpes simplex without complication   . History of ovarian cyst 04/2010  . Internal hemorrhoids   . Iron deficiency anemia due to chronic blood loss   . PMS (premenstrual syndrome)   . Vitamin D deficiency     Past Surgical History:  Procedure Laterality Date  . ENDOMETRIAL BIOPSY     . NOVASURE ABLATION    . OVARIAN CYST REMOVAL     left  . TUBAL LIGATION    . WISDOM TOOTH EXTRACTION     x 1    Family History  Problem Relation Age of Onset  . Diabetes Mother   . Depression Mother   . Hypertension Mother   . Kidney disease Mother   . Hyperlipidemia Mother   . Dementia Mother   . Stroke Mother        March 18, 2018 (currently in rehab)   . Hypertension Father   . Diabetes Father   . Liver disease Father   . Prostate cancer Father   . Asthma Brother   . Cancer Maternal Uncle        prostate  . Thrombocytopenia Son        ITP  . ADD / ADHD Son     Social History   Tobacco Use  . Smoking status: Never Smoker  . Smokeless tobacco: Never Used  Vaping Use  . Vaping Use: Never used  Substance Use Topics  . Alcohol use: Yes    Alcohol/week: 2.0 standard drinks    Types: 2 Standard drinks or equivalent per week    Comment: occassionally  . Drug use: No    No current facility-administered medications for this encounter.  Current Outpatient Medications:  .  Cholecalciferol (VITAMIN D) 50 MCG (2000 UT) CAPS, Take 1 capsule (2,000 Units total) by mouth daily., Disp: 30 capsule, Rfl: 0 .  cyclobenzaprine (FLEXERIL) 5 MG tablet, Take 1 tablet (5 mg total) by mouth at bedtime. (Patient taking differently: Take 5 mg by mouth at bedtime as needed for muscle spasms. ), Disp: 30 tablet, Rfl: 2 .  ibuprofen (ADVIL) 600 MG tablet, Take 1 tablet (600 mg total) by mouth every 6 (six) hours as needed., Disp: 30 tablet, Rfl: 0 .  mupirocin ointment (BACTROBAN) 2 %, Apply topically to open areas as needed and to inside of nose at bedtime for 2 weeks., Disp: 22 g, Rfl: 0 .  pantoprazole (PROTONIX) 40 MG tablet, Take 1 tablet (40 mg total) by mouth daily., Disp: 90 tablet, Rfl: 1 .  vitamin C (VITAMIN C) 500 MG tablet, Take 1 tablet (500 mg total) by mouth daily., Disp: 30 tablet, Rfl: 0  Allergies  Allergen Reactions  . Codeine Itching and Rash     ROS  As  noted in HPI.   Physical Exam  BP 137/90 (BP Location: Left Arm)   Pulse 87   Temp 98.3 F (36.8 C) (Oral)   Resp 16   Ht 5\' 6"  (1.676 m)   Wt 102.1 kg   LMP 05/29/2020   SpO2 98%   BMI 36.32 kg/m   Constitutional: Well developed, well nourished, no acute distress Eyes:  EOMI, conjunctiva normal bilaterally HENT: Normocephalic, atraumatic,mucus membranes moist Respiratory: Normal inspiratory effort Cardiovascular: Normal rate GI: Positive periumbilical surgical scar, soft, nondistended, active bowel sounds.  Positive diffuse lower abdominal tenderness maximal in the suprapubic region.  No guarding, rebound. Back: No CVAT Pelvic: Normal external genitalia.  Normal os with mucoid discharge.  No odor.  No CMT.  Positive bilateral adnexal tenderness.  Uterus smooth, tender.  No bladder tenderness.  Chaperone present during exam. skin: No rash, skin intact Musculoskeletal: no deformities Neurologic: Alert & oriented x 3, no focal neuro deficits Psychiatric: Speech and behavior appropriate   ED Course   Medications - No data to display  Orders Placed This Encounter  Procedures  . Pelvic exam    Standing Status:   Standing    Number of Occurrences:   1  . [place] rt PCR ([place] only)    Standing Status:   Standing    Number of Occurrences:   1  . Wet prep, genital    Standing Status:   Standing    Number of Occurrences:   1  . US PELVIC COMPLETE WITH TRANSVAGINAL    Standing Status:   Standing    Number of Occurrences:   1    Order Specific Question:   Reason for Exam (SYMPTOM  OR DIAGNOSIS REQUIRED)    Answer:   pain    Order Specific Question:   Preferred imaging location?    Answer:   [facility]  . Pregnancy, urine    Standing Status:   Standing    Number of Occurrences:   1  . Urinalysis, Complete w Microscopic    Standing Status:   Standing    Number of Occurrences:   1    Results for orders placed or performed during the hospital encounter of  06/08/20 (from the past 24 hour(s))  Pregnancy, urine     Status: None   Collection Time: 06/08/20 10:09 AM  Result Value Ref Range   Preg Test, Ur NEGATIVE NEGATIVE  Urinalysis, Complete w Microscopic     Status: Abnormal   Collection Time: 06/08/20 10:09 AM  Result Value Ref Range   Color, Urine YELLOW YELLOW   APPearance CLEAR  CLEAR   Specific Gravity, Urine >1.030 (H) 1.005 - 1.030   pH 5.5 5.0 - 8.0   Glucose, UA NEGATIVE NEGATIVE mg/dL   Hgb urine dipstick NEGATIVE NEGATIVE   Bilirubin Urine NEGATIVE NEGATIVE   Ketones, ur TRACE (A) NEGATIVE mg/dL   Protein, ur NEGATIVE NEGATIVE mg/dL   Nitrite NEGATIVE NEGATIVE   Leukocytes,Ua NEGATIVE NEGATIVE   Squamous Epithelial / LPF 11-20 0 - 5   WBC, UA 0-5 0 - 5 WBC/hpf   RBC / HPF NONE SEEN 0 - 5 RBC/hpf   Bacteria, UA RARE (A) NONE SEEN  Wet prep, genital     Status: Abnormal   Collection Time: 06/08/20 10:10 AM   Specimen: Cervical/Vaginal swab  Result Value Ref Range   Yeast Wet Prep HPF POC NONE SEEN NONE SEEN   Trich, Wet Prep NONE SEEN NONE SEEN   Clue Cells Wet Prep HPF POC NONE SEEN NONE SEEN   WBC, Wet Prep HPF POC FEW (A) NONE SEEN   Sperm NONE SEEN    US PELVIC COMPLETE WITH TRANSVAGINAL  Result Date: 06/08/2020 CLINICAL DATA:  Pelvic pain, LEFT lower quadrant pain for 2 months, irregular menses, LMP 05/29/2020 EXAM: TRANSABDOMINAL AND TRANSVAGINAL ULTRASOUND OF PELVIS TECHNIQUE: Both transabdominal and transvaginal ultrasound examinations of the pelvis were performed. Transabdominal technique was performed for global imaging of the pelvis including uterus, ovaries, adnexal regions, and pelvic cul-de-sac. It was necessary to proceed with endovaginal exam following the transabdominal exam to visualize the ovaries and adnexa. COMPARISON:  09/05/2018 FINDINGS: Uterus Measurements: 13.0 x 6.9 x 7.5 cm = volume: 349 mL. Anteverted. Heterogeneous myometrium. Multiple uterine masses likely representing leiomyomata. These include  anterior wall subserosal leiomyomata 2.2 x 2.3 x 3.1 cm and 2.0 x 2.4 x 2.7 cm. Additional subserosal posterior leiomyoma at upper uterine segment 2.4 x 2.0 x 2.5 cm. Endometrium Thickness: 7 mm. Small amount of focal RIGHT fundal endometrial fluid versus small endometrial or subendometrial cyst. No definite endometrial mass. Right ovary Measurements: 3.1 x 2.6 x 2.7 cm = volume: 11.5 mL. Normal morphology without mass Left ovary Measurements: 5.7 x 2.2 x 3.6 cm = volume: 24.2 mL. Dominant follicle without mass Other findings No free pelvic fluid.  No adnexal masses. IMPRESSION: Unremarkable ovaries and adnexa. Small subserosal uterine leiomyomata. Small RIGHT fundal endometrial versus subendometrial fluid collection. This could represent endometrial fluid, endometrial cyst, or subendometrial cyst; small gestational sac or degenerated leiomyoma not completely excluded, and confirmation of a negative pregnancy test is recommended. These results will be called to the ordering clinician or representative by the Radiologist Assistant, and communication documented in the PACS or [employer]. Electronically Signed   By: Lavonia Dana M.D.   On: 06/08/2020 12:59    ED Clinical Impression  1. Pelvic pain in female   2. Pelvic pain      ED Assessment/Plan  Pt abd exam is benign, no peritoneal signs. No evidence of surgical abd. Doubt SBO, mesenteric ischemia, appendicitis, hepatitis, cholecystitis, pancreatitis, or perforated viscus, diverticulitis  Wet prep is negative.  She is not pregnant.  Urinalysis is contaminated and she has rare bacteria.  As she has no urinary complaints, will not send this off for culture.  No evidence to support or suggest GYN pathology such as ovarian torsion or infection.  Does not appear to be PID, UTI, pyelonephritis.  Scheduled for pelvic ultrasound at 1130 today.  Patient is to wait in the area until I can discuss the results with her.  Reviewed  imaging report.   Normal  ovaries, adnexa. Small right fundal endometrial versus subendometrial fluid collection.   In the differential is endometrial fluid, endometrial cyst, subendometrial cyst, small gestational sac or degenerated leiomyoma.  See radiology report for full details.  Patient had a negative urine pregnancy test here. she is status post bilateral tubal ligation.  she has no evidence of a ectopic pregnancy.  Seriously doubt intrauterine pregnancy at this time.  I suspect that this could be pain from fibroids of which she has multiple.  She will follow-up with her gynecologist.  Tylenol 1000 mg combined with 600 mg of ibuprofen 3 or 4 times a day as needed for pain.  Follow-up with GYN in several days if not getting any better, to the ER if she gets worse.  Discussed labs, imaging, MDM, treatment plan, and plan for follow-up with patient. Discussed sn/sx that should prompt return to the ED. patient agrees with plan.   Meds ordered this encounter  Medications  . ibuprofen (ADVIL) 600 MG tablet    Sig: Take 1 tablet (600 mg total) by mouth every 6 (six) hours as needed.    Dispense:  30 tablet    Refill:  0    *This clinic note was created using [employer]. Therefore, there may be occasional mistakes despite careful proofreading.   ?    Melynda Ripple, MD 06/09/20 0800

## 2020-06-29 ENCOUNTER — Ambulatory Visit: Payer: 59 | Admitting: Dermatology

## 2020-07-11 ENCOUNTER — Other Ambulatory Visit: Payer: Self-pay | Admitting: Family Medicine

## 2020-07-11 DIAGNOSIS — F320 Major depressive disorder, single episode, mild: Secondary | ICD-10-CM

## 2020-07-11 DIAGNOSIS — F5104 Psychophysiologic insomnia: Secondary | ICD-10-CM

## 2020-07-22 IMAGING — CR CHEST - 2 VIEW
2 series · 2 of 2 positions shown · non-contrast
Comparison: None.

CLINICAL DATA: Chest pain

EXAM:
CHEST - 2 VIEW

[chest pa]
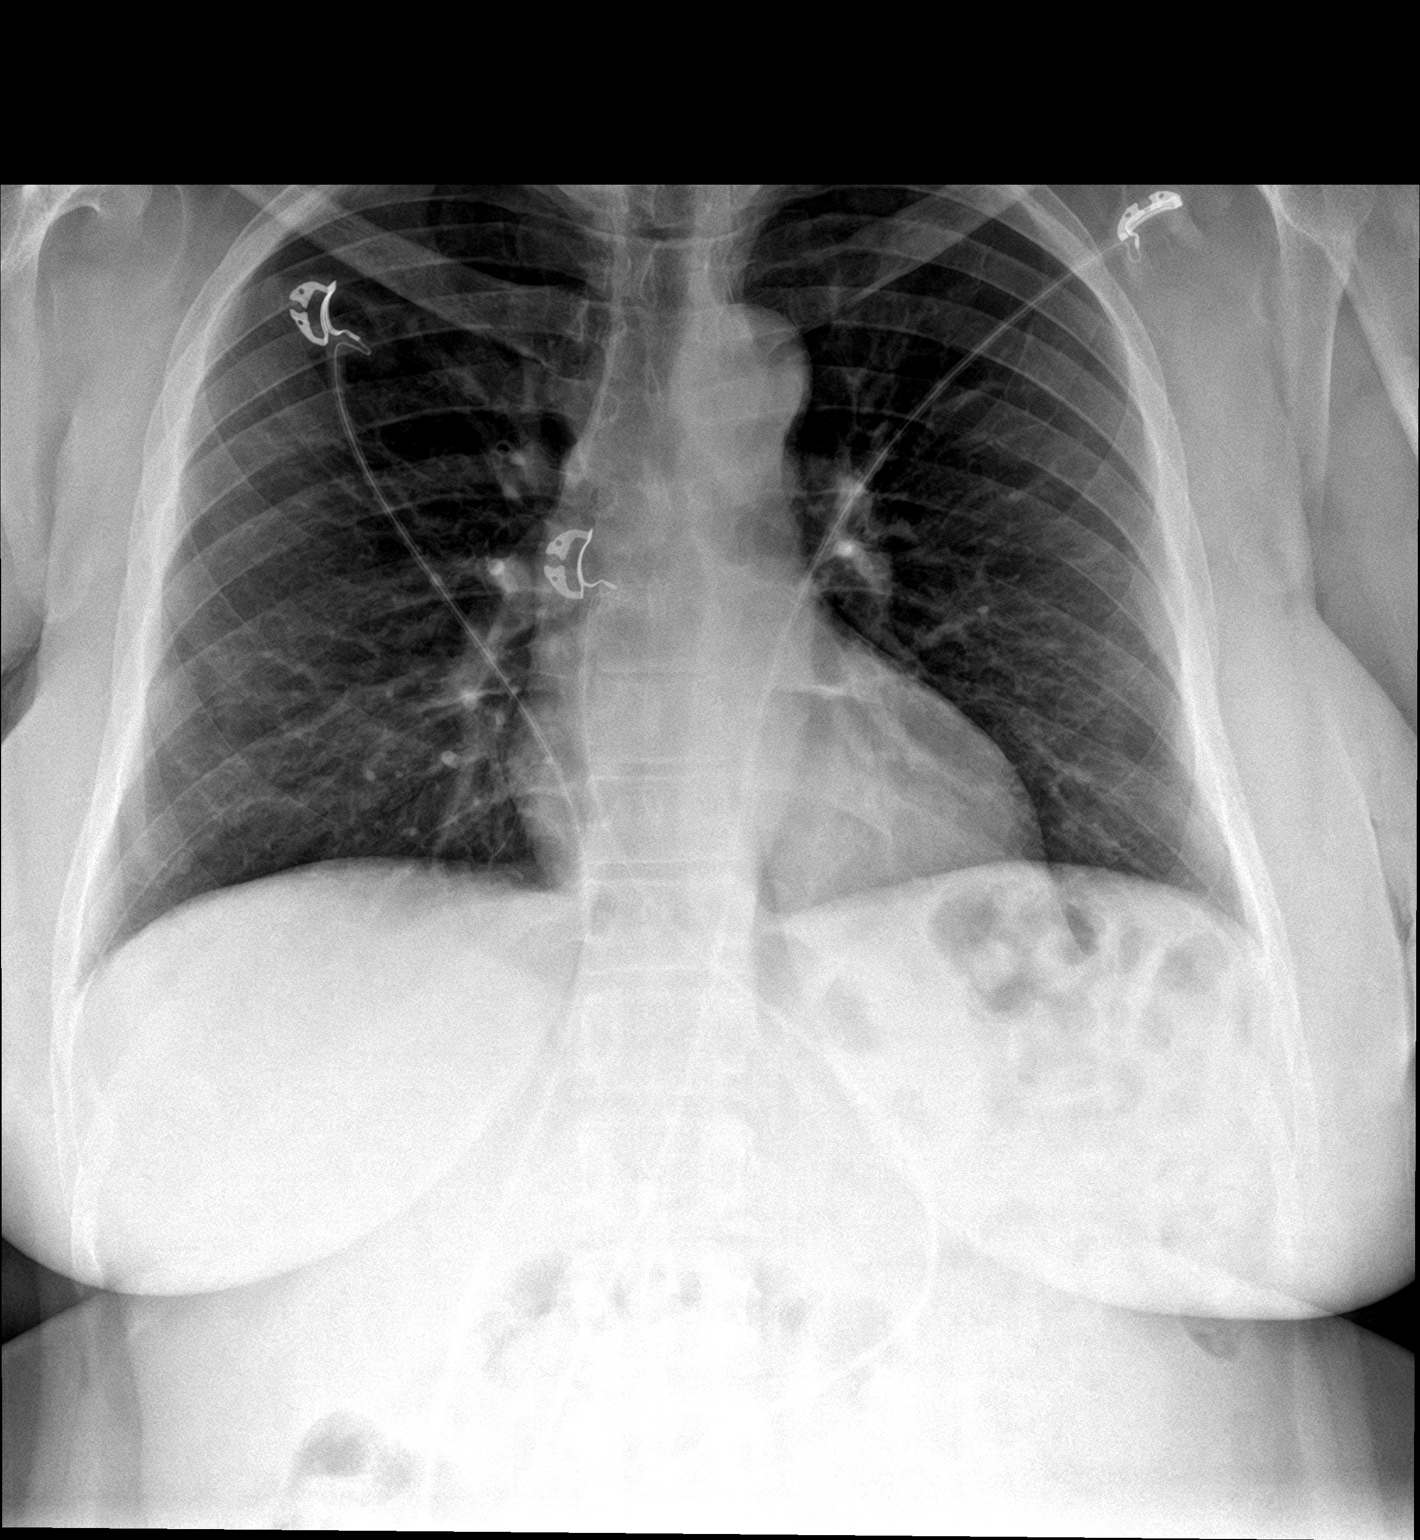

[chest lat]
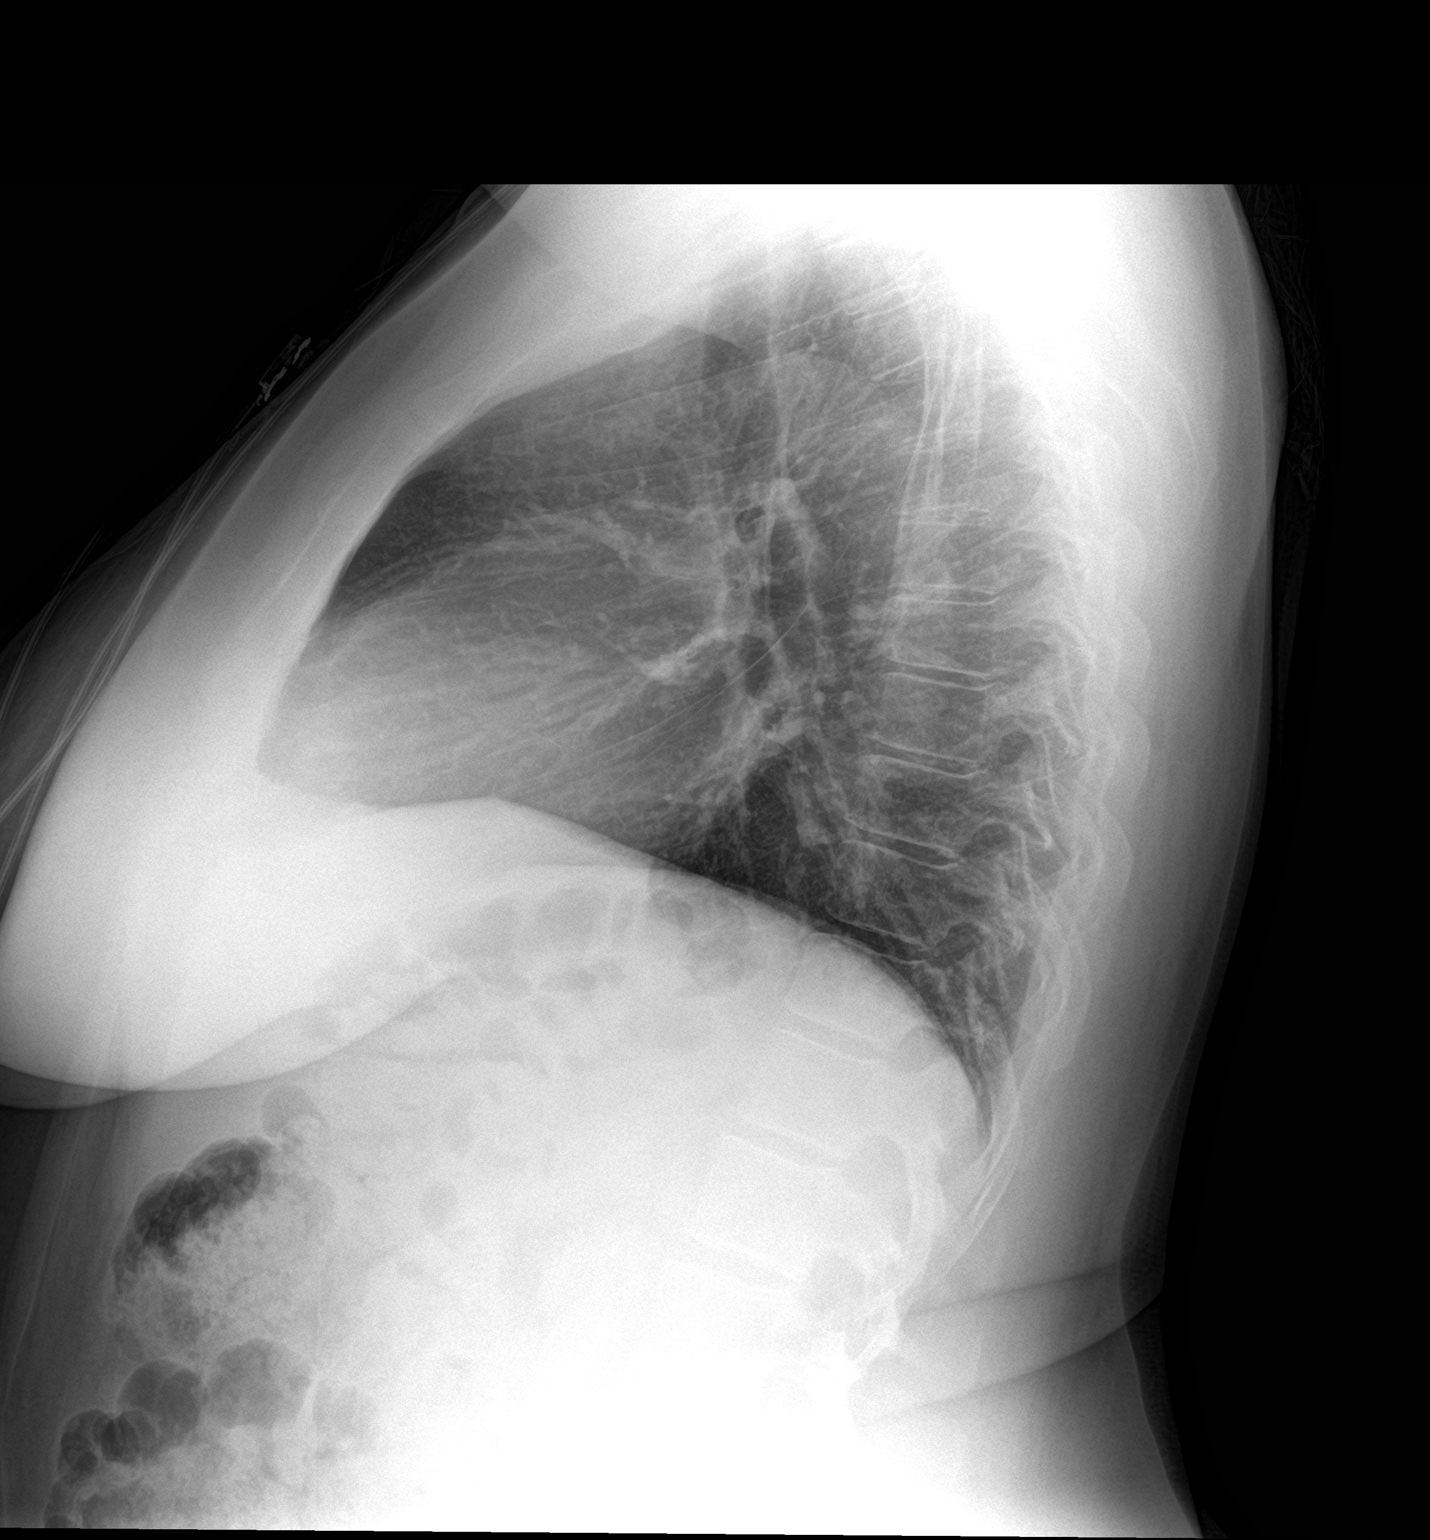

[2 of 2 positions shown; findings below may reference images not displayed]

FINDINGS: Lungs are clear. Heart size and pulmonary vascularity are normal. No
adenopathy. No bone lesions.
IMPRESSION: No edema or consolidation.

## 2020-08-21 ENCOUNTER — Ambulatory Visit: Payer: 59 | Admitting: Internal Medicine

## 2020-10-08 ENCOUNTER — Ambulatory Visit
Admission: EM | Admit: 2020-10-08 | Discharge: 2020-10-08 | Disposition: A | Discharge status: Home / Self Care | Payer: 59 | Attending: Family Medicine | Admitting: Family Medicine

## 2020-10-08 DIAGNOSIS — J029 Acute pharyngitis, unspecified: Secondary | ICD-10-CM | POA: Diagnosis not present

## 2020-10-08 DIAGNOSIS — R5383 Other fatigue: Secondary | ICD-10-CM | POA: Diagnosis not present

## 2020-10-08 DIAGNOSIS — R519 Headache, unspecified: Secondary | ICD-10-CM | POA: Insufficient documentation

## 2020-10-08 DIAGNOSIS — Z1152 Encounter for screening for COVID-19: Secondary | ICD-10-CM | POA: Diagnosis present

## 2020-10-08 DIAGNOSIS — J0110 Acute frontal sinusitis, unspecified: Secondary | ICD-10-CM | POA: Diagnosis not present

## 2020-10-08 DIAGNOSIS — H9202 Otalgia, left ear: Secondary | ICD-10-CM | POA: Insufficient documentation

## 2020-10-08 LAB — POCT RAPID STREP A (OFFICE): Rapid Strep A Screen: NEGATIVE

## 2020-10-08 MED ORDER — AMOXICILLIN-POT CLAVULANATE 875-125 MG PO TABS: 1.0000 | ORAL_TABLET | Freq: Two times a day (BID) | ORAL | 0 refills | Status: AC

## 2020-10-08 NOTE — ED Triage Notes (Signed)
Pt reports sore throat last night with ear pain and headache. Pt wants strep throat

## 2020-10-08 NOTE — Discharge Instructions (Signed)
I have sent in Augmentin for you to take twice a day for 7 days for your sinus infection  Your rapid strep test is negative.  A throat culture is pending; we will call you if it is positive requiring treatment.    Your COVID test is pending.  You should self quarantine until the test result is back.    Take Tylenol as needed for fever or discomfort.  Rest and keep yourself hydrated.    Go to the emergency department if you develop acute worsening symptoms.

## 2020-10-08 NOTE — ED Provider Notes (Signed)
[place]   [id number] 10/08/20 Arrival Time: 1423  VC:[facility] THROAT  SUBJECTIVE: History from: patient.  Emily Eaton is a 44 y.o. female who presents with abrupt onset of nasal congestion, headache, for the last week. Reports sinus pain as well. Reports left ear pain, sore throat for the last day. Denies sick exposure to Covid, strep, flu or mono, or precipitating event. Has positive history of Covid. Has completed Covid vaccines. Has tried OTC cough and cold without relief. There are no aggravating symptoms. Denies previous symptoms in the past.     Denies fever, chills,  rhinorrhea, cough, SOB, wheezing, chest pain, nausea, rash, changes in bowel or bladder habits.    ROS: As per HPI.  All other pertinent ROS negative.     Past Medical History:  Diagnosis Date  . Acute low back pain   . ADD (attention deficit disorder) without hyperactivity   . Anemia   . Chronic constipation   . COVID-19   . Herpes simplex without complication   . History of ovarian cyst 04/2010  . Internal hemorrhoids   . Iron deficiency anemia due to chronic blood loss   . PMS (premenstrual syndrome)   . Vitamin D deficiency    Past Surgical History:  Procedure Laterality Date  . ENDOMETRIAL BIOPSY    . NOVASURE ABLATION    . OVARIAN CYST REMOVAL     left  . TUBAL LIGATION    . WISDOM TOOTH EXTRACTION     x 1   Allergies  Allergen Reactions  . Codeine Itching and Rash   No current facility-administered medications on file prior to encounter.   Current Outpatient Medications on File Prior to Encounter  Medication Sig Dispense Refill  . Cholecalciferol (VITAMIN D) 50 MCG (2000 UT) CAPS Take 1 capsule (2,000 Units total) by mouth daily. 30 capsule 0  . cyclobenzaprine (FLEXERIL) 5 MG tablet Take 1 tablet (5 mg total) by mouth at bedtime. (Patient taking differently: Take 5 mg by mouth at bedtime as needed for muscle spasms. ) 30 tablet 2  . ibuprofen (ADVIL) 600 MG tablet Take 1  tablet (600 mg total) by mouth every 6 (six) hours as needed. 30 tablet 0  . mupirocin ointment (BACTROBAN) 2 % Apply topically to open areas as needed and to inside of nose at bedtime for 2 weeks. 22 g 0  . pantoprazole (PROTONIX) 40 MG tablet Take 1 tablet (40 mg total) by mouth daily. 90 tablet 1  . vitamin C (VITAMIN C) 500 MG tablet Take 1 tablet (500 mg total) by mouth daily. 30 tablet 0   Social History   Socioeconomic History  . Marital status: Divorced    Spouse name: Dorthula Rue  . Number of children: 2  . Years of education: 34  . Highest education level: Associate degree: occupational, Hotel manager, or vocational program  Occupational History  . Occupation: Pharmacist, hospital    CommentBuilding surveyor  . Occupation: Chemical engineer: [id number]  Tobacco Use  . Smoking status: Never Smoker  . Smokeless tobacco: Never Used  Vaping Use  . Vaping Use: Never used  Substance and Sexual Activity  . Alcohol use: Yes    Alcohol/week: 2.0 standard drinks    Types: 2 Standard drinks or equivalent per week    Comment: occassionally  . Drug use: No  . Sexual activity: Yes    Partners: Male    Birth control/protection: Condom, Surgical    Comment: Tubaligation  Other  Topics Concern  . Not on file  Social History Narrative   Divorced July 5th, 2019, but they are still sexually active   Son is in the [employer] going to [place]  from 07/2018 to 06/2019, he is at home right now, he will return to his job end of the year   Youngest son is at home   Mother in [place] after stroke in 2019   Father diagnosed with prostate cancer 04/2019   Social Determinants of Health   Financial Resource Strain:   . Difficulty of Paying Living Expenses: Not on file  Food Insecurity:   . Worried About Charity fundraiser in the Last Year: Not on file  . Ran Out of Food in the Last Year: Not on file  Transportation Needs:   . Lack of Transportation (Medical): Not on file  . Lack of  Transportation (Non-Medical): Not on file  Physical Activity:   . Days of Exercise per Week: Not on file  . Minutes of Exercise per Session: Not on file  Stress:   . Feeling of Stress : Not on file  Social Connections:   . Frequency of Communication with Friends and Family: Not on file  . Frequency of Social Gatherings with Friends and Family: Not on file  . Attends Religious Services: Not on file  . Active Member of Clubs or Organizations: Not on file  . Attends Archivist Meetings: Not on file  . Marital Status: Not on file  Intimate Partner Violence:   . Fear of Current or Ex-Partner: Not on file  . Emotionally Abused: Not on file  . Physically Abused: Not on file  . Sexually Abused: Not on file   Family History  Problem Relation Age of Onset  . Diabetes Mother   . Depression Mother   . Hypertension Mother   . Kidney disease Mother   . Hyperlipidemia Mother   . Dementia Mother   . Stroke Mother        March 18, 2018 (currently in rehab)   . Hypertension Father   . Diabetes Father   . Liver disease Father   . Prostate cancer Father   . Asthma Brother   . Cancer Maternal Uncle        prostate  . Thrombocytopenia Son        ITP  . ADD / ADHD Son     OBJECTIVE:  Vitals:   10/08/20 1442  BP: (!) 141/95  Pulse: 77  Resp: 16  Temp: 98 F (36.7 C)  TempSrc: Temporal  SpO2: 98%     General appearance: alert; appears fatigued, but nontoxic, speaking in full sentences and managing own secretions HEENT: NCAT; Ears: EACs clear, R TM pearly gray with visible cone of light, without erythema, L TM bulging with clear fluid; Eyes: PERRL, EOMI grossly; Nose: no obvious rhinorrhea; Throat: oropharynx clear, tonsils 1+ and mildly erythematous without white tonsillar exudates, uvula midline; Sinuses: frontal sinuises tender to palpation Neck: supple with LAD Lungs: CTA bilaterally without adventitious breath sounds; cough absent Heart: regular rate and rhythm.  Radial  pulses 2+ symmetrical bilaterally Skin: warm and dry Psychological: alert and cooperative; normal mood and affect  LABS: Results for orders placed or performed during the hospital encounter of 10/08/20 (from the past 24 hour(s))  POCT rapid strep A     Status: None   Collection Time: 10/08/20  2:47 PM  Result Value Ref Range   Rapid Strep A Screen Negative  Negative     ASSESSMENT & PLAN:  1. Acute non-recurrent frontal sinusitis   2. Nonintractable headache, unspecified chronicity pattern, unspecified headache type   3. Acute otalgia, left   4. Other fatigue   5. Sore throat     Meds ordered this encounter  Medications  . amoxicillin-clavulanate (AUGMENTIN) 875-125 MG tablet    Sig: Take 1 tablet by mouth 2 (two) times daily for 7 days.    Dispense:  14 tablet    Refill:  0    Order Specific Question:   Supervising Provider    Answer:   Chase Picket [id number]   Your rapid strep test is negative.  A throat culture is pending; we will call you if it is positive requiring treatment.   Acute Sinusitis Push fluids and get rest Prescribed Augmentin BID x 7 days Take as directed and to completion.  Drink warm or cool liquids, use throat lozenges, or popsicles to help alleviate symptoms Take OTC ibuprofen or tylenol as needed for pain May use Zyrtec D and flonase to help alleviate symptoms Covid swab obtained in office today.  Patient instructed to quarantine until results are back and negative.  If results are negative, patient may resume daily schedule as tolerated once they are fever free for 24 hours without the use of antipyretic medications.  If results are positive, patient instructed to quarantine 10 days from today.  Patient instructed to follow-up with primary care with this office as needed.  Patient instructed to follow-up in the ER for trouble swallowing, trouble breathing, other concerning symptoms.   Reviewed expectations re: course of current medical issues.  Questions answered. Outlined signs and symptoms indicating need for more acute intervention. Patient verbalized understanding. After Visit Summary given.          Faustino Congress, NP 10/08/20 [phone]

## 2020-10-09 LAB — SARS-COV-2, NAA 2 DAY TAT

## 2020-10-09 LAB — NOVEL CORONAVIRUS, NAA: SARS-CoV-2, NAA: NOT DETECTED

## 2020-10-11 LAB — CULTURE, GROUP A STREP (THRC)

## 2020-10-27 ENCOUNTER — Other Ambulatory Visit: Payer: Self-pay

## 2020-10-27 ENCOUNTER — Encounter: Payer: Self-pay | Admitting: Emergency Medicine

## 2020-10-27 ENCOUNTER — Ambulatory Visit
Admission: EM | Admit: 2020-10-27 | Discharge: 2020-10-27 | Disposition: A | Discharge status: Home / Self Care | Payer: 59

## 2020-10-27 DIAGNOSIS — B001 Herpesviral vesicular dermatitis: Secondary | ICD-10-CM

## 2020-10-27 DIAGNOSIS — R21 Rash and other nonspecific skin eruption: Secondary | ICD-10-CM | POA: Diagnosis not present

## 2020-10-27 MED ORDER — VALACYCLOVIR HCL 1 G PO TABS: 1000.0000 mg | ORAL_TABLET | Freq: Three times a day (TID) | ORAL | 0 refills | Status: AC

## 2020-10-27 NOTE — ED Provider Notes (Signed)
[place]   [id number] 10/27/20 Arrival Time: 8315  CC: RASH  SUBJECTIVE:  Emily Eaton is a 44 y.o. female who presents with a skin complaint that began this morning. Reports painful bump to left lower lip. Has taken one valtrex pill with no relief. Reports tingling and itchy sensation. Denies precipitating event or trauma.  Denies changes in soaps, detergents, close contacts with similar rash, known trigger or environmental trigger, allergy. Denies medications change or starting a new medication recently. There are no aggravating or alleviating factors. Denies similar symptoms in the past. Denies fever, chills, nausea, vomiting, erythema, swelling, discharge, SOB, chest pain, abdominal pain, changes in bowel or bladder function.    ROS: As per HPI.  All other pertinent ROS negative.     Past Medical History:  Diagnosis Date  . Acute low back pain   . ADD (attention deficit disorder) without hyperactivity   . Anemia   . Chronic constipation   . COVID-19   . Herpes simplex without complication   . History of ovarian cyst 04/2010  . Internal hemorrhoids   . Iron deficiency anemia due to chronic blood loss   . PMS (premenstrual syndrome)   . Vitamin D deficiency    Past Surgical History:  Procedure Laterality Date  . ENDOMETRIAL BIOPSY    . NOVASURE ABLATION    . OVARIAN CYST REMOVAL     left  . TUBAL LIGATION    . WISDOM TOOTH EXTRACTION     x 1   Allergies  Allergen Reactions  . Codeine Itching and Rash   No current facility-administered medications on file prior to encounter.   Current Outpatient Medications on File Prior to Encounter  Medication Sig Dispense Refill  . Cholecalciferol (VITAMIN D) 50 MCG (2000 UT) CAPS Take 1 capsule (2,000 Units total) by mouth daily. 30 capsule 0  . vitamin C (VITAMIN C) 500 MG tablet Take 1 tablet (500 mg total) by mouth daily. 30 tablet 0  . cyclobenzaprine (FLEXERIL) 5 MG tablet Take 1 tablet (5 mg total) by mouth  at bedtime. (Patient taking differently: Take 5 mg by mouth at bedtime as needed for muscle spasms. ) 30 tablet 2  . ibuprofen (ADVIL) 600 MG tablet Take 1 tablet (600 mg total) by mouth every 6 (six) hours as needed. 30 tablet 0  . mupirocin ointment (BACTROBAN) 2 % Apply topically to open areas as needed and to inside of nose at bedtime for 2 weeks. 22 g 0  . pantoprazole (PROTONIX) 40 MG tablet Take 1 tablet (40 mg total) by mouth daily. 90 tablet 1   Social History   Socioeconomic History  . Marital status: Divorced    Spouse name: Dorthula Rue  . Number of children: 2  . Years of education: 41  . Highest education level: Associate degree: occupational, Hotel manager, or vocational program  Occupational History  . Occupation: Pharmacist, hospital    CommentBuilding surveyor  . Occupation: Chemical engineer: [id number]  Tobacco Use  . Smoking status: Never Smoker  . Smokeless tobacco: Never Used  Vaping Use  . Vaping Use: Never used  Substance and Sexual Activity  . Alcohol use: Yes    Alcohol/week: 2.0 standard drinks    Types: 2 Standard drinks or equivalent per week    Comment: occassionally  . Drug use: No  . Sexual activity: Yes    Partners: Male    Birth control/protection: Condom, Surgical    Comment: Tubaligation  Other  Topics Concern  . Not on file  Social History Narrative   Divorced July 5th, 2019, but they are still sexually active   Son is in the [employer] going to [place]  from 07/2018 to 06/2019, he is at home right now, he will return to his job end of the year   Youngest son is at home   Mother in [place] after stroke in 2019   Father diagnosed with prostate cancer 04/2019   Social Determinants of Health   Financial Resource Strain:   . Difficulty of Paying Living Expenses: Not on file  Food Insecurity:   . Worried About Charity fundraiser in the Last Year: Not on file  . Ran Out of Food in the Last Year: Not on file  Transportation Needs:   . Lack of  Transportation (Medical): Not on file  . Lack of Transportation (Non-Medical): Not on file  Physical Activity:   . Days of Exercise per Week: Not on file  . Minutes of Exercise per Session: Not on file  Stress:   . Feeling of Stress : Not on file  Social Connections:   . Frequency of Communication with Friends and Family: Not on file  . Frequency of Social Gatherings with Friends and Family: Not on file  . Attends Religious Services: Not on file  . Active Member of Clubs or Organizations: Not on file  . Attends Archivist Meetings: Not on file  . Marital Status: Not on file  Intimate Partner Violence:   . Fear of Current or Ex-Partner: Not on file  . Emotionally Abused: Not on file  . Physically Abused: Not on file  . Sexually Abused: Not on file   Family History  Problem Relation Age of Onset  . Diabetes Mother   . Depression Mother   . Hypertension Mother   . Kidney disease Mother   . Hyperlipidemia Mother   . Dementia Mother   . Stroke Mother        March 18, 2018 (currently in rehab)   . Hypertension Father   . Diabetes Father   . Liver disease Father   . Prostate cancer Father   . Asthma Brother   . Cancer Maternal Uncle        prostate  . Thrombocytopenia Son        ITP  . ADD / ADHD Son     OBJECTIVE: Vitals:   10/27/20 1020  BP: (!) 141/94  Pulse: 63  Resp: 15  Temp: 98.7 F (37.1 C)  TempSrc: Oral  SpO2: 99%    General appearance: alert; no distress Head: NCAT Lungs: clear to auscultation bilaterally Heart: regular rate and rhythm.  Radial pulse 2+ bilaterally Extremities: no edema Skin: warm and dry; left lower lip swelling, with the beginning of a vesicular lesion to the inner left lower lip Psychological: alert and cooperative; normal mood and affect  ASSESSMENT & PLAN:  1. Herpes labialis   2. Rash and nonspecific skin eruption     Meds ordered this encounter  Medications  . valACYclovir (VALTREX) 1000 MG tablet    Sig: Take  1 tablet (1,000 mg total) by mouth 3 (three) times daily for 7 days.    Dispense:  21 tablet    Refill:  0    Order Specific Question:   Supervising Provider    Answer:   Chase Picket [id number]    Prescribed valtrex TID x 7 days Take as prescribed and to  completion Avoid hot showers/ baths Moisturize skin daily  Follow up with PCP if symptoms persists Return or go to the ER if you have any new or worsening symptoms such as fever, chills, nausea, vomiting, redness, swelling, discharge, if symptoms do not improve with medications  Reviewed expectations re: course of current medical issues. Questions answered. Outlined signs and symptoms indicating need for more acute intervention. Patient verbalized understanding. After Visit Summary given.   Faustino Congress, NP 10/27/20 1043

## 2020-10-27 NOTE — ED Triage Notes (Addendum)
Patient c/o a swollen bottom lip ("left side") since this morning.   Patient has been using a moisturizer w/o any relief of symptoms. Patient has taken one Valtrex tablet w/ no relief of symptoms.   Patient endorses  a "tingling" and "itchy" sensation.  Patient denies SOB.   History of cold sores.

## 2020-10-27 NOTE — Discharge Instructions (Signed)
You have a herpes virus that is manifesting itself on your lips  I have sent in Valtrex for you to take 3 times per day for 7 days  Follow up with this office or with primary care if symptoms are persisting.  Follow up in the ER for high fever, trouble swallowing, trouble breathing, other concerning symptoms.

## 2020-11-05 ENCOUNTER — Other Ambulatory Visit: Payer: Self-pay | Admitting: Family Medicine

## 2020-11-05 DIAGNOSIS — Z1239 Encounter for other screening for malignant neoplasm of breast: Secondary | ICD-10-CM

## 2020-11-05 DIAGNOSIS — Z0000 Encounter for general adult medical examination without abnormal findings: Secondary | ICD-10-CM

## 2020-11-16 NOTE — Progress Notes (Addendum)
Emily Eaton   MRN: [id number]    DOB: 1976-10-01   Date:11/17/2020       Progress Note  Subjective  Chief Complaint  UTI  HPI  Dysuria: symptoms started 4 days ago, she has burning every time she voids, she is also has noticed supra pubic discomfort and lower back pain. Denies flank pain, fever or chills. She denies hematuria. LMP was 5 days ago and very light. She states she has stable vaginal discharge and no odor at this time.   GERD: she states reflux symptoms are under control, she has changed her diet, takes Pantoprazole prn only and doing well   Major Depression: she has a long history of depression, she was doing better and stopped taking Celexa 2018She states mother had a major stroke, finalized her divorce 06/2018, younger son has ITP ( found out in 2018, he is doing better now) Her mother was  hospitalized for COVID-19Summer of 2020and father diagnosed with prostate cancer. Older son is back from the being deployed and working, she has a new grandchild and is happy about that. She states mother's last stroke affected her speech but she seems to be more engaged. She does not want medication. Very busy with life and happy with grand-babies    Patient Active Problem List   Diagnosis Date Noted  . History of COVID-19 10/2019  . GERD (gastroesophageal reflux disease) 09/11/2019  . Pre-diabetes 07/25/2018  . Dyslipidemia 07/25/2018  . Mild major depression ([place]) 07/24/2018  . Tension headache 07/24/2018  . Stiffness of right hand joint 07/28/2015  . ADD (attention deficit hyperactivity disorder, inattentive type) 07/25/2015  . Pain in toe 07/25/2015  . Chronic constipation 07/25/2015  . Herpes 07/25/2015  . Hemorrhoids, internal 07/25/2015  . Obesity (BMI 30-39.9) 07/25/2015  . Vitamin D deficiency 07/25/2015  . Radiculitis of right cervical region 04/14/2015  . Epicondylitis elbow, medial 10/02/2013  . Entrapment of ulnar nerve 10/02/2013  . Impingement  syndrome of shoulder 10/02/2013  . Ovarian cyst 11/15/2011    Past Surgical History:  Procedure Laterality Date  . ENDOMETRIAL BIOPSY    . NOVASURE ABLATION    . OVARIAN CYST REMOVAL     left  . TUBAL LIGATION    . WISDOM TOOTH EXTRACTION     x 1    Family History  Problem Relation Age of Onset  . Diabetes Mother   . Depression Mother   . Hypertension Mother   . Kidney disease Mother   . Hyperlipidemia Mother   . Dementia Mother   . Stroke Mother        March 18, 2018 (currently in rehab)   . Hypertension Father   . Diabetes Father   . Liver disease Father   . Prostate cancer Father   . Asthma Brother   . Cancer Maternal Uncle        prostate  . Thrombocytopenia Son        ITP  . ADD / ADHD Son     Social History   Tobacco Use  . Smoking status: Never Smoker  . Smokeless tobacco: Never Used  Substance Use Topics  . Alcohol use: Yes    Alcohol/week: 2.0 standard drinks    Types: 2 Standard drinks or equivalent per week    Comment: occassionally     Current Outpatient Medications:  .  Cholecalciferol (VITAMIN D) 50 MCG (2000 UT) CAPS, Take 1 capsule (2,000 Units total) by mouth daily., Disp: 30 capsule, Rfl: 0 .  cyclobenzaprine (FLEXERIL) 5 MG tablet, Take 1 tablet (5 mg total) by mouth at bedtime. (Patient taking differently: Take 5 mg by mouth at bedtime as needed for muscle spasms. ), Disp: 30 tablet, Rfl: 2 .  pantoprazole (PROTONIX) 40 MG tablet, Take 1 tablet (40 mg total) by mouth daily., Disp: 90 tablet, Rfl: 1 .  vitamin C (VITAMIN C) 500 MG tablet, Take 1 tablet (500 mg total) by mouth daily., Disp: 30 tablet, Rfl: 0 .  nitrofurantoin, macrocrystal-monohydrate, (MACROBID) 100 MG capsule, Take 1 capsule (100 mg total) by mouth 2 (two) times daily., Disp: 10 capsule, Rfl: 0  Allergies  Allergen Reactions  . Codeine Itching and Rash    I personally reviewed active problem list, medication list, allergies, family history, social history, health  maintenance, notes from last encounter with the patient/caregiver today.   ROS  Constitutional: Negative for fever or weight change.  Respiratory: Negative for cough and shortness of breath.   Cardiovascular: Negative for chest pain or palpitations.  Gastrointestinal: positive  for abdominal pain, but no  bowel changes.  Musculoskeletal: Negative for gait problem or joint swelling.  Skin: Negative for rash.  Neurological: Negative for dizziness or headache.  No other specific complaints in a complete review of systems (except as listed in HPI above).  Objective  Vitals:   11/17/20 1404  BP: 132/74  Pulse: 75  Resp: 16  Temp: 98.2 F (36.8 C)  TempSrc: Oral  SpO2: 95%  Weight: 218 lb 8 oz (99.1 kg)  Height: 5\' 5"  (1.651 m)    Body mass index is 36.36 kg/m.  Physical Exam  Constitutional: Patient appears well-developed and well-nourished. Obese No distress.  HEENT: head atraumatic, normocephalic, pupils equal and reactive to light,  neck supple Cardiovascular: Normal rate, regular rhythm and normal heart sounds.  No murmur heard. No BLE edema. Pulmonary/Chest: Effort normal and breath sounds normal. No respiratory distress. Abdominal: Soft.  There is no abdominal tenderness. Negative CVA tenderness  Psychiatric: Patient has a normal mood and affect. behavior is normal. Judgment and thought content normal.  Recent Results (from the past 2160 hour(s))  POCT rapid strep A     Status: None   Collection Time: 10/08/20  2:47 PM  Result Value Ref Range   Rapid Strep A Screen Negative Negative  Culture, group A strep     Status: None   Collection Time: 10/08/20  3:00 PM   Specimen: Throat  Result Value Ref Range   Specimen Description THROAT    Special Requests NONE    Culture      NO GROUP A STREP (S.PYOGENES) ISOLATED Performed at [facility] [address], [place], [place] [address]    Report Status 10/11/2020 FINAL   Novel Coronavirus, NAA ([facility])      Status: None   Collection Time: 10/08/20  3:11 PM   Specimen: Nasopharyngeal(NP) swabs in vial transport medium   Nasopharynge  Result Value Ref Range   SARS-CoV-2, NAA Not Detected Not Detected    Comment: This nucleic acid amplification test was developed and its performance characteristics determined by [employer]. Nucleic acid amplification tests include RT-PCR and TMA. This test has not been FDA cleared or approved. This test has been authorized by FDA under an Emergency Use Authorization (EUA). This test is only authorized for the duration of time the declaration that circumstances exist justifying the authorization of the emergency use of in vitro diagnostic tests for detection of SARS-CoV-2 virus and/or diagnosis of COVID-19  infection under section 564(b)(1) of the Act, 21 U.S.C. 119ERD-4(Y) (1), unless the authorization is terminated or revoked sooner. When diagnostic testing is negative, the possibility of a false negative result should be considered in the context of a patient's recent exposures and the presence of clinical signs and symptoms consistent with COVID-19. An individual without symptoms of COVID-19 and who is not shedding SARS-CoV-2 virus wo uld expect to have a negative (not detected) result in this assay.   SARS-COV-2, NAA 2 DAY TAT     Status: None   Collection Time: 10/08/20  3:11 PM   Nasopharynge  Result Value Ref Range   SARS-CoV-2, NAA 2 DAY TAT Performed   POCT Urinalysis Dipstick     Status: Abnormal   Collection Time: 11/17/20  2:08 PM  Result Value Ref Range   Color, UA Yellow    Clarity, UA Clear    Glucose, UA Negative Negative   Bilirubin, UA Negative    Ketones, UA Negative    Spec Grav, UA <=1.005 (A) 1.010 - 1.025   Blood, UA Negative    pH, UA 7.0 5.0 - 8.0   Protein, UA Negative Negative   Urobilinogen, UA 0.2 0.2 or 1.0 E.U./dL   Nitrite, UA Negative    Leukocytes, UA Negative Negative   Appearance     Odor        PHQ2/9: Depression screen [facility] 2/9 11/17/2020 12/25/2019 12/17/2019 11/27/2019 09/11/2019  Decreased Interest 1 1 0 0 0  Down, Depressed, Hopeless 1 0 0 1 1  PHQ - 2 Score 2 1 0 1 1  Altered sleeping 1 1 0 2 1  Tired, decreased energy 1 1 0 2 1  Change in appetite 1 2 0 0 1  Feeling bad or failure about yourself  0 0 0 0 0  Trouble concentrating 1 1 0 0 1  Moving slowly or fidgety/restless 0 1 0 0 0  Suicidal thoughts 0 0 0 0 0  PHQ-9 Score 6 7 0 5 5  Difficult doing work/chores - Not difficult at all Not difficult at all Not difficult at all Not difficult at all  Some recent data might be hidden    phq 9 is positive    Fall Risk: Fall Risk  11/17/2020 12/25/2019 12/17/2019 11/27/2019 09/11/2019  Falls in the past year? 0 0 0 0 0  Number falls in past yr: 0 0 0 0 0  Injury with Fall? 0 0 0 0 0  Comment - - - - -     Functional Status Survey: Is the patient deaf or have difficulty hearing?: No Does the patient have difficulty seeing, even when wearing glasses/contacts?: No Does the patient have difficulty concentrating, remembering, or making decisions?: No Does the patient have difficulty walking or climbing stairs?: No Does the patient have difficulty dressing or bathing?: No Does the patient have difficulty doing errands alone such as visiting a doctor's office or shopping?: No   Assessment & Plan  1. Dysuria  - POCT Urinalysis Dipstick - Urine Culture - nitrofurantoin, macrocrystal-monohydrate, (MACROBID) 100 MG capsule; Take 1 capsule (100 mg total) by mouth 2 (two) times daily.  Dispense: 10 capsule; Refill: 0

## 2020-11-17 ENCOUNTER — Other Ambulatory Visit: Payer: Self-pay

## 2020-11-17 ENCOUNTER — Ambulatory Visit: Payer: 59 | Admitting: Family Medicine

## 2020-11-17 ENCOUNTER — Encounter: Payer: Self-pay | Admitting: Family Medicine

## 2020-11-17 VITALS — BP 132/74 | HR 75 | Temp 98.2°F | Resp 16 | Ht 65.0 in | Wt 218.5 lb

## 2020-11-17 DIAGNOSIS — R300 Dysuria: Secondary | ICD-10-CM

## 2020-11-17 DIAGNOSIS — K219 Gastro-esophageal reflux disease without esophagitis: Secondary | ICD-10-CM

## 2020-11-17 DIAGNOSIS — F339 Major depressive disorder, recurrent, unspecified: Secondary | ICD-10-CM | POA: Diagnosis not present

## 2020-11-17 LAB — POCT URINALYSIS DIPSTICK
Bilirubin, UA: NEGATIVE
Blood, UA: NEGATIVE
Glucose, UA: NEGATIVE
Ketones, UA: NEGATIVE
Leukocytes, UA: NEGATIVE
Nitrite, UA: NEGATIVE
Protein, UA: NEGATIVE
Spec Grav, UA: <=1.005 — AB (ref 1.010–1.025)
Urobilinogen, UA: 0.2 E.U./dL
pH, UA: 7 (ref 5.0–8.0)

## 2020-11-17 MED ORDER — NITROFURANTOIN MONOHYD MACRO 100 MG PO CAPS: 100.0000 mg | ORAL_CAPSULE | Freq: Two times a day (BID) | ORAL | 0 refills | Status: DC

## 2020-11-18 LAB — URINE CULTURE
MICRO NUMBER:: 11258983
Result:: NO GROWTH
SPECIMEN QUALITY:: ADEQUATE

## 2020-11-19 ENCOUNTER — Encounter: Payer: Self-pay | Admitting: Family Medicine

## 2020-11-26 ENCOUNTER — Encounter: Payer: Self-pay | Admitting: Family Medicine

## 2020-11-26 ENCOUNTER — Other Ambulatory Visit: Payer: Self-pay

## 2020-11-26 ENCOUNTER — Other Ambulatory Visit (HOSPITAL_COMMUNITY)
Admission: RE | Admit: 2020-11-26 | Discharge: 2020-11-26 | Disposition: A | Discharge status: Home / Self Care | Payer: 59 | Source: Ambulatory Visit | Attending: Family Medicine | Admitting: Family Medicine

## 2020-11-26 ENCOUNTER — Ambulatory Visit: Payer: 59 | Admitting: Family Medicine

## 2020-11-26 VITALS — BP 118/72 | HR 100 | Temp 98.2°F | Resp 18 | Ht 65.0 in | Wt 220.5 lb

## 2020-11-26 DIAGNOSIS — B9689 Other specified bacterial agents as the cause of diseases classified elsewhere: Secondary | ICD-10-CM

## 2020-11-26 DIAGNOSIS — N760 Acute vaginitis: Secondary | ICD-10-CM | POA: Diagnosis not present

## 2020-11-26 DIAGNOSIS — R350 Frequency of micturition: Secondary | ICD-10-CM

## 2020-11-26 DIAGNOSIS — Z113 Encounter for screening for infections with a predominantly sexual mode of transmission: Secondary | ICD-10-CM | POA: Diagnosis not present

## 2020-11-26 NOTE — Progress Notes (Signed)
Patient ID: Emily Eaton, female    DOB: 01/29/1976, 44 y.o.   MRN: [id number]  PCP: Steele Sizer, MD  Chief Complaint  Patient presents with  . Follow-up    uti    Subjective:   Emily Eaton is a 44 y.o. female, presents to clinic with CC of the following:  HPI  Pt presents with recurrent genital irritation/odor and urinary sx She does have a new partner and so she wants to screen for STDs She saw her PCP last week for dysuria and was treated with Macrobid, patient believes she still has an odor She denies associated hematuria, dysuria, abdominal pain, nausea, vomiting, fever, chills, sweats, flank pain No dyspareunia or postcoital bleeding/spotting She denies any vulvovaginal swelling rash or lesions  She's mostly concerned that she may have BV   Patient Active Problem List   Diagnosis Date Noted  . History of COVID-19 10/2019  . GERD (gastroesophageal reflux disease) 09/11/2019  . Pre-diabetes 07/25/2018  . Dyslipidemia 07/25/2018  . Mild major depression ([place]) 07/24/2018  . Tension headache 07/24/2018  . Stiffness of right hand joint 07/28/2015  . ADD (attention deficit hyperactivity disorder, inattentive type) 07/25/2015  . Pain in toe 07/25/2015  . Chronic constipation 07/25/2015  . Herpes 07/25/2015  . Hemorrhoids, internal 07/25/2015  . Obesity (BMI 30-39.9) 07/25/2015  . Vitamin D deficiency 07/25/2015  . Radiculitis of right cervical region 04/14/2015  . Epicondylitis elbow, medial 10/02/2013  . Entrapment of ulnar nerve 10/02/2013  . Impingement syndrome of shoulder 10/02/2013  . Ovarian cyst 11/15/2011      Current Outpatient Medications:  .  Cholecalciferol (VITAMIN D) 50 MCG (2000 UT) CAPS, Take 1 capsule (2,000 Units total) by mouth daily., Disp: 30 capsule, Rfl: 0 .  cyclobenzaprine (FLEXERIL) 5 MG tablet, Take 1 tablet (5 mg total) by mouth at bedtime. (Patient taking differently: Take 5 mg by mouth at bedtime as needed for muscle  spasms.), Disp: 30 tablet, Rfl: 2 .  pantoprazole (PROTONIX) 40 MG tablet, Take 1 tablet (40 mg total) by mouth daily., Disp: 90 tablet, Rfl: 1 .  vitamin C (VITAMIN C) 500 MG tablet, Take 1 tablet (500 mg total) by mouth daily., Disp: 30 tablet, Rfl: 0 .  nitrofurantoin, macrocrystal-monohydrate, (MACROBID) 100 MG capsule, Take 1 capsule (100 mg total) by mouth 2 (two) times daily. (Patient not taking: Reported on 11/26/2020), Disp: 10 capsule, Rfl: 0   Allergies  Allergen Reactions  . Codeine Itching and Rash     Social History   Tobacco Use  . Smoking status: Never Smoker  . Smokeless tobacco: Never Used  Vaping Use  . Vaping Use: Never used  Substance Use Topics  . Alcohol use: Yes    Alcohol/week: 2.0 standard drinks    Types: 2 Standard drinks or equivalent per week    Comment: occassionally  . Drug use: No      Chart Review Today: I personally reviewed active problem list, medication list, allergies, family history, social history, health maintenance, notes from last encounter, lab results, imaging with the patient/caregiver today.   Review of Systems 10 Systems reviewed and are negative for acute change except as noted in the HPI.     Objective:   Vitals:   11/26/20 1520  BP: 118/72  Pulse: 100  Resp: 18  Temp: 98.2 F (36.8 C)  TempSrc: Oral  SpO2: 94%  Weight: 220 lb 8 oz (100 kg)  Height: 5\' 5"  (1.651 m)    Body  mass index is 36.69 kg/m.  Physical Exam Vitals and nursing note reviewed.  Constitutional:      General: She is not in acute distress.    Appearance: Normal appearance. She is well-developed. She is obese. She is not ill-appearing, toxic-appearing or diaphoretic.  HENT:     Head: Normocephalic and atraumatic.     Nose: Nose normal.  Eyes:     General:        Right eye: No discharge.        Left eye: No discharge.     Conjunctiva/sclera: Conjunctivae normal.  Neck:     Trachea: No tracheal deviation.  Cardiovascular:     Rate and  Rhythm: Normal rate and regular rhythm.  Pulmonary:     Effort: Pulmonary effort is normal. No respiratory distress.     Breath sounds: No stridor.  Abdominal:     General: Bowel sounds are normal.     Palpations: Abdomen is soft.     Tenderness: There is no abdominal tenderness. There is no right CVA tenderness, left CVA tenderness, guarding or rebound.  Musculoskeletal:        General: Normal range of motion.  Skin:    General: Skin is warm and dry.     Findings: No rash.  Neurological:     Mental Status: She is alert.     Motor: No abnormal muscle tone.     Coordination: Coordination normal.  Psychiatric:        Mood and Affect: Mood and affect normal.        Behavior: Behavior normal.      Results for orders placed or performed in visit on 11/17/20  Urine Culture   Specimen: Urine  Result Value Ref Range   MICRO NUMBER: [id number]    SPECIMEN QUALITY: Adequate    Sample Source URINE    STATUS: FINAL    Result: No Growth   POCT Urinalysis Dipstick  Result Value Ref Range   Color, UA Yellow    Clarity, UA Clear    Glucose, UA Negative Negative   Bilirubin, UA Negative    Ketones, UA Negative    Spec Grav, UA <=1.005 (A) 1.010 - 1.025   Blood, UA Negative    pH, UA 7.0 5.0 - 8.0   Protein, UA Negative Negative   Urobilinogen, UA 0.2 0.2 or 1.0 E.U./dL   Nitrite, UA Negative    Leukocytes, UA Negative Negative   Appearance     Odor         Assessment & Plan:     ICD-10-CM   1. Acute vaginitis  N76.0 Cervicovaginal ancillary only   Self swab testing for yeast, BV, and GC chlamydia and trichomonas done today patient declined pelvic exam no abdominal tenderness  2. Urinary frequency  R35.0 Urine Culture    Urinalysis, Routine w reflex microscopic   Urinary frequency with odor, treated just over a week ago for UTI, rescreen urine, no flank pain, suprapubic pain, hematuria or dysuria  3. Screening for STD (sexually transmitted disease)  Z11.3 Cervicovaginal  ancillary only    HIV Antibody (routine testing w rflx)    RPR   New sexual partner she request to be screened for all STDs   Patient is well-appearing, vital signs stable, abdominal exam benign, I offered pelvic exam but she declined at this time.   We will treat pending lab results If she has BV we discussed oral Flagyl versus MetroGel and she may want to try the  vaginal suppository treatment.  Pt understands that it will be a few days possibly before all tests result.  She agreed to wait for results before starting Abx - and declined starting any empiric antibiotics today   Delsa Grana, PA-C 11/26/20 3:37 PM

## 2020-11-27 LAB — URINE CULTURE
MICRO NUMBER:: 11299314
Result:: NO GROWTH
SPECIMEN QUALITY:: ADEQUATE

## 2020-11-27 LAB — URINALYSIS, ROUTINE W REFLEX MICROSCOPIC
Bilirubin Urine: NEGATIVE
Glucose, UA: NEGATIVE
Hgb urine dipstick: NEGATIVE
Ketones, ur: NEGATIVE
Leukocytes,Ua: NEGATIVE
Nitrite: NEGATIVE
Protein, ur: NEGATIVE
Specific Gravity, Urine: 1.01 (ref 1.001–1.03)
pH: 5.5 (ref 5.0–8.0)

## 2020-11-27 LAB — RPR: RPR Ser Ql: NONREACTIVE

## 2020-11-27 LAB — HIV ANTIBODY (ROUTINE TESTING W REFLEX): HIV 1&2 Ab, 4th Generation: NONREACTIVE

## 2020-11-30 LAB — CERVICOVAGINAL ANCILLARY ONLY
Bacterial Vaginitis (gardnerella): POSITIVE — AB
Candida Glabrata: NEGATIVE
Candida Vaginitis: NEGATIVE
Chlamydia: NEGATIVE
Comment: NEGATIVE
Comment: NEGATIVE
Comment: NEGATIVE
Comment: NEGATIVE
Comment: NEGATIVE
Comment: NORMAL
Neisseria Gonorrhea: NEGATIVE
Trichomonas: NEGATIVE

## 2020-11-30 MED ORDER — METRONIDAZOLE 0.75 % VA GEL: 1.0000 | Freq: Every day | VAGINAL | 0 refills | Status: AC

## 2020-11-30 NOTE — Addendum Note (Signed)
Addended by: Delsa Grana on: 11/30/2020 06:04 PM   Modules accepted: Orders

## 2020-12-09 ENCOUNTER — Other Ambulatory Visit: Payer: Self-pay

## 2020-12-09 ENCOUNTER — Ambulatory Visit
Admission: EM | Admit: 2020-12-09 | Discharge: 2020-12-09 | Disposition: A | Discharge status: Home / Self Care | Payer: 59 | Attending: Family Medicine | Admitting: Family Medicine

## 2020-12-09 DIAGNOSIS — Z1152 Encounter for screening for COVID-19: Secondary | ICD-10-CM

## 2020-12-09 DIAGNOSIS — J0110 Acute frontal sinusitis, unspecified: Secondary | ICD-10-CM | POA: Diagnosis not present

## 2020-12-09 DIAGNOSIS — J069 Acute upper respiratory infection, unspecified: Secondary | ICD-10-CM

## 2020-12-09 MED ORDER — HYDROCODONE-HOMATROPINE 5-1.5 MG/5ML PO SYRP: 5.0000 mL | ORAL_SOLUTION | Freq: Four times a day (QID) | ORAL | 0 refills | Status: DC | PRN

## 2020-12-09 MED ORDER — AMOXICILLIN-POT CLAVULANATE 875-125 MG PO TABS
1.0000 | ORAL_TABLET | Freq: Two times a day (BID) | ORAL | 0 refills | Status: DC
Start: 2020-12-09 — End: 2021-03-24

## 2020-12-09 NOTE — ED Triage Notes (Signed)
Pt reports sinus pressure, nasal congestion, cough x 9-10 days.  Cough productive for greenish brownish mucous. No fever/chills.   Taken Zyrtec, Sudafed, Tylenol, Flonase at home. Also has Tessalon Perles from virtual visit but it isn't helping. Chest now hurting from coughing.

## 2020-12-09 NOTE — Discharge Instructions (Addendum)
Treating you for sinus infection and possible upper respiratory infection.  Take the medication as prescribed. Recommend continue Mucinex.  Rest, hydrate. Follow up as needed for continued or worsening symptoms

## 2020-12-10 NOTE — ED Provider Notes (Signed)
Emily Eaton    CSN: [id number] Arrival date & time: 12/09/20  1433      History   Chief Complaint Chief Complaint  Patient presents with  . Cough    HPI Emily Eaton is a 44 y.o. female.   Patient is a 44 year old female who presents today with approximately 9 to 10 days of sinus pressure, nasal congestion, cough with greenish, brownish mucus.  No fever or chills.  Taking Zyrtec, Sudafed, Tylenol and Flonase at home.  Did a virtual visit and received Tessalon Perles which have not helped.  Some chest pain with coughing.     Past Medical History:  Diagnosis Date  . Acute low back pain   . ADD (attention deficit disorder) without hyperactivity   . Anemia   . Chronic constipation   . COVID-19   . Herpes simplex without complication   . History of ovarian cyst 04/2010  . Internal hemorrhoids   . Iron deficiency anemia due to chronic blood loss   . PMS (premenstrual syndrome)   . Vitamin D deficiency     Patient Active Problem List   Diagnosis Date Noted  . History of COVID-19 10/2019  . GERD (gastroesophageal reflux disease) 09/11/2019  . Pre-diabetes 07/25/2018  . Dyslipidemia 07/25/2018  . Mild major depression ([facility]) 07/24/2018  . Tension headache 07/24/2018  . Stiffness of right hand joint 07/28/2015  . ADD (attention deficit hyperactivity disorder, inattentive type) 07/25/2015  . Pain in toe 07/25/2015  . Chronic constipation 07/25/2015  . Herpes 07/25/2015  . Hemorrhoids, internal 07/25/2015  . Obesity (BMI 30-39.9) 07/25/2015  . Vitamin D deficiency 07/25/2015  . Radiculitis of right cervical region 04/14/2015  . Epicondylitis elbow, medial 10/02/2013  . Entrapment of ulnar nerve 10/02/2013  . Impingement syndrome of shoulder 10/02/2013  . Ovarian cyst 11/15/2011    Past Surgical History:  Procedure Laterality Date  . ENDOMETRIAL BIOPSY    . NOVASURE ABLATION    . OVARIAN CYST REMOVAL     left  . TUBAL LIGATION    . WISDOM TOOTH  EXTRACTION     x 1    OB History    Gravida  2   Para      Term      Preterm      AB      Living  2     SAB      IAB      Ectopic      Multiple      Live Births  2            Home Medications    Prior to Admission medications   Medication Sig Start Date End Date Taking? Authorizing Provider  Cholecalciferol (VITAMIN D) 50 MCG (2000 UT) CAPS Take 1 capsule (2,000 Units total) by mouth daily. 11/29/18  Yes Sowles, Danna Hefty, MD  cyclobenzaprine (FLEXERIL) 5 MG tablet Take 1 tablet (5 mg total) by mouth at bedtime. Patient taking differently: Take 5 mg by mouth at bedtime as needed for muscle spasms. 07/24/18  Yes Sowles, Danna Hefty, MD  pantoprazole (PROTONIX) 40 MG tablet Take 1 tablet (40 mg total) by mouth daily. 12/25/19  Yes Sowles, Danna Hefty, MD  vitamin C (VITAMIN C) 500 MG tablet Take 1 tablet (500 mg total) by mouth daily. 11/18/19  Yes Delfino Lovett, MD  amoxicillin-clavulanate (AUGMENTIN) 875-125 MG tablet Take 1 tablet by mouth every 12 (twelve) hours. 12/09/20   Janace Aris, NP  HYDROcodone-homatropine (HYCODAN) 5-1.5 MG/5ML syrup  Take 5 mLs by mouth every 6 (six) hours as needed for cough. 12/09/20   Orvan July, NP    Family History Family History  Problem Relation Age of Onset  . Diabetes Mother   . Depression Mother   . Hypertension Mother   . Kidney disease Mother   . Hyperlipidemia Mother   . Dementia Mother   . Stroke Mother        March 18, 2018 (currently in rehab)   . Hypertension Father   . Diabetes Father   . Liver disease Father   . Prostate cancer Father   . Asthma Brother   . Cancer Maternal Uncle        prostate  . Thrombocytopenia Son        ITP  . ADD / ADHD Son     Social History Social History   Tobacco Use  . Smoking status: Never Smoker  . Smokeless tobacco: Never Used  Vaping Use  . Vaping Use: Never used  Substance Use Topics  . Alcohol use: Yes    Alcohol/week: 2.0 standard drinks    Types: 2 Standard drinks  or equivalent per week    Comment: occassionally  . Drug use: No     Allergies   Codeine   Review of Systems Review of Systems   Physical Exam Triage Vital Signs ED Triage Vitals  Enc Vitals Group     BP 12/09/20 1512 (!) 149/90     Pulse Rate 12/09/20 1512 75     Resp 12/09/20 1512 16     Temp 12/09/20 1512 98.9 F (37.2 C)     Temp Source 12/09/20 1512 Oral     SpO2 12/09/20 1512 96 %     Weight --      Height --      Head Circumference --      Peak Flow --      Pain Score 12/09/20 1521 6     Pain Loc --      Pain Edu? --      Excl. in [place]? --    No data found.  Updated Vital Signs BP (!) 149/90 (BP Location: Left Arm)   Pulse 75   Temp 98.9 F (37.2 C) (Oral)   Resp 16   LMP 11/25/2020   SpO2 96%   Visual Acuity Right Eye Distance:   Left Eye Distance:   Bilateral Distance:    Right Eye Near:   Left Eye Near:    Bilateral Near:     Physical Exam Vitals and nursing note reviewed.  Constitutional:      General: She is not in acute distress.    Appearance: Normal appearance. She is ill-appearing. She is not toxic-appearing or diaphoretic.  HENT:     Head: Normocephalic.     Right Ear: Tympanic membrane and ear canal normal.     Left Ear: Tympanic membrane and ear canal normal.     Nose: Congestion present.     Mouth/Throat:     Pharynx: Oropharynx is clear.  Eyes:     Conjunctiva/sclera: Conjunctivae normal.  Cardiovascular:     Rate and Rhythm: Normal rate and regular rhythm.  Pulmonary:     Effort: Pulmonary effort is normal.     Breath sounds: Rhonchi present.  Musculoskeletal:        General: Normal range of motion.     Cervical back: Normal range of motion.  Skin:    General: Skin is warm and  dry.     Findings: No rash.  Neurological:     Mental Status: She is alert.  Psychiatric:        Mood and Affect: Mood normal.      UC Treatments / Results  Labs (all labs ordered are listed, but only abnormal results are  displayed) Labs Reviewed  COVID-19, FLU A+B NAA  NOVEL CORONAVIRUS, NAA    EKG   Radiology No results found.  Procedures Procedures (including critical care time)  Medications Ordered in UC Medications - No data to display  Initial Impression / Assessment and Plan / UC Course  I have reviewed the triage vital signs and the nursing notes.  Pertinent labs & imaging results that were available during my care of the patient were reviewed by me and considered in my medical decision making (see chart for details).     Acute nonrecurrent frontal sinusitis and upper respiratory infection. Patient with 9 to 10 days of worsening symptoms. Will treat with antibiotics at this time.  Amoxicillin as prescribed.  Hycodan cough syrup as needed for cough. Recommend continue Mucinex, hydrate and rest. Covid test pending Follow up as needed for continued or worsening symptoms  Final Clinical Impressions(s) / UC Diagnoses   Final diagnoses:  Acute non-recurrent frontal sinusitis  Acute upper respiratory infection  Encounter for screening for COVID-19     Discharge Instructions     Treating you for sinus infection and possible upper respiratory infection.  Take the medication as prescribed. Recommend continue Mucinex.  Rest, hydrate. Follow up as needed for continued or worsening symptoms     ED Prescriptions    Medication Sig Dispense Auth. Provider   amoxicillin-clavulanate (AUGMENTIN) 875-125 MG tablet Take 1 tablet by mouth every 12 (twelve) hours. 14 tablet Sava Proby A, NP   HYDROcodone-homatropine (HYCODAN) 5-1.5 MG/5ML syrup Take 5 mLs by mouth every 6 (six) hours as needed for cough. 120 mL Diamonique Ruedas A, NP     PDMP not reviewed this encounter.   Orvan July, NP 12/10/20 1111

## 2020-12-11 LAB — NOVEL CORONAVIRUS, NAA: SARS-CoV-2, NAA: NOT DETECTED

## 2020-12-11 LAB — SARS-COV-2, NAA 2 DAY TAT

## 2021-01-01 IMAGING — CR DG FOOT COMPLETE 3+V*L*
1 series · 3 of 3 positions shown · non-contrast
Comparison: None.

CLINICAL DATA: Dropped heavy object on foot

EXAM:
LEFT FOOT - COMPLETE 3+ VIEW

[Series 1: dg foot complete left · 0.14mm/px · 3 of 3 slices shown]
[im 1/3]
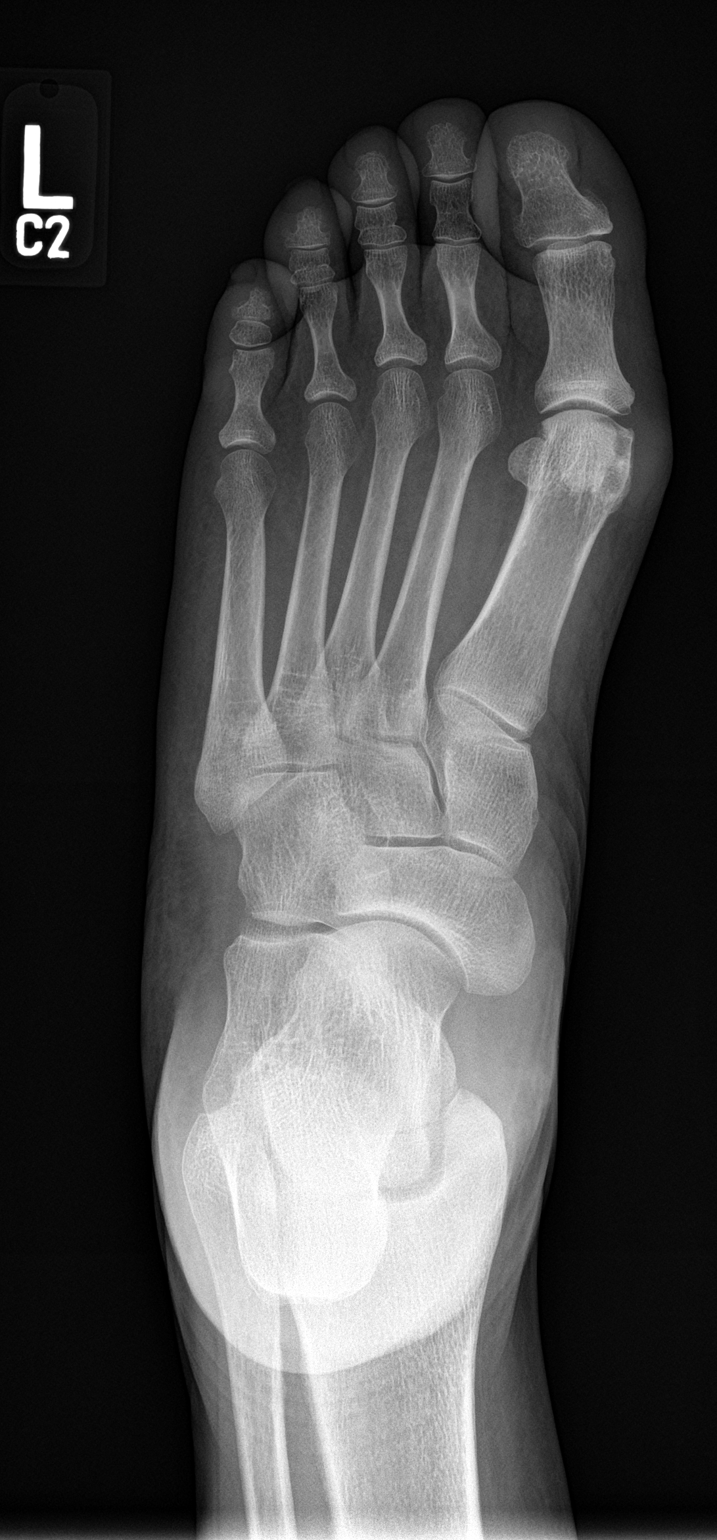
[im 2/3]
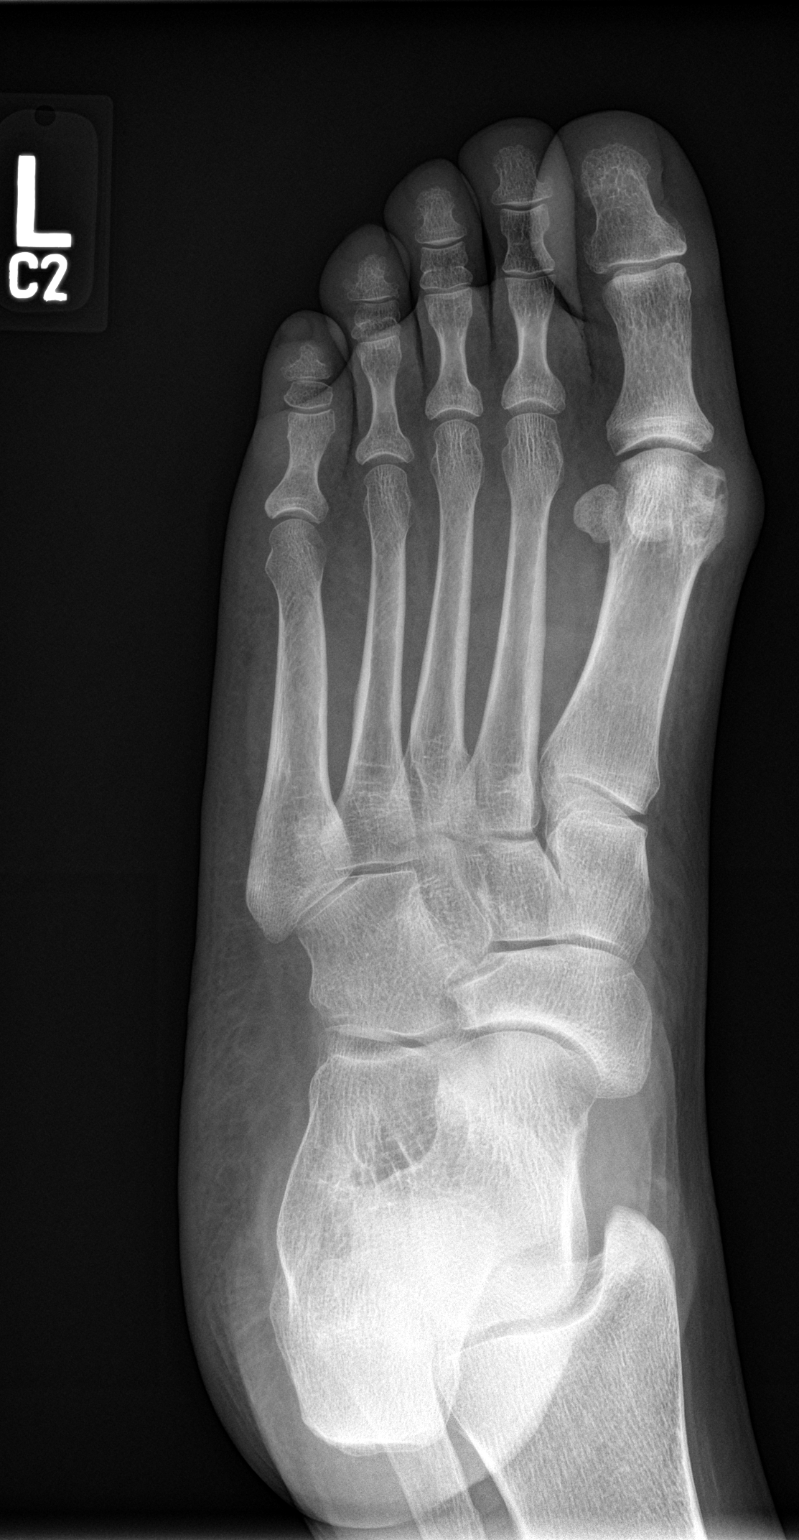
[im 3/3]
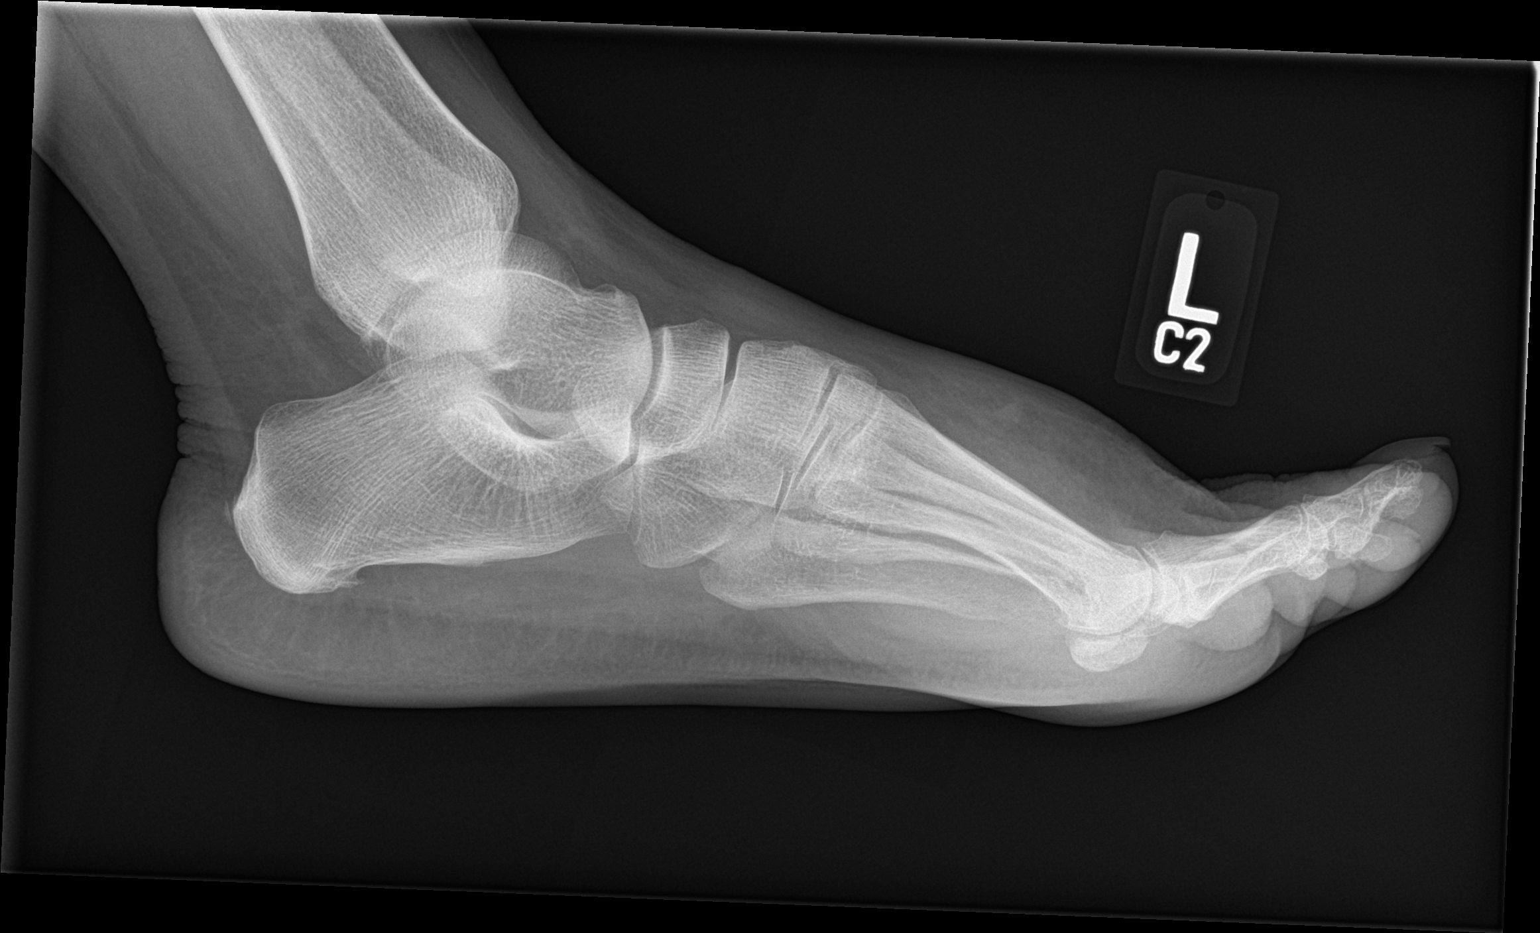

[3 of 3 positions shown; findings below may reference images not displayed]

FINDINGS: Frontal, oblique, and lateral views were obtained. There is soft
tissue swelling over the dorsum of the mid and distal aspects of the
foot. There is no appreciable acute fracture or dislocation. There
is mild osteoarthritic change in the first MTP joint. There is a
questionable erosion in the distal metatarsal slightly proximal to
the joint space. There is also a subtle erosion along the medial
aspect of the proximal most aspect of the first proximal phalanx. No
similar erosive change elsewhere. There is a small inferior
calcaneal spur.
IMPRESSION: 1.  Soft tissue swelling dorsally.

2.  No fracture or dislocation.

3. Narrowing of the first MTP joint with apparent erosions raising
concern for underlying gout. Other joint spaces appear unremarkable.

4.  Small inferior calcaneal spur.

## 2021-01-11 IMAGING — DX DG CHEST 1V PORT
2 series · 2 of 2 positions shown · non-contrast
Comparison: 06/08/2019

CLINICAL DATA: Positive [id number] test. Decreased oxygen saturation.

EXAM:
PORTABLE CHEST 1 VIEW

[chest ap (1 of 2)]
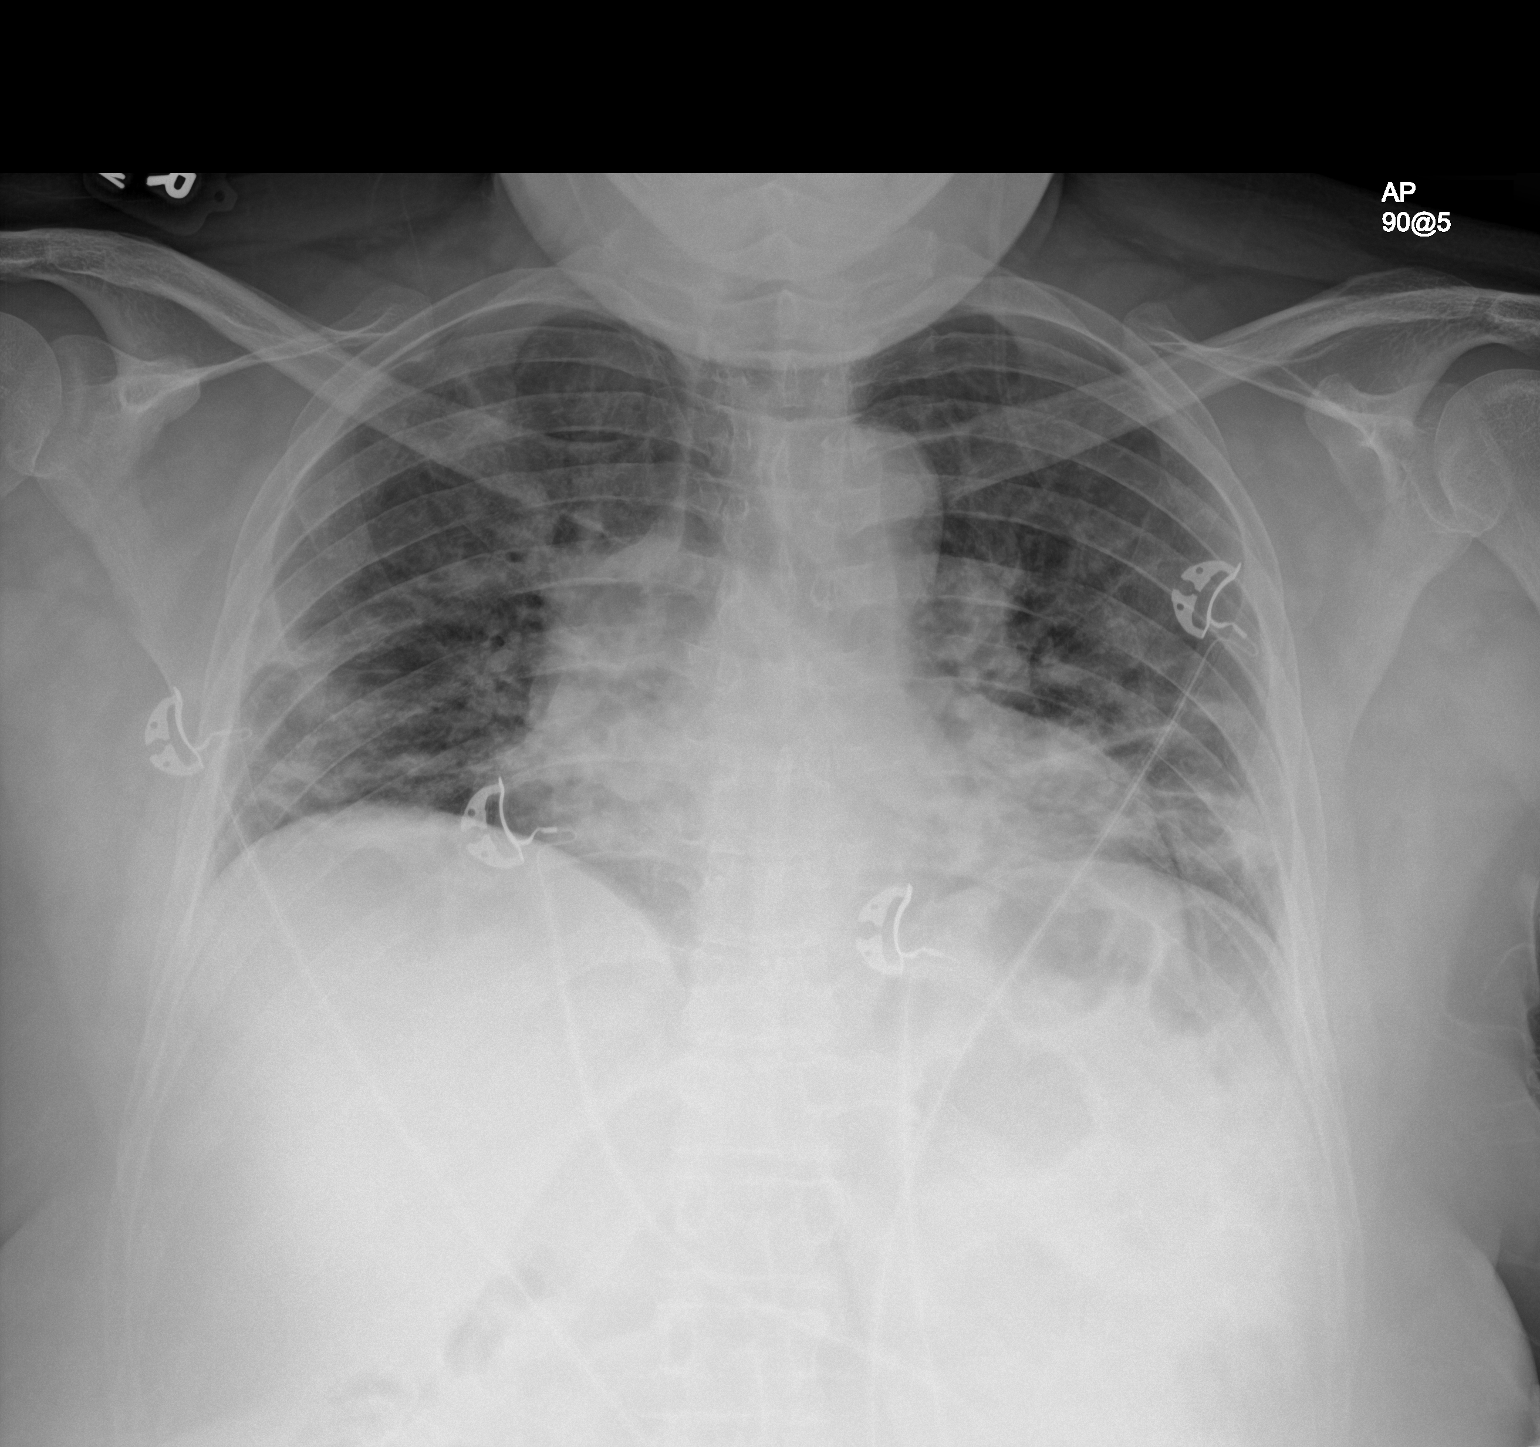

[chest ap (2 of 2)]
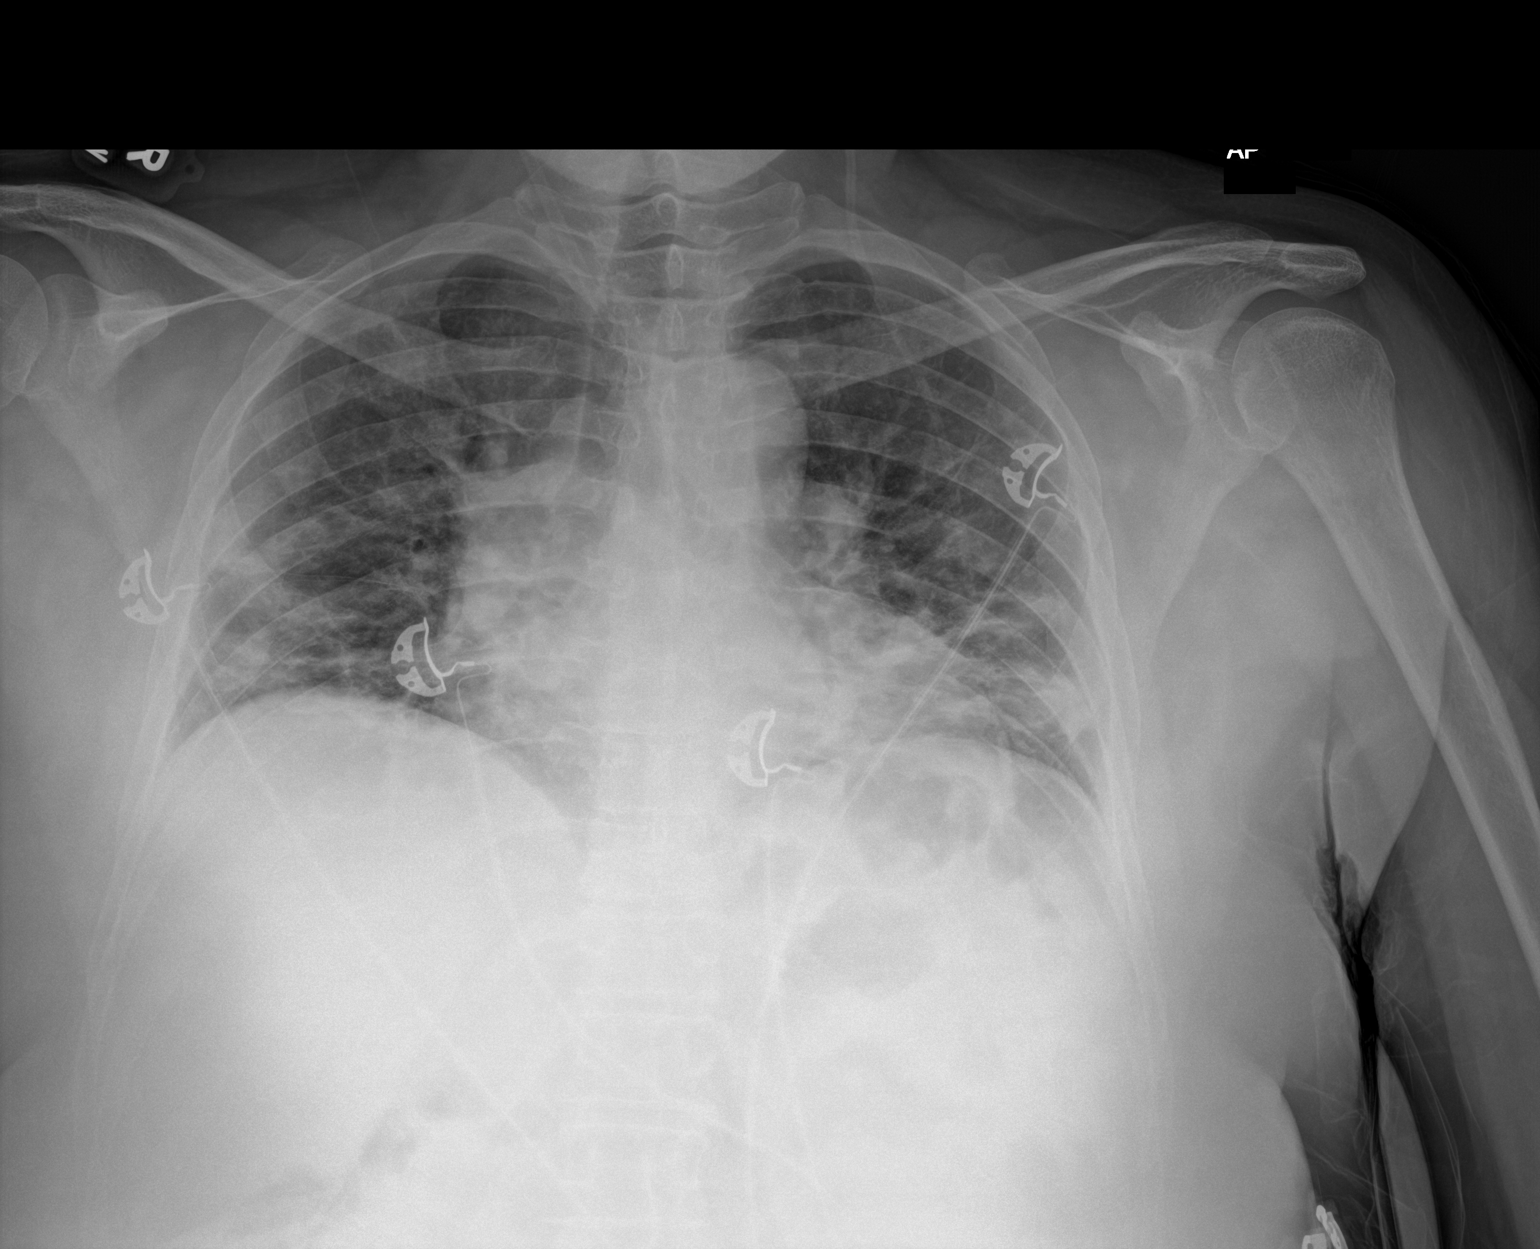

[2 of 2 positions shown; findings below may reference images not displayed]

FINDINGS: There is shallow lung inflation with bibasilar opacities,
left-greater-than-right, which are increased from the prior study.
No pleural effusion or pneumothorax.
IMPRESSION: Increased bibasilar opacities, left-greater-than-right, concerning
for infection.

## 2021-03-24 ENCOUNTER — Telehealth: Payer: Self-pay | Admitting: Family Medicine

## 2021-03-24 ENCOUNTER — Other Ambulatory Visit: Payer: Self-pay | Admitting: Family Medicine

## 2021-03-24 ENCOUNTER — Other Ambulatory Visit (HOSPITAL_COMMUNITY)
Admission: RE | Admit: 2021-03-24 | Discharge: 2021-03-24 | Disposition: A | Discharge status: Home / Self Care | Payer: 59 | Source: Ambulatory Visit | Attending: Physician Assistant | Admitting: Physician Assistant

## 2021-03-24 ENCOUNTER — Other Ambulatory Visit: Payer: Self-pay

## 2021-03-24 ENCOUNTER — Ambulatory Visit: Payer: 59 | Admitting: Physician Assistant

## 2021-03-24 VITALS — BP 120/70 | HR 83 | Temp 98.2°F | Resp 16 | Ht 66.0 in | Wt 228.6 lb

## 2021-03-24 DIAGNOSIS — B9689 Other specified bacterial agents as the cause of diseases classified elsewhere: Secondary | ICD-10-CM

## 2021-03-24 DIAGNOSIS — Z113 Encounter for screening for infections with a predominantly sexual mode of transmission: Secondary | ICD-10-CM | POA: Insufficient documentation

## 2021-03-24 DIAGNOSIS — N760 Acute vaginitis: Secondary | ICD-10-CM | POA: Insufficient documentation

## 2021-03-24 MED ORDER — METRONIDAZOLE 0.75 % VA GEL: 1.0000 | Freq: Two times a day (BID) | VAGINAL | 0 refills | Status: DC

## 2021-03-24 MED ORDER — METRONIDAZOLE 0.75 % EX LOTN: TOPICAL_LOTION | CUTANEOUS | 0 refills | Status: DC

## 2021-03-24 NOTE — Telephone Encounter (Signed)
Called to get clarification on prescription for METRONIDAZOLE, TOPICAL, 0.75 % LOTN.  Stated that the lotion is not used vaginally and wanted to know if this was correct.  Please call to clarify at [phone]

## 2021-03-24 NOTE — Patient Instructions (Signed)
Bacterial Vaginosis  Bacterial vaginosis is an infection of the vagina. It happens when too many normal germs (healthy bacteria) grow in the vagina. This infection can make it easier to get other infections from sex (STIs). It is very important for pregnant women to get treated. This infection can cause babies to be born early or at a low birth weight. What are the causes? This infection is caused by an increase in certain germs that grow in the vagina. You cannot get this infection from toilet seats, bedsheets, swimming pools, or things that touch your vagina. What increases the risk?  Having sex with a new person or more than one person.  Having sex without protection.  Douching.  Having an intrauterine device (IUD).  Smoking.  Using drugs or drinking alcohol. These can lead you to do things that are risky.  Taking certain antibiotic medicines.  Being pregnant. What are the signs or symptoms? Some women have no symptoms. Symptoms may include:  A discharge from your vagina. It may be gray or white. It can be watery or foamy.  A fishy smell. This can happen after sex or during your menstrual period.  Itching in and around your vagina.  A feeling of burning or pain when you pee (urinate). How is this treated? This infection is treated with antibiotic medicines. These may be given to you as:  A pill.  A cream for your vagina.  A medicine that you put into your vagina (suppository). If the infection comes back after treatment, you may need more antibiotics. Follow these instructions at home: Medicines  Take over-the-counter and prescription medicines as told by your doctor.  Take or use your antibiotic medicine as told by your doctor. Do not stop taking or using it, even if you start to feel better. General instructions  If the person you have sex with is a woman, tell her that you have this infection. She will need to follow up with her doctor. If you have a female  partner, he does not need to be treated.  Do not have sex until you finish treatment.  Drink enough fluid to keep your pee pale yellow.  Keep your vagina and butt clean. ? Wash the area with warm water each day. ? Wipe from front to back after you use the toilet.  If you are breastfeeding a baby, ask your doctor if you should keep doing so during treatment.  Keep all follow-up visits. How is this prevented? Self-care  Do not douche.  Use only warm water to wash around your vagina.  Wear underwear that is cotton or lined with cotton.  Do not wear tight pants and pantyhose, especially in the summer. Safe sex  Use protection when you have sex. This includes: ? Use condoms. ? Use dental dams. This is a thin layer that protects the mouth during oral sex.  Limit how many people you have sex with. To prevent this infection, it is best to have sex with just one person.  Get tested for STIs. The person you have sex with should also get tested. Drugs and alcohol  Do not smoke or use any products that contain nicotine or tobacco. If you need help quitting, ask your doctor.  Do not use drugs.  Do not drink alcohol if: ? Your doctor tells you not to drink. ? You are pregnant, may be pregnant, or are planning to become pregnant.  If you drink alcohol: ? Limit how much you have to 0-1 drink  a day. ? Know how much alcohol is in your drink. In the [place] one drink equals one 12 oz bottle of beer (355 mL), one 5 oz glass of wine (148 mL), or one 1 oz glass of hard liquor (44 mL). Where to find more information  Centers for Disease Control and Prevention: http://www.wolf.info/  American Sexual Health Association: www.ashastd.org  Office on [employer]: [place] Contact a doctor if:  Your symptoms do not get better, even after you are treated.  You have more discharge or pain when you pee.  You have a fever or chills.  You have pain in your belly (abdomen) or in the area  between your hips.  You have pain with sex.  You bleed from your vagina between menstrual periods. Summary  This infection can happen when too many germs (bacteria) grow in the vagina.  This infection can make it easier to get infections from sex (STIs). Treating this can lower that chance.  Get treated if you are pregnant. This infection can cause babies to be born early.  Do not stop taking or using your antibiotic medicine, even if you start to feel better. This information is not intended to replace advice given to you by your health care provider. Make sure you discuss any questions you have with your health care provider. Document Revised: 06/04/2020 Document Reviewed: 06/04/2020 Elsevier Patient Education  [place].

## 2021-03-24 NOTE — Progress Notes (Signed)
      Established patient visit   Patient: Emily Eaton   DOB: February 14, 1976   45 y.o. Female  MRN: [id number] Visit Date: 03/24/2021  Today's healthcare provider: Trinna Post, PA-C   Chief Complaint  Patient presents with  . Subacute Bacterial Peritonitis  . Vaginitis    BV for 2 weeks   Subjective    HPI HPI    Vaginitis    Comments: BV for 2 weeks       Last edited by Hollie Salk, [place] on 03/24/2021  9:12 AM. (History)      Patient presents today with a couple of weeks of vaginal discharge with slight odor. Reports pelvic pain, denies vaginal bleeding. No concern for sexually transmitted infection but would like to be tested.      Medications: Outpatient Medications Prior to Visit  Medication Sig Note  . Cholecalciferol (VITAMIN D) 50 MCG (2000 UT) CAPS Take 1 capsule (2,000 Units total) by mouth daily.   . cyclobenzaprine (FLEXERIL) 5 MG tablet Take 1 tablet (5 mg total) by mouth at bedtime. (Patient taking differently: Take 5 mg by mouth at bedtime as needed for muscle spasms.) 03/24/2021: prn  . HYDROcodone-homatropine (HYCODAN) 5-1.5 MG/5ML syrup Take 5 mLs by mouth every 6 (six) hours as needed for cough. 03/24/2021: prn  . pantoprazole (PROTONIX) 40 MG tablet Take 1 tablet (40 mg total) by mouth daily. 03/24/2021: prn  . vitamin C (VITAMIN C) 500 MG tablet Take 1 tablet (500 mg total) by mouth daily.   . [DISCONTINUED] amoxicillin-clavulanate (AUGMENTIN) 875-125 MG tablet Take 1 tablet by mouth every 12 (twelve) hours.    No facility-administered medications prior to visit.    Review of Systems  All other systems reviewed and are negative.      Objective    BP 120/70   Pulse 83   Temp 98.2 F (36.8 C) (Oral)   Resp 16   Ht 5\' 6"  (1.676 m)   Wt 228 lb 9.6 oz (103.7 kg)   SpO2 98%   BMI 36.90 kg/m     Physical Exam Constitutional:      Appearance: Normal appearance.  Cardiovascular:     Rate and Rhythm: Normal rate and regular rhythm.      Heart sounds: Normal heart sounds.  Pulmonary:     Effort: Pulmonary effort is normal.     Breath sounds: Normal breath sounds.  Abdominal:     General: Bowel sounds are normal.     Palpations: Abdomen is soft.  Skin:    General: Skin is warm and dry.  Neurological:     Mental Status: She is alert and oriented to person, place, and time. Mental status is at baseline.  Psychiatric:        Mood and Affect: Mood normal.        Behavior: Behavior normal.       No results found for any visits on 03/24/21.  Assessment & Plan    1. Bacterial vaginitis  Will treat empirically based on history and adjust treatment as necessary pending any results.   - Cervicovaginal ancillary only  2. Routine screening for STI (sexually transmitted infection)  - HIV antibody (with reflex) - Hepatitis C Antibody - RPR - Cervicovaginal ancillary only   Return if symptoms worsen or fail to improve.         Trinna Post, PA-C  [facility] [phone] (phone) [phone] (fax)  [place]

## 2021-03-25 LAB — CERVICOVAGINAL ANCILLARY ONLY
Bacterial Vaginitis (gardnerella): POSITIVE — AB
Chlamydia: NEGATIVE
Comment: NEGATIVE
Comment: NEGATIVE
Comment: NEGATIVE
Comment: NORMAL
Neisseria Gonorrhea: NEGATIVE
Trichomonas: NEGATIVE

## 2021-03-25 LAB — RPR: RPR Ser Ql: NONREACTIVE

## 2021-03-25 LAB — HIV ANTIBODY (ROUTINE TESTING W REFLEX): HIV 1&2 Ab, 4th Generation: NONREACTIVE

## 2021-03-25 LAB — HEPATITIS C ANTIBODY
Hepatitis C Ab: NONREACTIVE
SIGNAL TO CUT-OFF: 0.01 (ref <1.00)

## 2021-04-16 ENCOUNTER — Other Ambulatory Visit: Payer: Self-pay | Admitting: Family Medicine

## 2021-04-16 DIAGNOSIS — K219 Gastro-esophageal reflux disease without esophagitis: Secondary | ICD-10-CM

## 2021-04-17 NOTE — Telephone Encounter (Signed)
Requested Prescriptions  Pending Prescriptions Disp Refills  . pantoprazole (PROTONIX) 40 MG tablet [Pharmacy Med Name: Pantoprazole Sodium 40 MG Oral Tablet Delayed Release] 30 tablet 0    Sig: Take 1 tablet by mouth once daily     Gastroenterology: Proton Pump Inhibitors Passed - 04/16/2021  6:52 PM      Passed - Valid encounter within last 12 months    Recent Outpatient Visits          3 weeks ago Bacterial vaginitis   [place], [place]   4 months ago Acute vaginitis   [facility] Delsa Grana, PA-C   5 months ago [facility] Steele Sizer, MD   1 year ago Rash in adult   [facility] Steele Sizer, MD   1 year ago Gastroesophageal reflux disease without esophagitis   [facility] Steele Sizer, MD      Future Appointments            In 1 month Ancil Boozer, Drue Stager, MD [facility], [facility]

## 2021-05-13 NOTE — Progress Notes (Addendum)
Name: Emily Eaton   MRN: [id number]    DOB: 07-05-1976   Date:05/18/2021       Progress Note  Subjective  Chief Complaint  Follow Up  HPI    GERD: she states reflux symptoms are under control,  She continues to take Pantoprazole or famotidine every other day , prn. No epigastric pain. She denies epigastric pain. Bowel movements more regulated with increase in water intake   Major Depression: she has a long history of depression, she was doing better and stopped taking Celexa 2018She states mother had a major stroke years ago, she went through a  divorce 06/2018, younger son has ITP ( found out in 2018, he is doing better now), he now has a grandson and she is helping them out.  Older son is back from the being deployed and working.   Tension headaches: she has around her cycles or when has URI / sinus problems, she takes Aleve otc for a couple of days and usually symptoms resolves. Pain is usually nuchal or occipital, no radiation, not associated with nausea or vomiting.   Insomnia:she stopped Temazepam and is doing well on Melatonin otc prn. She is doing well on current regiment, only taking it a couple of times a week  Obesity:she lost some weight since last visit, she is drinking more water, eating more vegetables and fruit and cutting down on eating out   Pre-diabetes : last A1C was 5.8 %, we will recheck level today , she denies polyphagia, polydipsia or polyphagia.   Vitamin D deficiency: she has been taking otc vitamin D daily. We will recheck levels  Dyslipidemia: she is on diet only , and we will recheck labs   Recurrent BV: she scheduled a follow up with gyn, however would like refill of metrogel until her visit with Dr. Marcelline Mates  Patient Active Problem List   Diagnosis Date Noted  . History of COVID-19 10/2019  . GERD (gastroesophageal reflux disease) 09/11/2019  . Pre-diabetes 07/25/2018  . Dyslipidemia 07/25/2018  . Mild major depression ([place]) 07/24/2018  .  Tension headache 07/24/2018  . Stiffness of right hand joint 07/28/2015  . ADD (attention deficit hyperactivity disorder, inattentive type) 07/25/2015  . Pain in toe 07/25/2015  . Chronic constipation 07/25/2015  . Herpes 07/25/2015  . Hemorrhoids, internal 07/25/2015  . Obesity (BMI 30-39.9) 07/25/2015  . Vitamin D deficiency 07/25/2015  . Radiculitis of right cervical region 04/14/2015  . Epicondylitis elbow, medial 10/02/2013  . Entrapment of ulnar nerve 10/02/2013  . Impingement syndrome of shoulder 10/02/2013  . Ovarian cyst 11/15/2011    Past Surgical History:  Procedure Laterality Date  . ENDOMETRIAL BIOPSY    . NOVASURE ABLATION    . OVARIAN CYST REMOVAL     left  . TUBAL LIGATION    . WISDOM TOOTH EXTRACTION     x 1    Family History  Problem Relation Age of Onset  . Diabetes Mother   . Depression Mother   . Hypertension Mother   . Kidney disease Mother   . Hyperlipidemia Mother   . Dementia Mother   . Stroke Mother        March 18, 2018 (currently in rehab)   . Hypertension Father   . Diabetes Father   . Liver disease Father   . Prostate cancer Father   . Asthma Brother   . Cancer Maternal Uncle        prostate  . Thrombocytopenia Son  ITP  . ADD / ADHD Son     Social History   Tobacco Use  . Smoking status: Never Smoker  . Smokeless tobacco: Never Used  Substance Use Topics  . Alcohol use: Yes    Alcohol/week: 2.0 standard drinks    Types: 2 Standard drinks or equivalent per week    Comment: occassionally     Current Outpatient Medications:  .  Cholecalciferol (VITAMIN D) 50 MCG (2000 UT) CAPS, Take 1 capsule (2,000 Units total) by mouth daily., Disp: 30 capsule, Rfl: 0 .  cyclobenzaprine (FLEXERIL) 5 MG tablet, Take 1 tablet (5 mg total) by mouth at bedtime. (Patient taking differently: Take 5 mg by mouth at bedtime as needed for muscle spasms.), Disp: 30 tablet, Rfl: 2 .  HYDROcodone-homatropine (HYCODAN) 5-1.5 MG/5ML syrup, Take 5  mLs by mouth every 6 (six) hours as needed for cough., Disp: 120 mL, Rfl: 0 .  metroNIDAZOLE (METROGEL VAGINAL) 0.75 % vaginal gel, Place 1 Applicatorful vaginally 2 (two) times daily., Disp: 70 g, Rfl: 0 .  pantoprazole (PROTONIX) 40 MG tablet, Take 1 tablet by mouth once daily, Disp: 90 tablet, Rfl: 0 .  vitamin C (VITAMIN C) 500 MG tablet, Take 1 tablet (500 mg total) by mouth daily., Disp: 30 tablet, Rfl: 0  Allergies  Allergen Reactions  . Codeine Itching and Rash    I personally reviewed active problem list, medication list, allergies, family history, social history, health maintenance with the patient/caregiver today.   ROS  Constitutional: Negative for fever or weight change.  Respiratory: Negative for cough and shortness of breath.   Cardiovascular: Negative for chest pain or palpitations.  Gastrointestinal: Negative for abdominal pain, no bowel changes.  Musculoskeletal: Negative for gait problem or joint swelling.  Skin: Negative for rash.  Neurological: Negative for dizziness or headache.  No other specific complaints in a complete review of systems (except as listed in HPI above).  Objective  Vitals:   05/18/21 1352  BP: 120/86  Pulse: 85  Resp: 16  Temp: 98.4 F (36.9 C)  TempSrc: Oral  SpO2: 96%  Weight: 221 lb (100.2 kg)  Height: 5\' 6"  (1.676 m)    Body mass index is 35.67 kg/m.  Physical Exam  Constitutional: Patient appears well-developed and well-nourished. Obese  No distress.  HEENT: head atraumatic, normocephalic, pupils equal and reactive to light,  neck supple Cardiovascular: Normal rate, regular rhythm and normal heart sounds.  No murmur heard. No BLE edema. Pulmonary/Chest: Effort normal and breath sounds normal. No respiratory distress. Abdominal: Soft.  There is no tenderness. Psychiatric: Patient has a normal mood and affect. behavior is normal. Judgment and thought content normal.  Recent Results (from the past 2160 hour(s))   Cervicovaginal ancillary only     Status: Abnormal   Collection Time: 03/24/21  9:33 AM  Result Value Ref Range   Neisseria Gonorrhea Negative    Chlamydia Negative    Trichomonas Negative    Bacterial Vaginitis (gardnerella) Positive (A)    Comment      Normal Reference Range Bacterial Vaginosis - Negative   Comment Normal Reference Range Trichomonas - Negative    Comment Normal Reference Ranger Chlamydia - Negative    Comment      Normal Reference Range Neisseria Gonorrhea - Negative  HIV antibody (with reflex)     Status: None   Collection Time: 03/24/21  9:40 AM  Result Value Ref Range   HIV 1&2 Ab, 4th Generation NON-REACTIVE NON-REACTI    Comment: HIV-1  antigen and HIV-1/HIV-2 antibodies were not detected. There is no laboratory evidence of HIV infection. Marland Kitchen PLEASE NOTE: This information has been disclosed to you from records whose confidentiality may be protected by state law.  If your state requires such protection, then the state law prohibits you from making any further disclosure of the information without the specific written consent of the person to whom it pertains, or as otherwise permitted by law. A general authorization for the release of medical or other information is NOT sufficient for this purpose. . For additional information please refer to http://education.questdiagnostics.com/faq/FAQ106 (This link is being provided for informational/ educational purposes only.) . Marland Kitchen The performance of this assay has not been clinically validated in patients less than 52 years old. .   Hepatitis C Antibody     Status: None   Collection Time: 03/24/21  9:40 AM  Result Value Ref Range   Hepatitis C Ab NON-REACTIVE NON-REACTI   SIGNAL TO CUT-OFF 0.01 <1.00    Comment: . HCV antibody was non-reactive. There is no laboratory  evidence of HCV infection. . In most cases, no further action is required. However, if recent HCV exposure is suspected, a test for HCV  RNA (test code [phone]) is suggested. . For additional information please refer to http://education.questdiagnostics.com/faq/FAQ22v1 (This link is being provided for informational/ educational purposes only.) .   RPR     Status: None   Collection Time: 03/24/21  9:40 AM  Result Value Ref Range   RPR Ser Ql NON-REACTIVE NON-REACTI      PHQ2/9: Depression screen [facility] 2/9 05/18/2021 03/24/2021 11/26/2020 11/17/2020 12/25/2019  Decreased Interest 1 0 0 1 1  Down, Depressed, Hopeless 1 0 0 1 0  PHQ - 2 Score 2 0 0 2 1  Altered sleeping 0 - 1 1 1   Tired, decreased energy 1 - 0 1 1  Change in appetite 2 - 0 1 2  Feeling bad or failure about yourself  0 - 0 0 0  Trouble concentrating 1 - 0 1 1  Moving slowly or fidgety/restless 0 - 0 0 1  Suicidal thoughts 0 - 0 0 0  PHQ-9 Score 6 - 1 6 7   Difficult doing work/chores - - - - Not difficult at all  Some recent data might be hidden    phq 9 is positive   Fall Risk: Fall Risk  05/18/2021 03/24/2021 11/26/2020 11/17/2020 12/25/2019  Falls in the past year? 0 0 0 0 0  Number falls in past yr: 0 0 0 0 0  Injury with Fall? 0 0 0 0 0  Comment - - - - -  Follow up - Falls evaluation completed Falls evaluation completed - -    Functional Status Survey: Is the patient deaf or have difficulty hearing?: Yes Does the patient have difficulty seeing, even when wearing glasses/contacts?: Yes Does the patient have difficulty concentrating, remembering, or making decisions?: Yes Does the patient have difficulty walking or climbing stairs?: No Does the patient have difficulty dressing or bathing?: No Does the patient have difficulty doing errands alone such as visiting a doctor's office or shopping?: No    Assessment & Plan  1. Dyslipidemia  - Lipid panel  2. Mild major depression (HCC)  - CBC with Differential/Platelet - COMPLETE METABOLIC PANEL WITH GFR  3. Obesity (BMI 35.0-39.9 without comorbidity)  Discussed with the patient the risk  posed by an increased BMI. Discussed importance of portion control, calorie counting and at least 150 minutes of physical  activity weekly. Avoid sweet beverages and drink more water. Eat at least 6 servings of fruit and vegetables daily   4. Vitamin D deficiency  - VITAMIN D 25 Hydroxy (Vit-D Deficiency, Fractures)  5. History of anemia  - CBC with Differential/Platelet  6. Pre-diabetes  - Hemoglobin A1c  7. Colon cancer screening  - Ambulatory referral to Gastroenterology   8. Bacterial vaginosis  - metroNIDAZOLE (METROGEL VAGINAL) 0.75 % vaginal gel; Place 1 Applicatorful vaginally 2 (two) times daily.  Dispense: 70 g; Refill: 1

## 2021-05-18 ENCOUNTER — Ambulatory Visit: Payer: 59 | Admitting: Family Medicine

## 2021-05-18 ENCOUNTER — Encounter: Payer: Self-pay | Admitting: Family Medicine

## 2021-05-18 ENCOUNTER — Other Ambulatory Visit: Payer: Self-pay

## 2021-05-18 VITALS — BP 120/86 | HR 85 | Temp 98.4°F | Resp 16 | Ht 66.0 in | Wt 221.0 lb

## 2021-05-18 DIAGNOSIS — Z1211 Encounter for screening for malignant neoplasm of colon: Secondary | ICD-10-CM

## 2021-05-18 DIAGNOSIS — E669 Obesity, unspecified: Secondary | ICD-10-CM | POA: Diagnosis not present

## 2021-05-18 DIAGNOSIS — E785 Hyperlipidemia, unspecified: Secondary | ICD-10-CM | POA: Diagnosis not present

## 2021-05-18 DIAGNOSIS — E559 Vitamin D deficiency, unspecified: Secondary | ICD-10-CM

## 2021-05-18 DIAGNOSIS — R7303 Prediabetes: Secondary | ICD-10-CM

## 2021-05-18 DIAGNOSIS — N760 Acute vaginitis: Secondary | ICD-10-CM

## 2021-05-18 DIAGNOSIS — F320 Major depressive disorder, single episode, mild: Secondary | ICD-10-CM

## 2021-05-18 DIAGNOSIS — B9689 Other specified bacterial agents as the cause of diseases classified elsewhere: Secondary | ICD-10-CM

## 2021-05-18 DIAGNOSIS — Z862 Personal history of diseases of the blood and blood-forming organs and certain disorders involving the immune mechanism: Secondary | ICD-10-CM

## 2021-05-18 MED ORDER — METRONIDAZOLE 0.75 % VA GEL
1.0000 | Freq: Two times a day (BID) | VAGINAL | 1 refills | Status: DC
Start: 2021-05-18 — End: 2021-11-24

## 2021-05-18 NOTE — Addendum Note (Signed)
Addended by: Steele Sizer F on: 05/18/2021 02:27 PM   Modules accepted: Orders

## 2021-05-19 LAB — LIPID PANEL
Cholesterol: 184 mg/dL (ref <200)
HDL: 50 mg/dL (ref >=50)
LDL Cholesterol (Calc): 120 mg/dL (calc) — ABNORMAL HIGH
Non-HDL Cholesterol (Calc): 134 mg/dL (calc) — ABNORMAL HIGH (ref <130)
Total CHOL/HDL Ratio: 3.7 (calc) (ref <5.0)
Triglycerides: 50 mg/dL (ref <150)

## 2021-05-19 LAB — COMPLETE METABOLIC PANEL WITH GFR
AG Ratio: 1.2 (calc) (ref 1.0–2.5)
ALT: 5 U/L — ABNORMAL LOW (ref 6–29)
AST: 9 U/L — ABNORMAL LOW (ref 10–35)
Albumin: 4 g/dL (ref 3.6–5.1)
Alkaline phosphatase (APISO): 58 U/L (ref 31–125)
BUN: 8 mg/dL (ref 7–25)
CO2: 22 mmol/L (ref 20–32)
Calcium: 9 mg/dL (ref 8.6–10.2)
Chloride: 108 mmol/L (ref 98–110)
Creat: 0.71 mg/dL (ref 0.50–1.10)
GFR, Est African American: 118 mL/min/{1.73_m2} (ref >=60)
GFR, Est Non African American: 102 mL/min/{1.73_m2} (ref >=60)
Globulin: 3.3 g/dL (calc) (ref 1.9–3.7)
Glucose, Bld: 78 mg/dL (ref 65–99)
Potassium: 4.2 mmol/L (ref 3.5–5.3)
Sodium: 137 mmol/L (ref 135–146)
Total Bilirubin: 0.3 mg/dL (ref 0.2–1.2)
Total Protein: 7.3 g/dL (ref 6.1–8.1)

## 2021-05-19 LAB — CBC WITH DIFFERENTIAL/PLATELET
Absolute Monocytes: 451 cells/uL (ref 200–950)
Basophils Absolute: 18 cells/uL (ref 0–200)
Basophils Relative: 0.4 %
Eosinophils Absolute: 41 cells/uL (ref 15–500)
Eosinophils Relative: 0.9 %
HCT: 38.4 % (ref 35.0–45.0)
Hemoglobin: 12.2 g/dL (ref 11.7–15.5)
Lymphs Abs: 2157 cells/uL (ref 850–3900)
MCH: 25.6 pg — ABNORMAL LOW (ref 27.0–33.0)
MCHC: 31.8 g/dL — ABNORMAL LOW (ref 32.0–36.0)
MCV: 80.7 fL (ref 80.0–100.0)
MPV: 9.4 fL (ref 7.5–12.5)
Monocytes Relative: 9.8 %
Neutro Abs: 1932 cells/uL (ref 1500–7800)
Neutrophils Relative %: 42 %
Platelets: 277 10*3/uL (ref 140–400)
RBC: 4.76 10*6/uL (ref 3.80–5.10)
RDW: 13.1 % (ref 11.0–15.0)
Total Lymphocyte: 46.9 %
WBC: 4.6 10*3/uL (ref 3.8–10.8)

## 2021-05-19 LAB — HEMOGLOBIN A1C
Hgb A1c MFr Bld: 5.7 % of total Hgb — ABNORMAL HIGH (ref <5.7)
Mean Plasma Glucose: 117 mg/dL
eAG (mmol/L): 6.5 mmol/L

## 2021-05-19 LAB — VITAMIN D 25 HYDROXY (VIT D DEFICIENCY, FRACTURES): Vit D, 25-Hydroxy: 30 ng/mL (ref 30–100)

## 2021-07-12 ENCOUNTER — Ambulatory Visit: Payer: Self-pay | Admitting: *Deleted

## 2021-07-12 NOTE — Telephone Encounter (Signed)
Patient was Covid positive on Saturday. Symptoms began on Friday including stuffy nose, HA, ST and SOB while doing activity.  Denies SOB while resting denies CP. Care advice including Afrin NS and normal saline NS OTC for the nose. Can add oral decongestant-check with pharmacist 1st. Tylenol/Advil for HA. Chloraseptic spray or lozenges for ST. Deep breathing exercises several times daily, drink plenty of fluids. Monitor your breathing if concerned please seek treatment at the ED. Patient stated she understood advice.

## 2021-07-26 ENCOUNTER — Encounter: Payer: Self-pay | Admitting: Family Medicine

## 2021-08-05 ENCOUNTER — Encounter: Payer: 59 | Admitting: Obstetrics and Gynecology

## 2021-08-06 ENCOUNTER — Other Ambulatory Visit (HOSPITAL_COMMUNITY)
Admission: RE | Admit: 2021-08-06 | Discharge: 2021-08-06 | Disposition: A | Discharge status: Home / Self Care | Payer: 59 | Source: Ambulatory Visit | Attending: Obstetrics and Gynecology | Admitting: Obstetrics and Gynecology

## 2021-08-06 ENCOUNTER — Encounter: Payer: Self-pay | Admitting: Obstetrics and Gynecology

## 2021-08-06 ENCOUNTER — Ambulatory Visit: Payer: 59 | Admitting: Obstetrics and Gynecology

## 2021-08-06 ENCOUNTER — Other Ambulatory Visit: Payer: Self-pay

## 2021-08-06 VITALS — BP 129/85 | HR 68 | Resp 16 | Ht 66.0 in | Wt 228.9 lb

## 2021-08-06 DIAGNOSIS — N760 Acute vaginitis: Secondary | ICD-10-CM

## 2021-08-06 DIAGNOSIS — Z86018 Personal history of other benign neoplasm: Secondary | ICD-10-CM

## 2021-08-06 DIAGNOSIS — Z1231 Encounter for screening mammogram for malignant neoplasm of breast: Secondary | ICD-10-CM

## 2021-08-06 DIAGNOSIS — Z1211 Encounter for screening for malignant neoplasm of colon: Secondary | ICD-10-CM | POA: Diagnosis not present

## 2021-08-06 DIAGNOSIS — Z01419 Encounter for gynecological examination (general) (routine) without abnormal findings: Secondary | ICD-10-CM | POA: Diagnosis not present

## 2021-08-06 DIAGNOSIS — R140 Abdominal distension (gaseous): Secondary | ICD-10-CM

## 2021-08-06 MED ORDER — SOLOSEC 2 G PO PACK: 1.0000 | PACK | Freq: Once | ORAL | 0 refills | Status: AC

## 2021-08-06 NOTE — Progress Notes (Signed)
GYNECOLOGY ANNUAL PHYSICAL EXAM PROGRESS NOTE  Subjective:    Emily Eaton is a 45 y.o. G2P0 female who presents to establish care and for an annual exam. Last GYN visit was ~ 2 years ago.  The patient is sexually active. The patient participates in regular exercise: no. Has the patient ever been transfused or tattooed?: no. The patient reports that there is not domestic violence in her life.  The patient has the following complaints today:  Abdominal pain lower pelvic area x 1 month intermittently. She has a history of fibroids and believes that is the cause of her pain.  She has a history of ovarian cyst.  She also has a history of recurring BV infections. Notes that she has been treated several times with tablets and gel.  Symptoms typically resolve for ~ 1 month or so, but then return. Has begun noting symptoms again over the past several days (mild vaginal itching and odor).   Menstrual History: Menarche age: 63 Patient's last menstrual period was 06/14/2021 (exact date). Period Cycle (Days): 21 Period Duration (Days): 5-7 Period Pattern: (!) Irregular Menstrual Flow: Moderate, Heavy, Light Menstrual Control: Maxi pad Menstrual Control Change Freq (Hours): 2-3 Dysmenorrhea: (!) Severe Dysmenorrhea Symptoms: Cramping, Throbbing, Headache   Gynecologic History:  Contraception: condoms and tubal ligation History of STI's: H/o HSV. Last Pap: 09/11/2019. Results were: normal.   Has a h/o 1 abnormal pap smears. Last mammogram: 11/05/2020. Results were: normal Last colonoscopy: Reports colonoscopy 1 year ago. Was normal.    OB History  Gravida Para Term Preterm AB Living  2 0 0 0 0 2  SAB IAB Ectopic Multiple Live Births  0 0 0 0 2    # Outcome Date GA Lbr Len/2nd Weight Sex Delivery Anes PTL Lv  2 Gravida 2000    M    LIV  1 Gravida 1994    M    LIV    Past Medical History:  Diagnosis Date   Acute low back pain    ADD (attention deficit disorder) without  hyperactivity    Anemia    Chronic constipation    COVID-19    Herpes simplex without complication    History of ovarian cyst 04/2010   Internal hemorrhoids    Iron deficiency anemia due to chronic blood loss    PMS (premenstrual syndrome)    Vitamin D deficiency     Past Surgical History:  Procedure Laterality Date   ENDOMETRIAL BIOPSY     NOVASURE ABLATION     OVARIAN CYST REMOVAL     left   TUBAL LIGATION     WISDOM TOOTH EXTRACTION     x 1    Family History  Problem Relation Age of Onset   Diabetes Mother    Depression Mother    Hypertension Mother    Kidney disease Mother    Hyperlipidemia Mother    Dementia Mother    Stroke Mother        March 18, 2018 (currently in rehab)    Hypertension Father    Diabetes Father    Liver disease Father    Prostate cancer Father    Asthma Brother    Cancer Maternal Uncle        prostate   Thrombocytopenia Son        ITP   ADD / ADHD Son     Social History   Socioeconomic History   Marital status: Divorced    Spouse name: Dorthula Rue  Number of children: 2   Years of education: 14   Highest education level: Associate degree: occupational, Hotel manager, or vocational program  Occupational History   Occupation: Pharmacist, hospital    Comment: [employer]   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  Tobacco Use   Smoking status: Never   Smokeless tobacco: Never  Vaping Use   Vaping Use: Never used  Substance and Sexual Activity   Alcohol use: Yes    Alcohol/week: 2.0 standard drinks    Types: 2 Standard drinks or equivalent per week    Comment: occassionally   Drug use: No   Sexual activity: Yes    Partners: Male    Birth control/protection: Condom, Surgical    Comment: Tubaligation  Other Topics Concern   Not on file  Social History Narrative   Divorced July 5th, 2019, but they are still sexually active   Son is in the [employer] going to [place]  from 07/2018 to 06/2019, he is at home right now, he will  return to his job end of the year   Youngest son is at home   Mother in [place] after stroke in 2019   Father diagnosed with prostate cancer 04/2019   Social Determinants of Health   Financial Resource Strain: Not on file  Food Insecurity: Not on file  Transportation Needs: Not on file  Physical Activity: Not on file  Stress: Not on file  Social Connections: Not on file  Intimate Partner Violence: Not on file    Current Outpatient Medications on File Prior to Visit  Medication Sig Dispense Refill   Cholecalciferol (VITAMIN D) 50 MCG (2000 UT) CAPS Take 1 capsule (2,000 Units total) by mouth daily. 30 capsule 0   cyclobenzaprine (FLEXERIL) 5 MG tablet Take 1 tablet (5 mg total) by mouth at bedtime. (Patient taking differently: Take 5 mg by mouth at bedtime as needed for muscle spasms.) 30 tablet 2   metroNIDAZOLE (METROGEL VAGINAL) 0.75 % vaginal gel Place 1 Applicatorful vaginally 2 (two) times daily. 70 g 1   pantoprazole (PROTONIX) 40 MG tablet Take 1 tablet by mouth once daily 90 tablet 0   vitamin C (VITAMIN C) 500 MG tablet Take 1 tablet (500 mg total) by mouth daily. 30 tablet 0   No current facility-administered medications on file prior to visit.    Allergies  Allergen Reactions   Codeine Itching and Rash     Review of Systems Constitutional: negative for chills, fatigue, fevers and sweats Eyes: negative for irritation, redness and visual disturbance Ears, nose, mouth, throat, and face: negative for hearing loss, nasal congestion, snoring and tinnitus Respiratory: negative for asthma, cough, sputum Cardiovascular: negative for chest pain, dyspnea, exertional chest pressure/discomfort, irregular heart beat, palpitations and syncope Gastrointestinal: negative for abdominal pain, change in bowel habits, nausea and vomiting Genitourinary: negative for abnormal menstrual periods, genital lesions, sexual problems and vaginal discharge, dysuria and urinary incontinence.  Positive for vaginal odor and itching and pelvic pain.  Integument/breast: negative for breast lump, breast tenderness and nipple discharge Hematologic/lymphatic: negative for bleeding and easy bruising Musculoskeletal:negative for back pain and muscle weakness Neurological: negative for dizziness, headaches, vertigo and weakness Endocrine: negative for diabetic symptoms including polydipsia, polyuria and skin dryness Allergic/Immunologic: negative for hay fever and urticaria      Objective:  Blood pressure 129/85, pulse 68, resp. rate 16, height '5\' 6"'$  (1.676 m), weight 228 lb 14.4 oz (103.8 kg), last menstrual period 06/14/2021.  Body mass index  is 36.95 kg/m.  General Appearance:    Alert, cooperative, no distress, appears stated age  Head:    Normocephalic, without obvious abnormality, atraumatic  Eyes:    PERRL, conjunctiva/corneas clear, EOM's intact, both eyes  Ears:    Normal external ear canals, both ears  Nose:   Nares normal, septum midline, mucosa normal, no drainage or sinus tenderness  Throat:   Lips, mucosa, and tongue normal; teeth and gums normal  Neck:   Supple, symmetrical, trachea midline, no adenopathy; thyroid: no enlargement/tenderness/nodules; no carotid bruit or JVD  Back:     Symmetric, no curvature, ROM normal, no CVA tenderness  Lungs:     Clear to auscultation bilaterally, respirations unlabored  Chest Wall:    No tenderness or deformity   Heart:    Regular rate and rhythm, S1 and S2 normal, no murmur, rub or gallop  Breast Exam:    No tenderness, masses, or nipple abnormality  Abdomen:     Soft, non-tender, bowel sounds active all four quadrants, no masses, no organomegaly.    Genitalia:    Pelvic:external genitalia normal, vagina without lesions, or tenderness.  Scant thin white discharge in vaginal vault.  Rectovaginal septum  normal. Cervix normal in appearance, no cervical motion tenderness, no adnexal masses or tenderness.  Uterus normal size, shape,  mobile, regular contours, nontender.  Rectal:    Normal external sphincter.  No hemorrhoids appreciated. Internal exam not done.   Extremities:   Extremities normal, atraumatic, no cyanosis or edema  Pulses:   2+ and symmetric all extremities  Skin:   Skin color, texture, turgor normal, no rashes or lesions  Lymph nodes:   Cervical, supraclavicular, and axillary nodes normal  Neurologic:   CNII-XII intact, normal strength, sensation and reflexes throughout   .  Labs:  Lab Results  Component Value Date   WBC 4.6 05/18/2021   HGB 12.2 05/18/2021   HCT 38.4 05/18/2021   MCV 80.7 05/18/2021   PLT 277 05/18/2021    Lab Results  Component Value Date   CREATININE 0.71 05/18/2021   BUN 8 05/18/2021   NA 137 05/18/2021   K 4.2 05/18/2021   CL 108 05/18/2021   CO2 22 05/18/2021    Lab Results  Component Value Date   ALT 5 (L) 05/18/2021   AST 9 (L) 05/18/2021   ALKPHOS 45 11/18/2019   BILITOT 0.3 05/18/2021    Lab Results  Component Value Date   TSH 1.08 06/07/2017     Assessment:   1. Women's annual routine gynecological examination   2. Colon cancer screening   3. Encounter for screening mammogram for malignant neoplasm of breast   4. Recurrent vaginitis   5. Abdominal bloating   6. History of uterine fibroid      Plan:  Blood tests: None ordered.  Labs performed by PCP, up to date. Breast self exam technique reviewed and patient encouraged to perform self-exam monthly. Contraception: condoms and tubal ligation. Discussed healthy lifestyle modifications. Mammogram ordered.  Pap smear  up to date. Will perform next year.  Marland Kitchen COVID vaccination status: Has completed Moderna vaccination series. Is eligible for booster.  Discussed prevention methods for recurrence of HPV. Nuswab performed today. Will also prescribe Solosec.  Will order pelvic ultrasound to assess abdominal/pelvic pain. Patient with h/o fibroids and ovarian cyst in the past.  Follow up in 1 year for  annual exam  Rubie Maid, MD [facility]

## 2021-08-06 NOTE — Patient Instructions (Addendum)
PREVENTION OF RECURRENT VAGINITIS:   1. First and foremost, eat a healthy diet rich in fruits and vegetables and low in sugar and refined carbohydrates. Studies have shown that yeast and harmful bacteria are more likely to be a problem in people with high sugar diets. One food believed to be particularly helpful is garlic. 2. Support your immune system: reduce stress, get adequate sleep and exercise regularly. 3. Eat plain, probiotic yogurt daily or take a daily vaginal probiotic (can be found in the feminine aisle at any pharmacy) containing Acidophillus (they should contain at least 1 billion CFU's- this is information found on the bottle).  4. Avoid harsh soaps, and detergents.  Can use vaginal washes like Vagisil or Summer's Eve.  5.Avoid routine douching, and if you notice that sex can trigger infections for you, use condoms. For some women who get infections easily, both douching and the alkaline nature of semen can disrupt the vaginal pH.   Preventive Care 47-35 Years Old, Female Preventive care refers to lifestyle choices and visits with your health care provider that can promote health and wellness. This includes: A yearly physical exam. This is also called an annual wellness visit. Regular dental and eye exams. Immunizations. Screening for certain conditions. Healthy lifestyle choices, such as: Eating a healthy diet. Getting regular exercise. Not using drugs or products that contain nicotine and tobacco. Limiting alcohol use. What can I expect for my preventive care visit? Physical exam Your health care provider will check your: Height and weight. These may be used to calculate your BMI (body mass index). BMI is a measurement that tells if you are at a healthy weight. Heart rate and blood pressure. Body temperature. Skin for abnormal spots. Counseling Your health care provider may ask you questions about your: Past medical problems. Family's medical history. Alcohol, tobacco,  and drug use. Emotional well-being. Home life and relationship well-being. Sexual activity. Diet, exercise, and sleep habits. Work and work Statistician. Access to firearms. Method of birth control. Menstrual cycle. Pregnancy history. What immunizations do I need?  Vaccines are usually given at various ages, according to a schedule. Your health care provider will recommend vaccines for you based on your age, medicalhistory, and lifestyle or other factors, such as travel or where you work. What tests do I need? Blood tests Lipid and cholesterol levels. These may be checked every 5 years, or more often if you are over 47 years old. Hepatitis C test. Hepatitis B test. Screening Lung cancer screening. You may have this screening every year starting at age 47 if you have a 30-pack-year history of smoking and currently smoke or have quit within the past 15 years. Colorectal cancer screening. All adults should have this screening starting at age 47 and continuing until age 54. Your health care provider may recommend screening at age 47 if you are at increased risk. You will have tests every 1-10 years, depending on your results and the type of screening test. Diabetes screening. This is done by checking your blood sugar (glucose) after you have not eaten for a while (fasting). You may have this done every 1-3 years. Mammogram. This may be done every 1-2 years. Talk with your health care provider about when you should start having regular mammograms. This may depend on whether you have a family history of breast cancer. BRCA-related cancer screening. This may be done if you have a family history of breast, ovarian, tubal, or peritoneal cancers. Pelvic exam and Pap test. This may be done  every 3 years starting at age 47. Starting at age 47, this may be done every 5 years if you have a Pap test in combination with an HPV test. every 5 years if you have a Pap test in combination with an HPV test. Other tests STD (sexually transmitted disease) testing, if you are  at risk. Bone density scan. This is done to screen for osteoporosis. You may have this scan if you are at high risk for osteoporosis. Talk with your health care provider about your test results, treatment options,and if necessary, the need for more tests. Follow these instructions at home: Eating and drinking  Eat a diet that includes fresh fruits and vegetables, whole grains, lean protein, and low-fat dairy products. Take vitamin and mineral supplements as recommended by your health care provider. Do not drink alcohol if: Your health care provider tells you not to drink. You are pregnant, may be pregnant, or are planning to become pregnant. If you drink alcohol: Limit how much you have to 0-1 drink a day. Be aware of how much alcohol is in your drink. In the [place] one drink equals one 12 oz bottle of beer (355 mL), one 5 oz glass of wine (148 mL), or one 1 oz glass of hard liquor (44 mL).  Lifestyle Take daily care of your teeth and gums. Brush your teeth every morning and night with fluoride toothpaste. Floss one time each day. Stay active. Exercise for at least 30 minutes 5 or more days each week. Do not use any products that contain nicotine or tobacco, such as cigarettes, e-cigarettes, and chewing tobacco. If you need help quitting, ask your health care provider. Do not use drugs. If you are sexually active, practice safe sex. Use a condom or other form of protection to prevent STIs (sexually transmitted infections). If you do not wish to become pregnant, use a form of birth control. If you plan to become pregnant, see your health care provider for a prepregnancy visit. If told by your health care provider, take low-dose aspirin daily starting at age 47. Find healthy ways to cope with stress, such as: Meditation, yoga, or listening to music. Journaling. Talking to a trusted person. Spending time with friends and family. Safety Always wear your seat belt while driving or riding in a  vehicle. Do not drive: If you have been drinking alcohol. Do not ride with someone who has been drinking. When you are tired or distracted. While texting. Wear a helmet and other protective equipment during sports activities. If you have firearms in your house, make sure you follow all gun safety procedures. What's next? Visit your health care provider once a year for an annual wellness visit. Ask your health care provider how often you should have your eyes and teeth checked. Stay up to date on all vaccines. This information is not intended to replace advice given to you by your health care provider. Make sure you discuss any questions you have with your healthcare provider. Document Revised: 09/08/2020 Document Reviewed: 08/16/2018 Elsevier Patient Education  2022 [employer].

## 2021-08-07 ENCOUNTER — Encounter: Payer: Self-pay | Admitting: Obstetrics and Gynecology

## 2021-08-10 LAB — CERVICOVAGINAL ANCILLARY ONLY
Bacterial Vaginitis (gardnerella): POSITIVE — AB
Candida Glabrata: NEGATIVE
Candida Vaginitis: NEGATIVE
Comment: NEGATIVE
Comment: NEGATIVE
Comment: NEGATIVE
Comment: NEGATIVE
Trichomonas: NEGATIVE

## 2021-08-18 ENCOUNTER — Encounter: Payer: Self-pay | Admitting: Obstetrics and Gynecology

## 2021-09-02 ENCOUNTER — Ambulatory Visit: Payer: 59 | Admitting: Nurse Practitioner

## 2021-09-02 ENCOUNTER — Other Ambulatory Visit (HOSPITAL_COMMUNITY)
Admission: RE | Admit: 2021-09-02 | Discharge: 2021-09-02 | Disposition: A | Discharge status: Home / Self Care | Payer: 59 | Source: Ambulatory Visit | Attending: Family Medicine | Admitting: Family Medicine

## 2021-09-02 ENCOUNTER — Encounter: Payer: Self-pay | Admitting: Nurse Practitioner

## 2021-09-02 ENCOUNTER — Other Ambulatory Visit: Payer: Self-pay

## 2021-09-02 VITALS — BP 118/82 | HR 64 | Temp 98.2°F | Resp 14 | Ht 66.0 in | Wt 226.4 lb

## 2021-09-02 DIAGNOSIS — N760 Acute vaginitis: Secondary | ICD-10-CM | POA: Insufficient documentation

## 2021-09-02 DIAGNOSIS — B9689 Other specified bacterial agents as the cause of diseases classified elsewhere: Secondary | ICD-10-CM | POA: Diagnosis present

## 2021-09-02 DIAGNOSIS — R350 Frequency of micturition: Secondary | ICD-10-CM

## 2021-09-02 LAB — POCT URINALYSIS DIPSTICK
Bilirubin, UA: NEGATIVE
Glucose, UA: NEGATIVE
Nitrite, UA: NEGATIVE
Protein, UA: POSITIVE — AB
Spec Grav, UA: 1.01 (ref 1.010–1.025)
Urobilinogen, UA: 0.2 E.U./dL
pH, UA: 5 (ref 5.0–8.0)

## 2021-09-02 MED ORDER — NITROFURANTOIN MONOHYD MACRO 100 MG PO CAPS: 100.0000 mg | ORAL_CAPSULE | Freq: Two times a day (BID) | ORAL | 0 refills | Status: AC

## 2021-09-02 NOTE — Progress Notes (Signed)
BP 118/82   Pulse 64   Temp 98.2 F (36.8 C) (Oral)   Resp 14   Ht '5\' 6"'$  (1.676 m)   Wt 226 lb 6.4 oz (102.7 kg)   SpO2 99%   BMI 36.54 kg/m    Subjective:    Patient ID: Emily Eaton, female    DOB: [birth year], 45 y.o.   MRN: [id number]  HPI: Emily Eaton is a 45 y.o. female, came alone  Chief Complaint  Patient presents with   Urinary Tract Infection    Burning, frequency for 4 days   Urinary Frequency:  She says she has had urinary frequency and dysuria for 4 days.  She denies any hematuria, fever, or flank pain.  She does say she has lower back pain and suprapubic pain. Pain described as achy. Pain a 3/10. Will start antibiotic and get culture.   Reoccurring bacterial vaginosis:  Last tested last month.  She saw GYN  08/06/21.  She was treated with one dose of Solosec.  She continues to have a small amount of vaginal discharge.  We will test her today.  Discussed treatment plan.  She is agreeable to plan.   Relevant past medical, surgical, family and social history reviewed and updated as indicated. Interim medical history since our last visit reviewed. Allergies and medications reviewed and updated.  Review of Systems  Constitutional: Negative for fever or weight change.  Respiratory: Negative for cough and shortness of breath.   Cardiovascular: Negative for chest pain or palpitations.  Gastrointestinal: Positive for suprapubic abdominal pain, no bowel changes.  Genitourinary: positive for urinary frequency and dysuria Musculoskeletal: Negative for gait problem or joint swelling.  Skin: Negative for rash.  Neurological: Negative for dizziness or headache.  No other specific complaints in a complete review of systems (except as listed in HPI above).      Objective:    BP 118/82   Pulse 64   Temp 98.2 F (36.8 C) (Oral)   Resp 14   Ht '5\' 6"'$  (1.676 m)   Wt 226 lb 6.4 oz (102.7 kg)   SpO2 99%   BMI 36.54 kg/m   Wt Readings from Last 3 Encounters:   09/02/21 226 lb 6.4 oz (102.7 kg)  08/06/21 228 lb 14.4 oz (103.8 kg)  05/18/21 221 lb (100.2 kg)    Physical Exam  Constitutional: Patient appears well-developed and well-nourished. No distress.  HEENT: head atraumatic, normocephalic, pupils equal and reactive to light,  neck supple Cardiovascular: Normal rate, regular rhythm and normal heart sounds.  No murmur heard. No BLE edema. Pulmonary/Chest: Effort normal and breath sounds normal. No respiratory distress. Abdominal: Soft.  There is no tenderness. No CVA tenderness Psychiatric: Patient has a normal mood and affect. behavior is normal. Judgment and thought content normal.   Results for orders placed or performed in visit on 09/02/21  POCT urinalysis dipstick  Result Value Ref Range   Color, UA gold    Clarity, UA cloudy    Glucose, UA Negative Negative   Bilirubin, UA negative    Ketones, UA large    Spec Grav, UA 1.010 1.010 - 1.025   Blood, UA trace    pH, UA 5.0 5.0 - 8.0   Protein, UA Positive (A) Negative   Urobilinogen, UA 0.2 0.2 or 1.0 E.U./dL   Nitrite, UA negative    Leukocytes, UA Small (1+) (A) Negative   Appearance cloudy    Odor none       Assessment &  Plan:   1. Urine frequency  - POCT urinalysis dipstick - Urine Culture - nitrofurantoin, macrocrystal-monohydrate, (MACROBID) 100 MG capsule; Take 1 capsule (100 mg total) by mouth 2 (two) times daily for 5 days.  Dispense: 10 capsule; Refill: 0 - Cervicovaginal ancillary only  2. BV (bacterial vaginosis)  - Cervicovaginal ancillary only   Follow up plan: No follow-ups on file.

## 2021-09-03 LAB — URINE CULTURE
MICRO NUMBER:: 12379661
SPECIMEN QUALITY:: ADEQUATE

## 2021-09-06 ENCOUNTER — Other Ambulatory Visit: Payer: 59

## 2021-09-06 ENCOUNTER — Other Ambulatory Visit: Payer: Self-pay | Admitting: Nurse Practitioner

## 2021-09-06 DIAGNOSIS — B9689 Other specified bacterial agents as the cause of diseases classified elsewhere: Secondary | ICD-10-CM

## 2021-09-06 LAB — CERVICOVAGINAL ANCILLARY ONLY
Bacterial Vaginitis (gardnerella): POSITIVE — AB
Candida Glabrata: NEGATIVE
Candida Vaginitis: NEGATIVE
Chlamydia: NEGATIVE
Comment: NEGATIVE
Comment: NEGATIVE
Comment: NEGATIVE
Comment: NEGATIVE
Comment: NEGATIVE
Comment: NORMAL
Neisseria Gonorrhea: NEGATIVE
Trichomonas: NEGATIVE

## 2021-09-06 MED ORDER — SOLOSEC 2 G PO PACK: 2.0000 g | PACK | Freq: Once | ORAL | 0 refills | Status: AC

## 2021-09-10 ENCOUNTER — Other Ambulatory Visit: Payer: 59

## 2021-09-21 ENCOUNTER — Encounter: Payer: Self-pay | Admitting: Obstetrics and Gynecology

## 2021-09-21 ENCOUNTER — Other Ambulatory Visit: Payer: 59

## 2021-10-18 ENCOUNTER — Ambulatory Visit: Payer: 59 | Admitting: Family Medicine

## 2021-11-23 NOTE — Progress Notes (Signed)
Name: Emily Eaton   MRN: [id number]    DOB: September 07, 1976   Date:11/24/2021       Progress Note  Subjective  Chief Complaint  Follow Up  HPI  GERD: she states reflux symptoms are under control. She is only taking Pantoprazole prn only, symptoms usually controlled with dietary modification, GERD appropriate diet   Major Depression: she has a long history of depression, she was doing better and stopped taking Celexa 2018 She states mother had a major stroke years ago, she went through a  divorce 06/2018, younger son has ITP ( found out in 2018, he is doing better now ), he now has a son and she helps watch him ( her grandson) Older son is back from the being deployed and working full time. She works two jobs and feels tired. She states not feeling like she is in a dark place. She is feeling overwhelmed mostly at work , due to being Youth worker. She has been off medications , she states worse symptoms is lack of focus.   Tension headaches: she has around her cycles but her cycles not as heavy lately -mostly spotting and not problems with headaches lately.  Pain is usually nuchal or occipital, no radiation, not associated with nausea or vomiting.    Insomnia: she stopped Temazepam and is doing well on Melatonin otc prn. 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   Dyslipidemia: she is on diet only  The 10-year ASCVD risk score (Arnett DK, et al., 2019) is: 1.7%   Values used to calculate the score:     Age: 45 years     Sex: Female     Is Non-Hispanic African American: Yes     Diabetic: No     Tobacco smoker: No     Systolic Blood Pressure: 989 mmHg     Is BP  treated: No     HDL Cholesterol: 50 mg/dL     Total Cholesterol: 184 mg/dL   Recurrent BV: she continues to have recurrent vaginal discharge and odor, positive cultures before. She is tired, worse after her cycles. She has tried multiple therapies. Discussed maintenance and she is willing to try metrogel twice a week after initial therapy   Back spasms: intermittent , she is out of flexeril and would like a refill, no radiculitis   Patient Active Problem List   Diagnosis Date Noted   History of COVID-19 10/2019   GERD (gastroesophageal reflux disease) 09/11/2019   Pre-diabetes 07/25/2018   Dyslipidemia 07/25/2018   Mild major depression ([place]) 07/24/2018   Tension headache 07/24/2018   Stiffness of right hand joint 07/28/2015   ADD (attention deficit hyperactivity disorder, inattentive type) 07/25/2015   Pain in toe 07/25/2015   Chronic constipation 07/25/2015   Herpes 07/25/2015   Hemorrhoids, internal 07/25/2015   Obesity (BMI 30-39.9) 07/25/2015   Vitamin D deficiency 07/25/2015   Radiculitis of right cervical region 04/14/2015   Epicondylitis elbow, medial 10/02/2013   Entrapment of ulnar nerve 10/02/2013   Impingement syndrome of shoulder 10/02/2013   Ovarian cyst 11/15/2011    Past Surgical History:  Procedure Laterality Date  ENDOMETRIAL BIOPSY     NOVASURE ABLATION     OVARIAN CYST REMOVAL     left   TUBAL LIGATION     WISDOM TOOTH EXTRACTION     x 1    Family History  Problem Relation Age of Onset   Diabetes Mother    Depression Mother    Hypertension Mother    Kidney disease Mother    Hyperlipidemia Mother    Dementia Mother    Stroke Mother        March 18, 2018 (currently in rehab)    Hypertension Father    Diabetes Father    Liver disease Father    Prostate cancer Father    Asthma Brother    Cancer Maternal Uncle        prostate   Thrombocytopenia Son        ITP   ADD / ADHD Son     Social History   Tobacco Use   Smoking status: Never    Smokeless tobacco: Never  Substance Use Topics   Alcohol use: Yes    Alcohol/week: 2.0 standard drinks    Types: 2 Standard drinks or equivalent per week    Comment: occassionally     Current Outpatient Medications:    Cholecalciferol (VITAMIN D) 50 MCG (2000 UT) CAPS, Take 1 capsule (2,000 Units total) by mouth daily., Disp: 30 capsule, Rfl: 0   cyclobenzaprine (FLEXERIL) 5 MG tablet, Take 1 tablet (5 mg total) by mouth at bedtime. (Patient taking differently: Take 5 mg by mouth at bedtime as needed for muscle spasms.), Disp: 30 tablet, Rfl: 2   metroNIDAZOLE (METROGEL VAGINAL) 0.75 % vaginal gel, Place 1 Applicatorful vaginally 2 (two) times daily., Disp: 70 g, Rfl: 1   pantoprazole (PROTONIX) 40 MG tablet, Take 1 tablet by mouth once daily, Disp: 90 tablet, Rfl: 0   vitamin C (VITAMIN C) 500 MG tablet, Take 1 tablet (500 mg total) by mouth daily., Disp: 30 tablet, Rfl: 0   vitamin E 1000 UNIT capsule, Take 1,000 Units by mouth daily., Disp: , Rfl:   Allergies  Allergen Reactions   Codeine Itching and Rash    I personally reviewed active problem list, medication list, allergies, family history, social history, health maintenance with the patient/caregiver today.   ROS  Constitutional: Negative for fever or weight change.  Respiratory: Negative for cough and shortness of breath.   Cardiovascular: Negative for chest pain or palpitations.  Gastrointestinal: Negative for abdominal pain, no bowel changes.  Musculoskeletal: Negative for gait problem or joint swelling.  Skin: Negative for rash.  Neurological: Negative for dizziness or headache.  No other specific complaints in a complete review of systems (except as listed in HPI above).   Objective  Vitals:   11/24/21 1525  BP: 132/88  Pulse: 75  Resp: 16  Temp: 98.3 F (36.8 C)  TempSrc: Oral  SpO2: 99%  Weight: 225 lb 3.2 oz (102.2 kg)  Height: 5\' 6"  (1.676 m)    Body mass index is 36.35 kg/m.  Physical  Exam  Constitutional: Patient appears well-developed and well-nourished. Obese  No distress.  HEENT: head atraumatic, normocephalic, pupils equal and reactive to light, neck supple Cardiovascular: Normal rate, regular rhythm and normal heart sounds.  No murmur heard. No BLE edema. Pulmonary/Chest: Effort normal and breath sounds normal. No respiratory distress. Abdominal: Soft.  There is no tenderness. Psychiatric: Patient has a normal mood and affect. behavior is normal. Judgment and thought content normal.   Recent  Results (from the past 2160 hour(s))  POCT urinalysis dipstick     Status: Abnormal   Collection Time: 09/02/21  3:15 PM  Result Value Ref Range   Color, UA gold    Clarity, UA cloudy    Glucose, UA Negative Negative   Bilirubin, UA negative    Ketones, UA large    Spec Grav, UA 1.010 1.010 - 1.025   Blood, UA trace    pH, UA 5.0 5.0 - 8.0   Protein, UA Positive (A) Negative   Urobilinogen, UA 0.2 0.2 or 1.0 E.U./dL   Nitrite, UA negative    Leukocytes, UA Small (1+) (A) Negative   Appearance cloudy    Odor none   Urine Culture     Status: None   Collection Time: 09/02/21  3:33 PM   Specimen: Urine  Result Value Ref Range   MICRO NUMBER: [id number]    SPECIMEN QUALITY: Adequate    Sample Source URINE    STATUS: FINAL    ISOLATE 1:      Mixed genital flora isolated. These superficial bacteria are not indicative of a urinary tract infection. No further organism identification is warranted on this specimen. If clinically indicated, recollect clean-catch, mid-stream urine and transfer  immediately to Urine Culture Transport Tube.   Cervicovaginal ancillary only     Status: Abnormal   Collection Time: 09/02/21  3:35 PM  Result Value Ref Range   Neisseria Gonorrhea Negative    Chlamydia Negative    Trichomonas Negative    Bacterial Vaginitis (gardnerella) Positive (A)    Candida Vaginitis Negative    Candida Glabrata Negative    Comment      Normal Reference  Range Bacterial Vaginosis - Negative   Comment Normal Reference Range Candida Species - Negative    Comment Normal Reference Range Candida Galbrata - Negative    Comment Normal Reference Range Trichomonas - Negative    Comment Normal Reference Ranger Chlamydia - Negative    Comment      Normal Reference Range Neisseria Gonorrhea - Negative    PHQ2/9: Depression screen [facility] 2/9 11/24/2021 09/02/2021 08/06/2021 05/18/2021 03/24/2021  Decreased Interest 1 0 0 1 0  Down, Depressed, Hopeless 1 0 0 1 0  PHQ - 2 Score 2 0 0 2 0  Altered sleeping 1 - - 0 -  Tired, decreased energy 2 - - 1 -  Change in appetite 2 - - 2 -  Feeling bad or failure about yourself  0 - - 0 -  Trouble concentrating 1 - - 1 -  Moving slowly or fidgety/restless 0 - - 0 -  Suicidal thoughts 0 - - 0 -  PHQ-9 Score 8 - - 6 -  Difficult doing work/chores Not difficult at all - - - -  Some recent data might be hidden    phq 9 is positive   Fall Risk: Fall Risk  11/24/2021 09/02/2021 08/06/2021 05/18/2021 03/24/2021  Falls in the past year? 0 0 0 0 0  Number falls in past yr: - 0 0 0 0  Injury with Fall? - 0 0 0 0  Comment - - - - -  Follow up Falls prevention discussed Falls evaluation completed - - Falls evaluation completed      Functional Status Survey: Is the patient deaf or have difficulty hearing?: No Does the patient have difficulty seeing, even when wearing glasses/contacts?: No Does the patient have difficulty concentrating, remembering, or making decisions?: Yes Does the patient have difficulty  walking or climbing stairs?: No Does the patient have difficulty dressing or bathing?: No Does the patient have difficulty doing errands alone such as visiting a doctor's office or shopping?: No    Assessment & Plan  1. Major depression, recurrent, chronic (HCC)  - buPROPion (WELLBUTRIN XL) 150 MG 24 hr tablet; Take 1 tablet (150 mg total) by mouth in the morning.  Dispense: 90 tablet; Refill: 1  2.  Pre-diabetes   3. Dyslipidemia  Continue life style modification  4. Vitamin D deficiency  Continue supplementation   5. Colon cancer screening  She will call [facility]  6. Obesity (BMI 30-39.9)   7. Bacterial vaginosis  - metroNIDAZOLE (METROGEL VAGINAL) 0.75 % vaginal gel; Place 1 Applicatorful vaginally at bedtime. After initial therapy ( 7 days )  use twice a week for 5 months  Dispense: 70 g; Refill: 5  8. Intermittent low back pain  - cyclobenzaprine (FLEXERIL) 5 MG tablet; Take 1 tablet (5 mg total) by mouth at bedtime as needed for muscle spasms.  Dispense: 30 tablet; Refill: 0

## 2021-11-24 ENCOUNTER — Ambulatory Visit: Payer: 59 | Admitting: Family Medicine

## 2021-11-24 ENCOUNTER — Encounter: Payer: Self-pay | Admitting: Family Medicine

## 2021-11-24 VITALS — BP 132/88 | HR 75 | Temp 98.3°F | Resp 16 | Ht 66.0 in | Wt 225.2 lb

## 2021-11-24 DIAGNOSIS — F339 Major depressive disorder, recurrent, unspecified: Secondary | ICD-10-CM | POA: Diagnosis not present

## 2021-11-24 DIAGNOSIS — N760 Acute vaginitis: Secondary | ICD-10-CM

## 2021-11-24 DIAGNOSIS — E785 Hyperlipidemia, unspecified: Secondary | ICD-10-CM

## 2021-11-24 DIAGNOSIS — M545 Low back pain, unspecified: Secondary | ICD-10-CM

## 2021-11-24 DIAGNOSIS — E669 Obesity, unspecified: Secondary | ICD-10-CM

## 2021-11-24 DIAGNOSIS — E559 Vitamin D deficiency, unspecified: Secondary | ICD-10-CM

## 2021-11-24 DIAGNOSIS — R7303 Prediabetes: Secondary | ICD-10-CM

## 2021-11-24 DIAGNOSIS — Z1211 Encounter for screening for malignant neoplasm of colon: Secondary | ICD-10-CM

## 2021-11-24 DIAGNOSIS — B9689 Other specified bacterial agents as the cause of diseases classified elsewhere: Secondary | ICD-10-CM

## 2021-11-24 MED ORDER — METRONIDAZOLE 0.75 % VA GEL: 1.0000 | Freq: Every day | VAGINAL | 5 refills | Status: DC

## 2021-11-24 MED ORDER — BUPROPION HCL ER (XL) 150 MG PO TB24: 150.0000 mg | ORAL_TABLET | Freq: Every morning | ORAL | 1 refills | Status: DC

## 2021-11-24 MED ORDER — CYCLOBENZAPRINE HCL 5 MG PO TABS: 5.0000 mg | ORAL_TABLET | Freq: Every evening | ORAL | 0 refills | Status: DC | PRN

## 2021-12-28 ENCOUNTER — Other Ambulatory Visit (HOSPITAL_COMMUNITY)
Admission: RE | Admit: 2021-12-28 | Discharge: 2021-12-28 | Disposition: A | Discharge status: Home / Self Care | Payer: 59 | Source: Ambulatory Visit | Attending: Unknown Physician Specialty | Admitting: Unknown Physician Specialty

## 2021-12-28 ENCOUNTER — Ambulatory Visit: Payer: 59 | Admitting: Unknown Physician Specialty

## 2021-12-28 ENCOUNTER — Encounter: Payer: Self-pay | Admitting: Unknown Physician Specialty

## 2021-12-28 VITALS — BP 132/78 | HR 96 | Temp 97.9°F | Resp 16 | Ht 66.0 in | Wt 227.3 lb

## 2021-12-28 DIAGNOSIS — Z202 Contact with and (suspected) exposure to infections with a predominantly sexual mode of transmission: Secondary | ICD-10-CM | POA: Diagnosis not present

## 2021-12-28 NOTE — Progress Notes (Signed)
BP 132/78    Pulse 96    Temp 97.9 F (36.6 C) (Oral)    Resp 16    Ht 5\' 6"  (1.676 m)    Wt 227 lb 4.8 oz (103.1 kg)    SpO2 98%    BMI 36.69 kg/m    Subjective:    Patient ID: Emily Eaton, female    DOB: 06/15/1976, 46 y.o.   MRN: [id number]  HPI: Emily Eaton is a 46 y.o. female  Chief Complaint  Patient presents with   Mass    Pt states felt a bump on her left side of the vagina area x3 weeks ago. It got bigger and as of now the size has been decreasing no more pain but still concerned.   Pt states this got larger and now feels much better.  She is wanting an STD test.    Relevant past medical, surgical, family and social history reviewed and updated as indicated. Interim medical history since our last visit reviewed. Allergies and medications reviewed and updated.  Review of Systems  Per HPI unless specifically indicated above     Objective:    BP 132/78    Pulse 96    Temp 97.9 F (36.6 C) (Oral)    Resp 16    Ht 5\' 6"  (1.676 m)    Wt 227 lb 4.8 oz (103.1 kg)    SpO2 98%    BMI 36.69 kg/m   Wt Readings from Last 3 Encounters:  12/28/21 227 lb 4.8 oz (103.1 kg)  11/24/21 225 lb 3.2 oz (102.2 kg)  09/02/21 226 lb 6.4 oz (102.7 kg)    Physical Exam Exam conducted with a chaperone present.  Constitutional:      General: She is not in acute distress.    Appearance: Normal appearance. She is well-developed.  HENT:     Head: Normocephalic and atraumatic.  Eyes:     General: Lids are normal. No scleral icterus.       Right eye: No discharge.        Left eye: No discharge.     Conjunctiva/sclera: Conjunctivae normal.  Cardiovascular:     Rate and Rhythm: Normal rate.  Pulmonary:     Effort: Pulmonary effort is normal.  Abdominal:     Palpations: There is no hepatomegaly or splenomegaly.     Hernia: There is no hernia in the left inguinal area or right inguinal area.  Genitourinary:    Exam position: Prone.     Pubic Area: No rash.      Labia:         Right: No rash, tenderness, lesion or injury.        Left: No rash, tenderness, lesion or injury.   Musculoskeletal:        General: Normal range of motion.  Skin:    Coloration: Skin is not pale.     Findings: No rash.  Neurological:     Mental Status: She is alert and oriented to person, place, and time.  Psychiatric:        Behavior: Behavior normal.        Thought Content: Thought content normal.        Judgment: Judgment normal.    Results for orders placed or performed in visit on 09/02/21  Urine Culture   Specimen: Urine  Result Value Ref Range   MICRO NUMBER: [id number]    SPECIMEN QUALITY: Adequate    Sample Source URINE  STATUS: FINAL    ISOLATE 1:      Mixed genital flora isolated. These superficial bacteria are not indicative of a urinary tract infection. No further organism identification is warranted on this specimen. If clinically indicated, recollect clean-catch, mid-stream urine and transfer  immediately to Urine Culture Transport Tube.   POCT urinalysis dipstick  Result Value Ref Range   Color, UA gold    Clarity, UA cloudy    Glucose, UA Negative Negative   Bilirubin, UA negative    Ketones, UA large    Spec Grav, UA 1.010 1.010 - 1.025   Blood, UA trace    pH, UA 5.0 5.0 - 8.0   Protein, UA Positive (A) Negative   Urobilinogen, UA 0.2 0.2 or 1.0 E.U./dL   Nitrite, UA negative    Leukocytes, UA Small (1+) (A) Negative   Appearance cloudy    Odor none   Cervicovaginal ancillary only  Result Value Ref Range   Neisseria Gonorrhea Negative    Chlamydia Negative    Trichomonas Negative    Bacterial Vaginitis (gardnerella) Positive (A)    Candida Vaginitis Negative    Candida Glabrata Negative    Comment      Normal Reference Range Bacterial Vaginosis - Negative   Comment Normal Reference Range Candida Species - Negative    Comment Normal Reference Range Candida Galbrata - Negative    Comment Normal Reference Range Trichomonas - Negative    Comment  Normal Reference Ranger Chlamydia - Negative    Comment      Normal Reference Range Neisseria Gonorrhea - Negative      Assessment & Plan:   Problem List Items Addressed This Visit   None Visit Diagnoses     Potential exposure to STD    -  Primary   No abnormalities noted by me.  Suspect a resolved ingrown hair.  STD testing per pt request.     Relevant Orders   HIV Antibody (routine testing w rflx)   RPR   Cervicovaginal ancillary only        Follow up plan: Return if symptoms worsen or fail to improve.

## 2021-12-29 LAB — HIV ANTIBODY (ROUTINE TESTING W REFLEX): HIV 1&2 Ab, 4th Generation: NONREACTIVE

## 2021-12-29 LAB — RPR: RPR Ser Ql: NONREACTIVE

## 2021-12-30 LAB — CERVICOVAGINAL ANCILLARY ONLY
Chlamydia: NEGATIVE
Comment: NEGATIVE
Comment: NEGATIVE
Comment: NORMAL
Neisseria Gonorrhea: NEGATIVE
Trichomonas: NEGATIVE

## 2022-02-08 ENCOUNTER — Ambulatory Visit
Admission: EM | Admit: 2022-02-08 | Discharge: 2022-02-08 | Disposition: A | Discharge status: Home / Self Care | Payer: 59 | Attending: Internal Medicine | Admitting: Internal Medicine

## 2022-02-08 ENCOUNTER — Other Ambulatory Visit: Payer: Self-pay

## 2022-02-08 DIAGNOSIS — S29019A Strain of muscle and tendon of unspecified wall of thorax, initial encounter: Secondary | ICD-10-CM

## 2022-02-08 DIAGNOSIS — R0789 Other chest pain: Secondary | ICD-10-CM | POA: Diagnosis not present

## 2022-02-08 MED ORDER — BACLOFEN 10 MG PO TABS: 10.0000 mg | ORAL_TABLET | Freq: Three times a day (TID) | ORAL | 0 refills | Status: DC

## 2022-02-08 MED ORDER — IBUPROFEN 600 MG PO TABS: 600.0000 mg | ORAL_TABLET | Freq: Four times a day (QID) | ORAL | 0 refills | Status: DC | PRN

## 2022-02-08 NOTE — ED Triage Notes (Signed)
Patient presents to Urgent Care with complaints of chest pain located on her right side of chest area that radiates to her right arm. She states this started at 0900 this morning.   Denies SOB or n/v.

## 2022-02-08 NOTE — ED Provider Notes (Signed)
[facility]    CSN: [id number] Arrival date & time: 02/08/22  1216      History   Chief Complaint Chief Complaint  Patient presents with   Arm Pain   Chest Pain    HPI Emily Eaton is a 46 y.o. female.   HPI  46 year old female here for evaluation of chest pain.  Patient reports that she developed pain in her right chest wall that radiated to her right shoulder, right back, and down her right arm at about 9 AM this morning.  This is not associated with any shortness of breath, sweating, nausea, or dizziness.  She states that the pain increases with movement and deep breathing.  Patient reports that she has recently increased her physical activity and she also typically carries her purse on her right shoulder.  Past Medical History:  Diagnosis Date   Acute low back pain    ADD (attention deficit disorder) without hyperactivity    Anemia    Chronic constipation    COVID-19    Herpes simplex without complication    History of ovarian cyst 04/2010   Internal hemorrhoids    Iron deficiency anemia due to chronic blood loss    PMS (premenstrual syndrome)    Vitamin D deficiency     Patient Active Problem List   Diagnosis Date Noted   History of COVID-19 10/2019   GERD (gastroesophageal reflux disease) 09/11/2019   Pre-diabetes 07/25/2018   Dyslipidemia 07/25/2018   Mild major depression ([place]) 07/24/2018   Tension headache 07/24/2018   Stiffness of right hand joint 07/28/2015   ADD (attention deficit hyperactivity disorder, inattentive type) 07/25/2015   Chronic constipation 07/25/2015   Herpes 07/25/2015   Hemorrhoids, internal 07/25/2015   Obesity (BMI 30-39.9) 07/25/2015   Vitamin D deficiency 07/25/2015   Radiculitis of right cervical region 04/14/2015   Epicondylitis elbow, medial 10/02/2013   Entrapment of ulnar nerve 10/02/2013   Impingement syndrome of shoulder 10/02/2013   Ovarian cyst 11/15/2011    Past Surgical History:  Procedure  Laterality Date   ENDOMETRIAL BIOPSY     NOVASURE ABLATION     OVARIAN CYST REMOVAL     left   TUBAL LIGATION     WISDOM TOOTH EXTRACTION     x 1    OB History     Gravida  2   Para      Term      Preterm      AB      Living  2      SAB      IAB      Ectopic      Multiple      Live Births  2            Home Medications    Prior to Admission medications   Medication Sig Start Date End Date Taking? Authorizing Provider  baclofen (LIORESAL) 10 MG tablet Take 1 tablet (10 mg total) by mouth 3 (three) times daily. 02/08/22  Yes Margarette Canada, NP  ibuprofen (ADVIL) 600 MG tablet Take 1 tablet (600 mg total) by mouth every 6 (six) hours as needed. 02/08/22  Yes Margarette Canada, NP  buPROPion (WELLBUTRIN XL) 150 MG 24 hr tablet Take 1 tablet (150 mg total) by mouth in the morning. 11/24/21   Steele Sizer, MD  Cholecalciferol (VITAMIN D) 50 MCG (2000 UT) CAPS Take 1 capsule (2,000 Units total) by mouth daily. 11/29/18   Steele Sizer, MD  metroNIDAZOLE (METROGEL  VAGINAL) 0.75 % vaginal gel Place 1 Applicatorful vaginally at bedtime. After initial therapy ( 7 days )  use twice a week for 5 months 11/24/21   Steele Sizer, MD  ondansetron (ZOFRAN) 8 MG tablet Take 8 mg by mouth every 8 (eight) hours as needed. 12/31/21   [provider]  pantoprazole (PROTONIX) 40 MG tablet Take 1 tablet by mouth once daily 04/17/21   Steele Sizer, MD  vitamin C (VITAMIN C) 500 MG tablet Take 1 tablet (500 mg total) by mouth daily. 11/18/19   Max Sane, MD  vitamin E 1000 UNIT capsule Take 1,000 Units by mouth daily.    [provider]    Family History Family History  Problem Relation Age of Onset   Diabetes Mother    Depression Mother    Hypertension Mother    Kidney disease Mother    Hyperlipidemia Mother    Dementia Mother    Stroke Mother        March 18, 2018 (currently in rehab)    Hypertension Father    Diabetes Father    Liver disease Father     Prostate cancer Father    Asthma Brother    Cancer Maternal Uncle        prostate   Thrombocytopenia Son        ITP   ADD / ADHD Son     Social History Social History   Tobacco Use   Smoking status: Never   Smokeless tobacco: Never  Vaping Use   Vaping Use: Never used  Substance Use Topics   Alcohol use: Yes    Alcohol/week: 2.0 standard drinks    Types: 2 Standard drinks or equivalent per week    Comment: occassionally   Drug use: No     Allergies   Codeine   Review of Systems Review of Systems  Constitutional:  Negative for diaphoresis.  Respiratory:  Negative for shortness of breath and wheezing.   Cardiovascular:  Positive for chest pain.  Gastrointestinal:  Negative for nausea.  Musculoskeletal:  Positive for back pain.  Neurological:  Negative for dizziness, weakness and numbness.    Physical Exam Triage Vital Signs ED Triage Vitals  Enc Vitals Group     BP 02/08/22 1309 (!) 153/95     Pulse Rate 02/08/22 1309 75     Resp 02/08/22 1309 16     Temp 02/08/22 1309 98.1 F (36.7 C)     Temp Source 02/08/22 1309 Oral     SpO2 02/08/22 1309 100 %     Weight --      Height --      Head Circumference --      Peak Flow --      Pain Score 02/08/22 1308 6     Pain Loc --      Pain Edu? --      Excl. in [place]? --    No data found.  Updated Vital Signs BP (!) 153/95 (BP Location: Left Arm)    Pulse 75    Temp 98.1 F (36.7 C) (Oral)    Resp 16    SpO2 100%   Visual Acuity Right Eye Distance:   Left Eye Distance:   Bilateral Distance:    Right Eye Near:   Left Eye Near:    Bilateral Near:     Physical Exam Vitals and nursing note reviewed.  Constitutional:      Appearance: Normal appearance. She is not ill-appearing.  HENT:  Head: Normocephalic and atraumatic.  Cardiovascular:     Rate and Rhythm: Normal rate and regular rhythm.     Pulses: Normal pulses.     Heart sounds: Normal heart sounds. No murmur heard.   No friction rub. No gallop.   Pulmonary:     Effort: Pulmonary effort is normal.     Breath sounds: Normal breath sounds. No wheezing, rhonchi or rales.  Chest:     Chest wall: Tenderness present.  Musculoskeletal:        General: Tenderness present. No swelling, deformity or signs of injury. Normal range of motion.  Skin:    General: Skin is warm.     Capillary Refill: Capillary refill takes less than 2 seconds.     Findings: No erythema or rash.  Neurological:     General: No focal deficit present.     Mental Status: She is alert and oriented to person, place, and time.     Sensory: No sensory deficit.     Motor: No weakness.  Psychiatric:        Mood and Affect: Mood normal.        Behavior: Behavior normal.        Thought Content: Thought content normal.        Judgment: Judgment normal.     UC Treatments / Results  Labs (all labs ordered are listed, but only abnormal results are displayed) Labs Reviewed - No data to display  EKG Normal sinus rhythm with a ventricular rate of 66 bpm PR interval 156 ms QRS duration 96 ms QT/QTc 400/419 ms No ST or T wave abnormalities   Radiology No results found.  Procedures Procedures (including critical care time)  Medications Ordered in UC Medications - No data to display  Initial Impression / Assessment and Plan / UC Course  I have reviewed the triage vital signs and the nursing notes.  Pertinent labs & imaging results that were available during my care of the patient were reviewed by me and considered in my medical decision making (see chart for details).  Very pleasant, nontoxic-appearing 46 year old female here for evaluation of right-sided chest wall pain that radiates into her right back and right shoulder.  The pain increases with movement and is not associated with nausea, dizziness, sweating, or shortness of breath.  Patient works at the pain is reproducible with palpation of the chest wall.  She states that she has not had anything similar to  this though she was recently evaluated on 01/23/2022 at an urgent care for right shoulder pain and right upper back pain.  On exam patient has a normal axial carriage.  Her heart sounds are S1-S2 and regular rate and rhythm.  Lung sounds are clear to auscultation in all fields.  Patient does have tenderness when palpating along the costosternal joints on the left side of her rib cage as well as palpating the pectoralis major muscle out to the shoulder girdle.  She has similar tenderness when palpating the trapezius on the right-hand side posteriorly.  Patient's right grip is 5/5 in her right upper extremity strength is 5/5.  Suspect patient's pain is musculoskeletal in nature.  It may be an extension of the muscle spasm she was experiencing in her shoulder earlier in the month.  I will treat her with ibuprofen, baclofen, home physical therapy, and moist heat.  I have also suggested that she switch carrying her purse to her left shoulder as to give her right shoulder a break.  EKG  was collected at triage and shows normal sinus rhythm without any ST or T wave abnormalities.  There are no changes when comparing EKG from 11/14/2019.   Final Clinical Impressions(s) / UC Diagnoses   Final diagnoses:  Chest wall pain  Thoracic myofascial strain, initial encounter     Discharge Instructions      Take the ibuprofen, 600 mg every 6 hours with food, on a schedule for the next 48 hours and then as needed.  Take the baclofen, 10 mg every 8 hours, on a schedule for the next 48 hours and then as needed.  Apply moist heat to your back for 30 minutes at a time 2-3 times a day to improve blood flow to the area and help remove the lactic acid causing the spasm.  Follow the shoulder exercises given at discharge.  Return for reevaluation for any new or worsening symptoms.      ED Prescriptions     Medication Sig Dispense Auth. Provider   ibuprofen (ADVIL) 600 MG tablet Take 1 tablet (600 mg total) by mouth  every 6 (six) hours as needed. 30 tablet Margarette Canada, NP   baclofen (LIORESAL) 10 MG tablet Take 1 tablet (10 mg total) by mouth 3 (three) times daily. 54 each Margarette Canada, NP      PDMP not reviewed this encounter.   Margarette Canada, NP 02/08/22 1350

## 2022-02-08 NOTE — Discharge Instructions (Signed)
Take the ibuprofen, 600 mg every 6 hours with food, on a schedule for the next 48 hours and then as needed.  Take the baclofen, 10 mg every 8 hours, on a schedule for the next 48 hours and then as needed.  Apply moist heat to your back for 30 minutes at a time 2-3 times a day to improve blood flow to the area and help remove the lactic acid causing the spasm.  Follow the shoulder exercises given at discharge.  Return for reevaluation for any new or worsening symptoms.

## 2022-07-12 ENCOUNTER — Encounter: Payer: Self-pay | Admitting: Nurse Practitioner

## 2022-07-12 ENCOUNTER — Ambulatory Visit (INDEPENDENT_AMBULATORY_CARE_PROVIDER_SITE_OTHER): Payer: 59 | Admitting: Nurse Practitioner

## 2022-07-12 ENCOUNTER — Other Ambulatory Visit: Payer: Self-pay

## 2022-07-12 VITALS — BP 122/74 | HR 79 | Temp 98.4°F | Resp 18 | Wt 225.2 lb

## 2022-07-12 DIAGNOSIS — Z1231 Encounter for screening mammogram for malignant neoplasm of breast: Secondary | ICD-10-CM

## 2022-07-12 DIAGNOSIS — R7303 Prediabetes: Secondary | ICD-10-CM

## 2022-07-12 DIAGNOSIS — Z0000 Encounter for general adult medical examination without abnormal findings: Secondary | ICD-10-CM | POA: Diagnosis not present

## 2022-07-12 DIAGNOSIS — Z862 Personal history of diseases of the blood and blood-forming organs and certain disorders involving the immune mechanism: Secondary | ICD-10-CM | POA: Diagnosis not present

## 2022-07-12 DIAGNOSIS — E785 Hyperlipidemia, unspecified: Secondary | ICD-10-CM

## 2022-07-12 DIAGNOSIS — Z114 Encounter for screening for human immunodeficiency virus [HIV]: Secondary | ICD-10-CM

## 2022-07-12 DIAGNOSIS — Z1211 Encounter for screening for malignant neoplasm of colon: Secondary | ICD-10-CM

## 2022-07-12 DIAGNOSIS — Z1159 Encounter for screening for other viral diseases: Secondary | ICD-10-CM

## 2022-07-12 DIAGNOSIS — F339 Major depressive disorder, recurrent, unspecified: Secondary | ICD-10-CM

## 2022-07-12 MED ORDER — BUPROPION HCL ER (XL) 150 MG PO TB24: 150.0000 mg | ORAL_TABLET | Freq: Every morning | ORAL | 1 refills | Status: DC

## 2022-07-12 NOTE — Progress Notes (Signed)
Name: Emily Eaton   MRN: [id number]    DOB: [birth year]   Date:07/12/2022       Progress Note  Subjective  Chief Complaint  Chief Complaint  Patient presents with   Annual Exam    HPI  Patient presents for annual CPE.  Diet: She says that she does eat junk food and fast food, she is going to work on eating healthier Exercise: walking, she gets about 7000 steps a day.   Sleep:she is getting about 4 hours of sleep. Patient says she is going to start retaking melatonin.  [place] Office Visit from 07/12/2022 in [facility]  AUDIT-C Score 1      Depression: Phq 9 is  positive    07/12/2022    2:09 PM 12/28/2021    2:59 PM 11/24/2021    3:26 PM 09/02/2021    3:14 PM 08/06/2021    3:19 PM  Depression screen PHQ 2/9  Decreased Interest 1 0 1 0 0  Down, Depressed, Hopeless 1 0 1 0 0  PHQ - 2 Score 2 0 2 0 0  Altered sleeping 1 0 1    Tired, decreased energy 1 0 2    Change in appetite 1 0 2    Feeling bad or failure about yourself  0 0 0    Trouble concentrating 0 0 1    Moving slowly or fidgety/restless 0 0 0    Suicidal thoughts 0 0 0    PHQ-9 Score 5 0 8    Difficult doing work/chores Somewhat difficult Not difficult at all Not difficult at all     Hypertension: BP Readings from Last 3 Encounters:  07/12/22 122/74  02/08/22 (!) 153/95  12/28/21 132/78   Obesity: Wt Readings from Last 3 Encounters:  07/12/22 225 lb 3.2 oz (102.2 kg)  12/28/21 227 lb 4.8 oz (103.1 kg)  11/24/21 225 lb 3.2 oz (102.2 kg)   BMI Readings from Last 3 Encounters:  07/12/22 36.35 kg/m  12/28/21 36.69 kg/m  11/24/21 36.35 kg/m     Vaccines:  HPV: up to at age 16 , ask insurance if age between 22-45  Shingrix: 7-64 yo and ask insurance if covered when patient above 5 yo Pneumonia:  educated and discussed with patient. Flu:  educated and discussed with patient.  Hep C Screening: 03/24/2021 STD testing and prevention (HIV/chl/gon/syphilis):  12/28/2021 Intimate partner violence:none Sexual History :not lately, she has in the past Menstrual History/LMP/Abnormal Bleeding: [facility]: she is having spotting but not consistently Incontinence Symptoms: none  Breast cancer:  - Last Mammogram: 08/15/2017, due ordered - BRCA gene screening: none  Osteoporosis: Discussed high calcium and vitamin D supplementation, weight bearing exercises  Cervical cancer screening: 09/11/2019  Skin cancer: Discussed monitoring for atypical lesions  Colorectal cancer: due, order placed Lung cancer:   Low Dose CT Chest recommended if Age 41-80 years, 20 pack-year currently smoking OR have quit w/in 15years. Patient does not qualify.   ECG: 02/08/2022  Advanced Care Planning: A voluntary discussion about advance care planning including the explanation and discussion of advance directives.  Discussed health care proxy and Living will, and the patient was able to identify a health care proxy as ex husband, Dorthula Rue.  Patient does not have a living will at present time. If patient does have living will, I have requested they bring this to the clinic to be scanned in to their chart.  Lipids: Lab Results  Component Value Date   CHOL 184  05/18/2021   CHOL 166 09/11/2019   CHOL 191 07/24/2018   Lab Results  Component Value Date   HDL 50 05/18/2021   HDL 41 (L) 09/11/2019   HDL 44 (L) 07/24/2018   Lab Results  Component Value Date   LDLCALC 120 (H) 05/18/2021   LDLCALC 109 (H) 09/11/2019   LDLCALC 134 (H) 07/24/2018   Lab Results  Component Value Date   TRIG 50 05/18/2021   TRIG 74 09/11/2019   TRIG 43 07/24/2018   Lab Results  Component Value Date   CHOLHDL 3.7 05/18/2021   CHOLHDL 4.0 09/11/2019   CHOLHDL 4.3 07/24/2018   No results found for: "LDLDIRECT"  Glucose: Glucose, Bld  Date Value Ref Range Status  05/18/2021 78 65 - 99 mg/dL Final    Comment:    .            Fasting reference interval .   03/24/2020 92 65 - 99 mg/dL Final     Comment:    .            Fasting reference interval .   11/18/2019 94 70 - 99 mg/dL Final   Glucose-Capillary  Date Value Ref Range Status  11/15/2019 147 (H) 70 - 99 mg/dL Final    Patient Active Problem List   Diagnosis Date Noted   History of COVID-19 10/2019   GERD (gastroesophageal reflux disease) 09/11/2019   Pre-diabetes 07/25/2018   Dyslipidemia 07/25/2018   Mild major depression ([place]) 07/24/2018   Tension headache 07/24/2018   Stiffness of right hand joint 07/28/2015   ADD (attention deficit hyperactivity disorder, inattentive type) 07/25/2015   Chronic constipation 07/25/2015   Herpes 07/25/2015   Hemorrhoids, internal 07/25/2015   Obesity (BMI 30-39.9) 07/25/2015   Vitamin D deficiency 07/25/2015   Radiculitis of right cervical region 04/14/2015   Epicondylitis elbow, medial 10/02/2013   Entrapment of ulnar nerve 10/02/2013   Impingement syndrome of shoulder 10/02/2013   Ovarian cyst 11/15/2011    Past Surgical History:  Procedure Laterality Date   ENDOMETRIAL BIOPSY     NOVASURE ABLATION     OVARIAN CYST REMOVAL     left   TUBAL LIGATION     WISDOM TOOTH EXTRACTION     x 1    Family History  Problem Relation Age of Onset   Diabetes Mother    Depression Mother    Hypertension Mother    Kidney disease Mother    Hyperlipidemia Mother    Dementia Mother    Stroke Mother        March 18, 2018 (currently in rehab)    Hypertension Father    Diabetes Father    Liver disease Father    Prostate cancer Father    Asthma Brother    Cancer Maternal Uncle        prostate   Thrombocytopenia Son        ITP   ADD / ADHD Son     Social History   Socioeconomic History   Marital status: Divorced    Spouse name: Dorthula Rue   Number of children: 2   Years of education: 14   Highest education level: Associate degree: occupational, Hotel manager, or vocational program  Occupational History   Occupation: Pharmacist, hospital    Comment: [employer]   Occupation: Glass blower/designer: [employer]  Tobacco Use   Smoking status: Never   Smokeless tobacco: Never  Vaping Use   Vaping Use: Never used  Substance and  Sexual Activity   Alcohol use: Yes    Alcohol/week: 2.0 standard drinks of alcohol    Types: 2 Standard drinks or equivalent per week    Comment: occassionally   Drug use: No   Sexual activity: Yes    Partners: Male    Birth control/protection: Condom, Surgical    Comment: Tubaligation  Other Topics Concern   Not on file  Social History Narrative   Divorced July 5th, 2019, but they are still sexually active   Son is in the [employer] going to [place]  from 07/2018 to 06/2019, he is at home right now, he will return to his job end of the year   Youngest son is at home   Mother in [place] after stroke in 2019   Father diagnosed with prostate cancer 04/2019   Social Determinants of Health   Financial Resource Strain: Low Risk  (07/12/2022)   Overall Financial Resource Strain (CARDIA)    Difficulty of Paying Living Expenses: Not hard at all  Food Insecurity: No Food Insecurity (07/12/2022)   Hunger Vital Sign    Worried About [place] in the Last Year: Never true    [place] in the Last Year: Never true  Transportation Needs: No Transportation Needs (07/12/2022)   PRAPARE - Hydrologist (Medical): No    Lack of Transportation (Non-Medical): No  Physical Activity: Insufficiently Active (07/12/2022)   Exercise Vital Sign    Days of Exercise per Week: 3 days    Minutes of Exercise per Session: 30 min  Stress: No Stress Concern Present (07/12/2022)   [place]    Feeling of Stress : Only a little  Social Connections: Moderately Isolated (07/12/2022)   Social Connection and Isolation Panel [NHANES]    Frequency of Communication with Friends and Family: More than three times a week    Frequency of Social Gatherings with  Friends and Family: More than three times a week    Attends Religious Services: 1 to 4 times per year    Active Member of [employer] or Organizations: No    Attends Archivist Meetings: Never    Marital Status: Divorced  Human resources officer Violence: Not At Risk (07/12/2022)   Humiliation, Afraid, Rape, and Kick questionnaire    Fear of Current or Ex-Partner: No    Emotionally Abused: No    Physically Abused: No    Sexually Abused: No     Current Outpatient Medications:    baclofen (LIORESAL) 10 MG tablet, Take 1 tablet (10 mg total) by mouth 3 (three) times daily., Disp: 30 each, Rfl: 0   buPROPion (WELLBUTRIN XL) 150 MG 24 hr tablet, Take 1 tablet (150 mg total) by mouth in the morning., Disp: 90 tablet, Rfl: 1   Cholecalciferol (VITAMIN D) 50 MCG (2000 UT) CAPS, Take 1 capsule (2,000 Units total) by mouth daily., Disp: 30 capsule, Rfl: 0   ibuprofen (ADVIL) 600 MG tablet, Take 1 tablet (600 mg total) by mouth every 6 (six) hours as needed., Disp: 30 tablet, Rfl: 0   ondansetron (ZOFRAN) 8 MG tablet, Take 8 mg by mouth every 8 (eight) hours as needed., Disp: , Rfl:    pantoprazole (PROTONIX) 40 MG tablet, Take 1 tablet by mouth once daily, Disp: 90 tablet, Rfl: 0   vitamin C (VITAMIN C) 500 MG tablet, Take 1 tablet (500 mg total) by mouth daily., Disp: 30 tablet,  Rfl: 0   vitamin E 1000 UNIT capsule, Take 1,000 Units by mouth daily., Disp: , Rfl:    metroNIDAZOLE (METROGEL VAGINAL) 0.75 % vaginal gel, Place 1 Applicatorful vaginally at bedtime. After initial therapy ( 7 days )  use twice a week for 5 months (Patient not taking: Reported on 07/12/2022), Disp: 70 g, Rfl: 5  Allergies  Allergen Reactions   Codeine Itching and Rash     ROS  Constitutional: Negative for fever or weight change.  Respiratory: Negative for cough and shortness of breath.   Cardiovascular: Negative for chest pain or palpitations.  Gastrointestinal: Negative for abdominal pain, no bowel changes.   Musculoskeletal: Negative for gait problem or joint swelling.  Skin: Negative for rash.  Neurological: Negative for dizziness or headache.  No other specific complaints in a complete review of systems (except as listed in HPI above).   Objective  Vitals:   07/12/22 1404  BP: 122/74  Pulse: 79  Resp: 18  Temp: 98.4 F (36.9 C)  TempSrc: Oral  SpO2: 98%  Weight: 225 lb 3.2 oz (102.2 kg)    Body mass index is 36.35 kg/m.  Physical Exam Constitutional: Patient appears well-developed and well-nourished. No distress.  HENT: Head: Normocephalic and atraumatic. Ears: B TMs ok, no erythema or effusion; Nose: Nose normal. Mouth/Throat: Oropharynx is clear and moist. No oropharyngeal exudate.  Eyes: Conjunctivae and EOM are normal. Pupils are equal, round, and reactive to light. No scleral icterus.  Neck: Normal range of motion. Neck supple. No JVD present. No thyromegaly present.  Cardiovascular: Normal rate, regular rhythm and normal heart sounds.  No murmur heard. No BLE edema. Pulmonary/Chest: Effort normal and breath sounds normal. No respiratory distress. Abdominal: Soft. Bowel sounds are normal, no distension. There is no tenderness. no masses Breast: no lumps or masses, no nipple discharge or rashes Musculoskeletal: Normal range of motion, no joint effusions. No gross deformities Neurological: he is alert and oriented to person, place, and time. No cranial nerve deficit. Coordination, balance, strength, speech and gait are normal.  Skin: Skin is warm and dry. No rash noted. No erythema.  Psychiatric: Patient has a normal mood and affect. behavior is normal. Judgment and thought content normal.   No results found for this or any previous visit (from the past 2160 hour(s)).    Fall Risk:    07/12/2022    2:09 PM 12/28/2021    2:58 PM 11/24/2021    3:22 PM 09/02/2021    3:14 PM 08/06/2021    3:07 PM  Fall Risk   Falls in the past year? 0 0 0 0 0  Number falls in past yr: 0 0   0 0  Injury with Fall? 0 0  0 0  Risk for fall due to :  No Fall Risks     Follow up Falls evaluation completed Falls prevention discussed Falls prevention discussed Falls evaluation completed      Functional Status Survey: Is the patient deaf or have difficulty hearing?: No Does the patient have difficulty seeing, even when wearing glasses/contacts?: No Does the patient have difficulty concentrating, remembering, or making decisions?: No Does the patient have difficulty walking or climbing stairs?: No Does the patient have difficulty dressing or bathing?: No Does the patient have difficulty doing errands alone such as visiting a doctor's office or shopping?: No   Assessment & Plan  1. Annual physical exam  - Ambulatory referral to Gastroenterology - MM 3D SCREEN BREAST BILATERAL; Future - Lipid panel -  COMPLETE METABOLIC PANEL WITH GFR - CBC with Differential/Platelet - Hemoglobin A1c - Hepatitis C antibody - HIV Antibody (routine testing w rflx)  2. Pre-diabetes  - COMPLETE METABOLIC PANEL WITH GFR - Hemoglobin A1c  3. Dyslipidemia  - Lipid panel  4. History of anemia  - CBC with Differential/Platelet  5. Screening for colon cancer  - Ambulatory referral to Gastroenterology  6. Encounter for screening mammogram for malignant neoplasm of breast  - MM 3D SCREEN BREAST BILATERAL; Future  7. Encounter for screening for HIV  - HIV Antibody (routine testing w rflx)  8. Encounter for hepatitis C screening test for low risk patient  - Hepatitis C antibody  9. Major depression, recurrent, chronic (HCC) Patient requested refill - buPROPion (WELLBUTRIN XL) 150 MG 24 hr tablet; Take 1 tablet (150 mg total) by mouth in the morning.  Dispense: 90 tablet; Refill: 1   -USPSTF grade A and B recommendations reviewed with patient; age-appropriate recommendations, preventive care, screening tests, etc discussed and encouraged; healthy living encouraged; see AVS for patient  education given to patient -Discussed importance of 150 minutes of physical activity weekly, eat two servings of fish weekly, eat one serving of tree nuts ( cashews, pistachios, pecans, almonds.Marland Kitchen) every other day, eat 6 servings of fruit/vegetables daily and drink plenty of water and avoid sweet beverages.

## 2022-07-13 LAB — CBC WITH DIFFERENTIAL/PLATELET
Absolute Monocytes: 370 cells/uL (ref 200–950)
Basophils Absolute: 8 cells/uL (ref 0–200)
Basophils Relative: 0.2 %
Eosinophils Absolute: 21 cells/uL (ref 15–500)
Eosinophils Relative: 0.5 %
HCT: 38.9 % (ref 35.0–45.0)
Hemoglobin: 12.6 g/dL (ref 11.7–15.5)
Lymphs Abs: 1890 cells/uL (ref 850–3900)
MCH: 25.9 pg — ABNORMAL LOW (ref 27.0–33.0)
MCHC: 32.4 g/dL (ref 32.0–36.0)
MCV: 79.9 fL — ABNORMAL LOW (ref 80.0–100.0)
MPV: 9.6 fL (ref 7.5–12.5)
Monocytes Relative: 8.8 %
Neutro Abs: 1911 cells/uL (ref 1500–7800)
Neutrophils Relative %: 45.5 %
Platelets: 279 10*3/uL (ref 140–400)
RBC: 4.87 10*6/uL (ref 3.80–5.10)
RDW: 12.9 % (ref 11.0–15.0)
Total Lymphocyte: 45 %
WBC: 4.2 10*3/uL (ref 3.8–10.8)

## 2022-07-13 LAB — COMPLETE METABOLIC PANEL WITH GFR
AG Ratio: 1.1 (calc) (ref 1.0–2.5)
ALT: 7 U/L (ref 6–29)
AST: 9 U/L — ABNORMAL LOW (ref 10–35)
Albumin: 4 g/dL (ref 3.6–5.1)
Alkaline phosphatase (APISO): 55 U/L (ref 31–125)
BUN: 9 mg/dL (ref 7–25)
CO2: 26 mmol/L (ref 20–32)
Calcium: 9.5 mg/dL (ref 8.6–10.2)
Chloride: 104 mmol/L (ref 98–110)
Creat: 0.71 mg/dL (ref 0.50–0.99)
Globulin: 3.5 g/dL (calc) (ref 1.9–3.7)
Glucose, Bld: 75 mg/dL (ref 65–99)
Potassium: 4.2 mmol/L (ref 3.5–5.3)
Sodium: 138 mmol/L (ref 135–146)
Total Bilirubin: 0.4 mg/dL (ref 0.2–1.2)
Total Protein: 7.5 g/dL (ref 6.1–8.1)
eGFR: 105 mL/min/{1.73_m2} (ref >=60)

## 2022-07-13 LAB — LIPID PANEL
Cholesterol: 199 mg/dL (ref <200)
HDL: 51 mg/dL (ref >=50)
LDL Cholesterol (Calc): 133 mg/dL (calc) — ABNORMAL HIGH
Non-HDL Cholesterol (Calc): 148 mg/dL (calc) — ABNORMAL HIGH (ref <130)
Total CHOL/HDL Ratio: 3.9 (calc) (ref <5.0)
Triglycerides: 49 mg/dL (ref <150)

## 2022-07-13 LAB — HEMOGLOBIN A1C
Hgb A1c MFr Bld: 5.6 % of total Hgb (ref <5.7)
Mean Plasma Glucose: 114 mg/dL
eAG (mmol/L): 6.3 mmol/L

## 2022-07-13 LAB — HIV ANTIBODY (ROUTINE TESTING W REFLEX): HIV 1&2 Ab, 4th Generation: NONREACTIVE

## 2022-07-13 LAB — HEPATITIS C ANTIBODY: Hepatitis C Ab: NONREACTIVE

## 2022-09-14 ENCOUNTER — Ambulatory Visit
Admission: RE | Admit: 2022-09-14 | Discharge: 2022-09-14 | Disposition: A | Discharge status: Home / Self Care | Payer: 59 | Source: Ambulatory Visit | Attending: Nurse Practitioner | Admitting: Nurse Practitioner

## 2022-09-14 DIAGNOSIS — Z1231 Encounter for screening mammogram for malignant neoplasm of breast: Secondary | ICD-10-CM | POA: Insufficient documentation

## 2022-09-14 DIAGNOSIS — Z0000 Encounter for general adult medical examination without abnormal findings: Secondary | ICD-10-CM

## 2022-09-27 ENCOUNTER — Encounter: Payer: Self-pay | Admitting: Family Medicine

## 2022-09-27 ENCOUNTER — Ambulatory Visit: Payer: 59 | Admitting: Family Medicine

## 2022-09-27 VITALS — BP 136/80 | HR 94 | Resp 16 | Ht 66.0 in | Wt 224.0 lb

## 2022-09-27 DIAGNOSIS — R7303 Prediabetes: Secondary | ICD-10-CM | POA: Diagnosis not present

## 2022-09-27 DIAGNOSIS — K219 Gastro-esophageal reflux disease without esophagitis: Secondary | ICD-10-CM

## 2022-09-27 DIAGNOSIS — B001 Herpesviral vesicular dermatitis: Secondary | ICD-10-CM

## 2022-09-27 DIAGNOSIS — E785 Hyperlipidemia, unspecified: Secondary | ICD-10-CM

## 2022-09-27 DIAGNOSIS — Z8616 Personal history of COVID-19: Secondary | ICD-10-CM | POA: Diagnosis not present

## 2022-09-27 DIAGNOSIS — N760 Acute vaginitis: Secondary | ICD-10-CM

## 2022-09-27 DIAGNOSIS — B9689 Other specified bacterial agents as the cause of diseases classified elsewhere: Secondary | ICD-10-CM

## 2022-09-27 MED ORDER — PANTOPRAZOLE SODIUM 40 MG PO TBEC: 40.0000 mg | DELAYED_RELEASE_TABLET | Freq: Every day | ORAL | 0 refills | Status: DC

## 2022-09-27 MED ORDER — METRONIDAZOLE 0.75 % VA GEL: 1.0000 | Freq: Every day | VAGINAL | 1 refills | Status: DC

## 2022-09-27 MED ORDER — VALACYCLOVIR HCL 1 G PO TABS: 1000.0000 mg | ORAL_TABLET | Freq: Two times a day (BID) | ORAL | 0 refills | Status: AC

## 2022-09-27 NOTE — Progress Notes (Signed)
Name: Emily Eaton   MRN: [id number]    DOB: 1976-02-19   Date:09/27/2022       Progress Note  Subjective  Chief Complaint  FMLA Paperwork  HPI  She was diagnosed with COVID-19 on 09/06/2022 at [facility]. She developed congestion, cough, sore throat , headache and did a home covid test that was positive on 09/15, the following day she went to [facility] and had a confirmatory COVID-19 test done and advised to stay home for 5 days. She has a part time job at [employer] and must have [place] forms filled out. She has been back to work but needs papers to be able to keep her job. [facility] does not fill out forms.   Herpes simplex: on right lower lip, tender, started this am, she used to have valtrex at home but needs a refill today, no fever, malaise.  Stress: mother had a stroke back in 2018 and has paralyzed, living in a nursing home and getting fed through NG tube, lately developed CHF , hospice is involved but she had to make decision with her brother to change her to DNR.  Dyslipidemia: she is on diet only, risk is low, but needs to stop eating out as often   The 10-year ASCVD risk score (Arnett DK, et al., 2019) is: 2.2%   Values used to calculate the score:     Age: 46 years     Sex: Female     Is Non-Hispanic African American: Yes     Diabetic: No     Tobacco smoker: No     Systolic Blood Pressure: 474 mmHg     Is BP treated: No     HDL Cholesterol: 51 mg/dL     Total Cholesterol: 199 mg/dL   Prediabetes: doing better, last A1C at goal, no polyphagia, polydipsia or polyuria   GERD: taking PPI prn and symptoms are controlled, needs a refill  BV: recurrent, usually around her cycles, would like a refill of topical medication   Patient Active Problem List   Diagnosis Date Noted   History of COVID-19 10/2019   GERD (gastroesophageal reflux disease) 09/11/2019   Pre-diabetes 07/25/2018   Dyslipidemia 07/25/2018   Mild major depression ([place]) 07/24/2018    Tension headache 07/24/2018   Stiffness of right hand joint 07/28/2015   ADD (attention deficit hyperactivity disorder, inattentive type) 07/25/2015   Chronic constipation 07/25/2015   Herpes 07/25/2015   Hemorrhoids, internal 07/25/2015   Obesity (BMI 30-39.9) 07/25/2015   Vitamin D deficiency 07/25/2015   Radiculitis of right cervical region 04/14/2015   Epicondylitis elbow, medial 10/02/2013   Entrapment of ulnar nerve 10/02/2013   Impingement syndrome of shoulder 10/02/2013   Ovarian cyst 11/15/2011    Past Surgical History:  Procedure Laterality Date   ENDOMETRIAL BIOPSY     NOVASURE ABLATION     OVARIAN CYST REMOVAL     left   TUBAL LIGATION     WISDOM TOOTH EXTRACTION     x 1    Family History  Problem Relation Age of Onset   Diabetes Mother    Depression Mother    Hypertension Mother    Kidney disease Mother    Hyperlipidemia Mother    Dementia Mother    Stroke Mother        March 18, 2018 (currently in rehab)    Hypertension Father    Diabetes Father    Liver disease Father    Prostate cancer Father  Cancer Maternal Uncle        prostate   Asthma Brother    Thrombocytopenia Son        ITP   ADD / ADHD Son    Breast cancer Neg Hx     Social History   Tobacco Use   Smoking status: Never   Smokeless tobacco: Never  Substance Use Topics   Alcohol use: Yes    Alcohol/week: 2.0 standard drinks of alcohol    Types: 2 Standard drinks or equivalent per week    Comment: occassionally     Current Outpatient Medications:    buPROPion (WELLBUTRIN XL) 150 MG 24 hr tablet, Take 1 tablet (150 mg total) by mouth in the morning., Disp: 90 tablet, Rfl: 1   Cholecalciferol (VITAMIN D) 50 MCG (2000 UT) CAPS, Take 1 capsule (2,000 Units total) by mouth daily., Disp: 30 capsule, Rfl: 0   ibuprofen (ADVIL) 600 MG tablet, Take 1 tablet (600 mg total) by mouth every 6 (six) hours as needed., Disp: 30 tablet, Rfl: 0   metroNIDAZOLE (METROGEL VAGINAL) 0.75 % vaginal  gel, Place 1 Applicatorful vaginally at bedtime. After initial therapy ( 7 days )  use twice a week for 5 months, Disp: 70 g, Rfl: 5   pantoprazole (PROTONIX) 40 MG tablet, Take 1 tablet by mouth once daily, Disp: 90 tablet, Rfl: 0   vitamin C (VITAMIN C) 500 MG tablet, Take 1 tablet (500 mg total) by mouth daily., Disp: 30 tablet, Rfl: 0   vitamin E 1000 UNIT capsule, Take 1,000 Units by mouth daily., Disp: , Rfl:    baclofen (LIORESAL) 10 MG tablet, Take 1 tablet (10 mg total) by mouth 3 (three) times daily. (Patient not taking: Reported on 09/27/2022), Disp: 30 each, Rfl: 0   ondansetron (ZOFRAN) 8 MG tablet, Take 8 mg by mouth every 8 (eight) hours as needed. (Patient not taking: Reported on 09/27/2022), Disp: , Rfl:   Allergies  Allergen Reactions   Codeine Itching and Rash    I personally reviewed active problem list, medication list, allergies, family history, social history, health maintenance with the patient/caregiver today.   ROS  Ten systems reviewed and is negative except as mentioned in HPI   Objective  Vitals:   09/27/22 1346  BP: 136/80  Pulse: 94  Resp: 16  SpO2: 99%  Weight: 224 lb (101.6 kg)  Height: _0  (1.676 m)    Body mass index is 36.15 kg/m.  Physical Exam  Constitutional: Patient appears well-developed and well-nourished. Obese  No distress.  HEENT: head atraumatic, normocephalic, pupils equal and reactive to light, neck supple, herpes labialis right lower lip  Cardiovascular: Normal rate, regular rhythm and normal heart sounds.  No murmur heard. No BLE edema. Pulmonary/Chest: Effort normal and breath sounds normal. No respiratory distress. Abdominal: Soft.  There is no tenderness. Psychiatric: Patient has a normal mood and affect. behavior is normal. Judgment and thought content normal.    Recent Results (from the past 2160 hour(s))  Lipid panel     Status: Abnormal   Collection Time: 07/12/22  2:50 PM  Result Value Ref Range   Cholesterol  199 <200 mg/dL   HDL 51 > OR = 50 mg/dL   Triglycerides 49 <150 mg/dL   LDL Cholesterol (Calc) 133 (H) mg/dL (calc)    Comment: Reference range: <100 . Desirable range <100 mg/dL for primary prevention;   <70 mg/dL for patients with CHD or diabetic patients  with > or = 2 CHD  risk factors. Marland Kitchen LDL-C is now calculated using the Martin-Hopkins  calculation, which is a validated novel method providing  better accuracy than the Friedewald equation in the  estimation of LDL-C.  Cresenciano Genre et al. Annamaria Helling. 0223;361(22): 2061-2068  (http://education.[facility].com/faq/FAQ164)    Total CHOL/HDL Ratio 3.9 <5.0 (calc)   Non-HDL Cholesterol (Calc) 148 (H) <130 mg/dL (calc)    Comment: For patients with diabetes plus 1 major ASCVD risk  factor, treating to a non-HDL-C goal of <100 mg/dL  (LDL-C of <70 mg/dL) is considered a therapeutic  option.   COMPLETE METABOLIC PANEL WITH GFR     Status: Abnormal   Collection Time: 07/12/22  2:50 PM  Result Value Ref Range   Glucose, Bld 75 65 - 99 mg/dL    Comment: .            Fasting reference interval .    BUN 9 7 - 25 mg/dL   Creat 0.71 0.50 - 0.99 mg/dL   eGFR 105 > OR = 60 mL/min/1.16m    Comment: The eGFR is based on the CKD-EPI 2021 equation. To calculate  the new eGFR from a previous Creatinine or Cystatin C result, go to https://www.kidney.org/professionals/ kdoqi/gfr%5Fcalculator    BUN/Creatinine Ratio NOT APPLICABLE 6 - 22 (calc)   Sodium 138 135 - 146 mmol/L   Potassium 4.2 3.5 - 5.3 mmol/L   Chloride 104 98 - 110 mmol/L   CO2 26 20 - 32 mmol/L   Calcium 9.5 8.6 - 10.2 mg/dL   Total Protein 7.5 6.1 - 8.1 g/dL   Albumin 4.0 3.6 - 5.1 g/dL   Globulin 3.5 1.9 - 3.7 g/dL (calc)   AG Ratio 1.1 1.0 - 2.5 (calc)   Total Bilirubin 0.4 0.2 - 1.2 mg/dL   Alkaline phosphatase (APISO) 55 31 - 125 U/L   AST 9 (L) 10 - 35 U/L   ALT 7 6 - 29 U/L  CBC with Differential/Platelet     Status: Abnormal   Collection Time: 07/12/22  2:50 PM   Result Value Ref Range   WBC 4.2 3.8 - 10.8 Thousand/uL   RBC 4.87 3.80 - 5.10 Million/uL   Hemoglobin 12.6 11.7 - 15.5 g/dL   HCT 38.9 35.0 - 45.0 %   MCV 79.9 (L) 80.0 - 100.0 fL   MCH 25.9 (L) 27.0 - 33.0 pg   MCHC 32.4 32.0 - 36.0 g/dL   RDW 12.9 11.0 - 15.0 %   Platelets 279 140 - 400 Thousand/uL   MPV 9.6 7.5 - 12.5 fL   Neutro Abs 1,911 1,500 - 7,800 cells/uL   Lymphs Abs 1,890 850 - 3,900 cells/uL   Absolute Monocytes 370 200 - 950 cells/uL   Eosinophils Absolute 21 15 - 500 cells/uL   Basophils Absolute 8 0 - 200 cells/uL   Neutrophils Relative % 45.5 %   Total Lymphocyte 45.0 %   Monocytes Relative 8.8 %   Eosinophils Relative 0.5 %   Basophils Relative 0.2 %  Hemoglobin A1c     Status: None   Collection Time: 07/12/22  2:50 PM  Result Value Ref Range   Hgb A1c MFr Bld 5.6 <5.7 % of total Hgb    Comment: For the purpose of screening for the presence of diabetes: . <5.7%       Consistent with the absence of diabetes 5.7-6.4%    Consistent with increased risk for diabetes             (prediabetes) > or =6.5%  Consistent with diabetes . This assay result is consistent with a decreased risk of diabetes. . Currently, no consensus exists regarding use of hemoglobin A1c for diagnosis of diabetes in children. . According to American Diabetes Association (ADA) guidelines, hemoglobin A1c <7.0% represents optimal control in non-pregnant diabetic patients. Different metrics may apply to specific patient populations.  Standards of Medical Care in Diabetes(ADA). .    Mean Plasma Glucose 114 mg/dL   eAG (mmol/L) 6.3 mmol/L  Hepatitis C antibody     Status: None   Collection Time: 07/12/22  2:50 PM  Result Value Ref Range   Hepatitis C Ab NON-REACTIVE NON-REACTIVE    Comment: . HCV antibody was non-reactive. There is no laboratory  evidence of HCV infection. . In most cases, no further action is required. However, if recent HCV exposure is suspected, a test for  HCV RNA (test code [phone]) is suggested. . For additional information please refer to http://education.[facility].com/faq/FAQ22v1 (This link is being provided for informational/ educational purposes only.) .   HIV Antibody (routine testing w rflx)     Status: None   Collection Time: 07/12/22  2:50 PM  Result Value Ref Range   HIV 1&2 Ab, 4th Generation NON-REACTIVE NON-REACTIVE    Comment: HIV-1 antigen and HIV-1/HIV-2 antibodies were not detected. There is no laboratory evidence of HIV infection. Marland Kitchen PLEASE NOTE: This information has been disclosed to you from records whose confidentiality may be protected by state law.  If your state requires such protection, then the state law prohibits you from making any further disclosure of the information without the specific written consent of the person to whom it pertains, or as otherwise permitted by law. A general authorization for the release of medical or other information is NOT sufficient for this purpose. . For additional information please refer to http://education.[facility].com/faq/FAQ106 (This link is being provided for informational/ educational purposes only.) . Marland Kitchen The performance of this assay has not been clinically validated in patients less than 41 years old. .     PHQ2/9:    09/27/2022    1:46 PM 07/12/2022    2:09 PM 12/28/2021    2:59 PM 11/24/2021    3:26 PM 09/02/2021    3:14 PM  Depression screen PHQ 2/9  Decreased Interest 1 1 0 1 0  Down, Depressed, Hopeless 1 1 0 1 0  PHQ - 2 Score 2 2 0 2 0  Altered sleeping 0 1 0 1   Tired, decreased energy 0 1 0 2   Change in appetite 0 1 0 2   Feeling bad or failure about yourself  0 0 0 0   Trouble concentrating 0 0 0 1   Moving slowly or fidgety/restless 0 0 0 0   Suicidal thoughts 0 0 0 0   PHQ-9 Score 2 5 0 8   Difficult doing work/chores  Somewhat difficult Not difficult at all Not difficult at all     phq 9 is positive   Fall Risk:     09/27/2022    1:46 PM 07/12/2022    2:09 PM 12/28/2021    2:58 PM 11/24/2021    3:22 PM 09/02/2021    3:14 PM  Fall Risk   Falls in the past year? 0 0 0 0 0  Number falls in past yr: 0 0 0  0  Injury with Fall? 0 0 0  0  Risk for fall due to : No Fall Risks  No Fall Risks    Follow  up Falls prevention discussed Falls evaluation completed Falls prevention discussed Falls prevention discussed Falls evaluation completed      Functional Status Survey: Is the patient deaf or have difficulty hearing?: No Does the patient have difficulty seeing, even when wearing glasses/contacts?: No Does the patient have difficulty concentrating, remembering, or making decisions?: No Does the patient have difficulty walking or climbing stairs?: No Does the patient have difficulty dressing or bathing?: No Does the patient have difficulty doing errands alone such as visiting a doctor's office or shopping?: No    Assessment & Plan  1. Herpes labialis  - valACYclovir (VALTREX) 1000 MG tablet; Take 1 tablet (1,000 mg total) by mouth 2 (two) times daily for 5 days.  Dispense: 10 tablet; Refill: 0  2. History of COVID-19  Forms filled out    3. Dyslipidemia  Discussed life style modification   4. Pre-diabetes   1. Herpes labialis   5. Bacterial vaginosis  - metroNIDAZOLE (METROGEL VAGINAL) 0.75 % vaginal gel; Place 1 Applicatorful vaginally at bedtime. After initial therapy ( 7 days )  use twice a week for 5 months  Dispense: 70 g; Refill: 1  6. Gastroesophageal reflux disease without esophagitis  - pantoprazole (PROTONIX) 40 MG tablet; Take 1 tablet (40 mg total) by mouth daily.  Dispense: 90 tablet; Refill: 0

## 2022-10-27 ENCOUNTER — Telehealth: Payer: 59 | Admitting: Physician Assistant

## 2022-10-27 DIAGNOSIS — B001 Herpesviral vesicular dermatitis: Secondary | ICD-10-CM | POA: Diagnosis not present

## 2022-10-28 MED ORDER — VALACYCLOVIR HCL 1 G PO TABS: 2000.0000 mg | ORAL_TABLET | Freq: Two times a day (BID) | ORAL | 0 refills | Status: AC

## 2022-10-28 NOTE — Progress Notes (Signed)
We are sorry that you are not feeling well.  Here is how we plan to help!  Based on what you have shared with me it does look like you have a viral infection.    Most cold sores or fever blisters are small fluid filled blisters around the mouth caused by herpes simplex virus.  The most common strain of the virus causing cold sores is herpes simplex virus 1.  It can be spread by skin contact, sharing eating utensils, or even sharing towels.  Cold sores are contagious to other people until dry. (Approximately 5-7 days).  Wash your hands. You can spread the virus to your eyes through handling your contact lenses after touching the lesions.  Most people experience pain at the sight or tingling sensations in their lips that may begin before the ulcers erupt.  Herpes simplex is treatable but not curable.  It may lie dormant for a long time and then reappear due to stress or prolonged sun exposure.  Many patients have success in treating their cold sores with an over the counter topical called Abreva.  You may apply the cream up to 5 times daily (maximum 10 days) until healing occurs.  If you would like to use an oral antiviral medication to speed the healing of your cold sore, I have sent a prescription to your local pharmacy Valacyclovir 2 gm take one by mouth twice a day for 1 day    HOME CARE:  Wash your hands frequently. Do not pick at or rub the sore. Don't open the blisters. Avoid kissing other people during this time. Avoid sharing drinking glasses, eating utensils, or razors. Do not handle contact lenses unless you have thoroughly washed your hands with soap and warm water! Avoid oral sex during this time.  Herpes from sores on your mouth can spread to your partner's genital area. Avoid contact with anyone who has eczema or a weakened immune system. Cold sores are often triggered by exposure to intense sunlight, use a lip balm containing a sunscreen (SPF 30 or higher).  GET HELP RIGHT AWAY  IF:  Blisters look infected. Blisters occur near or in the eye. Symptoms last longer than 10 days. Your symptoms become worse.  MAKE SURE YOU:  Understand these instructions. Will watch your condition. Will get help right away if you are not doing well or get worse.    Your e-visit answers were reviewed by a board certified advanced clinical practitioner to complete your personal care plan.  Depending upon the condition, your plan could have  Included both over the counter or prescription medications.    Please review your pharmacy choice.  Be sure that the pharmacy you have chosen is open so that you can pick up your prescription now.  If there is a problem you can message your provider in MyChart to have the prescription routed to another pharmacy.    Your safety is important to us.  If you have drug allergies check our prescription carefully.  For the next 24 hours you can use MyChart to ask questions about today's visit, request a non-urgent call back, or ask for a work or school excuse from your e-visit provider.  You will get an email in the next two days asking about your experience.  I hope that your e-visit has been valuable and will speed your recovery.  I have spent 5 minutes in review of e-visit questionnaire, review and updating patient chart, medical decision making and response to patient.     Janiesha Diehl M Mersadie Kavanaugh, PA-C  

## 2022-10-30 ENCOUNTER — Telehealth: Payer: 59 | Admitting: Emergency Medicine

## 2022-10-30 DIAGNOSIS — J029 Acute pharyngitis, unspecified: Secondary | ICD-10-CM

## 2022-10-30 NOTE — Progress Notes (Signed)
E-Visit for Sore Throat  We are sorry that you are not feeling well.  Here is how we plan to help!  Your symptoms indicate a likely viral infection (Pharyngitis).   Pharyngitis is inflammation in the back of the throat which can cause a sore throat, scratchiness and sometimes difficulty swallowing.   Pharyngitis is typically caused by a respiratory virus and will just run its course.  Please keep in mind that your symptoms could last up to 10 days.    For throat pain, we recommend over the counter oral pain relief medications such as acetaminophen or aspirin, or anti-inflammatory medications such as ibuprofen or naproxen sodium.  Topical treatments such as oral throat lozenges or sprays may be used as needed.  Gargling with salt water can also help relieve pain and swelling from a throat infection.   Avoid close contact with loved ones, especially the very young and elderly.  Remember to wash your hands thoroughly throughout the day as this is the number one way to prevent the spread of infection and wipe down door knobs and counters with disinfectant.  After careful review of your answers, I would not recommend an antibiotic for your condition.  Antibiotics should not be used to treat conditions that we suspect are caused by viruses like the virus that causes the common cold or flu. However, some people can have Strep with atypical symptoms. You may need formal testing in clinic or office to confirm if your symptoms continue or worsen.  Providers prescribe antibiotics to treat infections caused by bacteria. Antibiotics are very powerful in treating bacterial infections when they are used properly.  To maintain their effectiveness, they should be used only when necessary.  Overuse of antibiotics has resulted in the development of super bugs that are resistant to treatment!    Home Care: Only take medications as instructed by your medical team. Do not drink alcohol while taking these medications. A  steam or ultrasonic humidifier can help congestion.  You can place a towel over your head and breathe in the steam from hot water coming from a faucet. Avoid close contacts especially the very young and the elderly. Cover your mouth when you cough or sneeze. Always remember to wash your hands.  Get Help Right Away If: You develop worsening fever or throat pain. You develop a severe head ache or visual changes. Your symptoms persist after you have completed your treatment plan.  Make sure you Understand these instructions. Will watch your condition. Will get help right away if you are not doing well or get worse.   Thank you for choosing an e-visit.  Your e-visit answers were reviewed by a board certified advanced clinical practitioner to complete your personal care plan. Depending upon the condition, your plan could have included both over the counter or prescription medications.  Please review your pharmacy choice. Make sure the pharmacy is open so you can pick up prescription now. If there is a problem, you may contact your provider through [employer] and have the prescription routed to another pharmacy.  Your safety is important to [place]. If you have drug allergies check your prescription carefully.   For the next 24 hours you can use MyChart to ask questions about today's visit, request a non-urgent call back, or ask for a work or school excuse. You will get an email in the next two days asking about your experience. I hope that your e-visit has been valuable and will speed your recovery.  I  have spent 5 minutes in review of e-visit questionnaire, review and updating patient chart, medical decision making and response to patient.   Carvel Getting, NP

## 2022-11-24 NOTE — Progress Notes (Signed)
BP 126/84   Pulse 90   Temp 98.5 F (36.9 C) (Oral)   Resp 16   Wt 225 lb 6.4 oz (102.2 kg)   SpO2 99%   BMI 36.38 kg/m    Subjective:    Patient ID: Emily Eaton, female    DOB: [birth year], 46 y.o.   MRN: [id number]  HPI: AVERILL Eaton is a 45 y.o. female  Chief Complaint  Patient presents with   Vaginal Itching   Depression    Patient has stopped medication. Mother recently passed   Vaginal itching:  patient reports that she has had vaginal itching for about two weeks.  She does have a history of recurrent bacterial vaginosis and was given a prescription for metronidazole gel by Dr. Ruthine Dose.  Patient reports she has been using the gel but it is running low.  Will get vaginal swab and treat as appropriate.   Depression: patient reports that her mother recently passed and she is more depressed. She is not currently taking mental health medication. She has previously tried wellbutrin, celexa, cymbalta, and hydroxyzine. She says that she is only getting about three hours of sleep a night.  She would like to try lexapro.  Will start lexapro 10 mg daily follow up in 4 weeks.      11/25/2022    3:29 PM 09/27/2022    1:46 PM 07/12/2022    2:09 PM 12/28/2021    2:59 PM 11/24/2021    3:26 PM  Depression screen PHQ 2/9  Decreased Interest _0 0 1  Down, Depressed, Hopeless _1 0 1  PHQ - 2 Score _2 0 2  Altered sleeping 2 0 1 0 1  Tired, decreased energy 2 0 1 0 2  Change in appetite 2 0 1 0 2  Feeling bad or failure about yourself  0 0 0 0 0  Trouble concentrating 0 0 0 0 1  Moving slowly or fidgety/restless 0 0 0 0 0  Suicidal thoughts 0 0 0 0 0  PHQ-9 Score _3 0 8  Difficult doing work/chores Somewhat difficult  Somewhat difficult Not difficult at all Not difficult at all       11/25/2022    3:29 PM 07/12/2022    2:10 PM 11/24/2021    3:27 PM 11/26/2020    3:23 PM  GAD 7 : Generalized Anxiety Score  Nervous, Anxious, on Edge 2 0 1 0  Control/stop worrying 1  0 1 0  Worry too much - different things 1 0 1 1  Trouble relaxing _4 Restless 1 0 1 0  Easily annoyed or irritable _5 0  Afraid - awful might happen 0 0 1 0  Total GAD 7 Score _6 Anxiety Difficulty Somewhat difficult Somewhat difficult Not difficult at all Not difficult at all     Relevant past medical, surgical, family and social history reviewed and updated as indicated. Interim medical history since our last visit reviewed. Allergies and medications reviewed and updated.  Review of Systems  Constitutional: Negative for fever or weight change.  Respiratory: Negative for cough and shortness of breath.   Cardiovascular: Negative for chest pain or palpitations.  Gastrointestinal: Negative for abdominal pain, no bowel changes.  GU: positive vaginal itching Musculoskeletal: Negative for gait problem or joint swelling.  Skin: Negative for rash.  Neurological: Negative for dizziness or headache.  No other specific complaints in  a complete review of systems (except as listed in HPI above).      Objective:    BP 126/84   Pulse 90   Temp 98.5 F (36.9 C) (Oral)   Resp 16   Wt 225 lb 6.4 oz (102.2 kg)   SpO2 99%   BMI 36.38 kg/m   Wt Readings from Last 3 Encounters:  11/25/22 225 lb 6.4 oz (102.2 kg)  09/27/22 224 lb (101.6 kg)  07/12/22 225 lb 3.2 oz (102.2 kg)    Physical Exam  Constitutional: Patient appears well-developed and well-nourished. Obese  No distress.  HEENT: head atraumatic, normocephalic, pupils equal and reactive to light, neck supple Cardiovascular: Normal rate, regular rhythm and normal heart sounds.  No murmur heard. No BLE edema. Pulmonary/Chest: Effort normal and breath sounds normal. No respiratory distress. Abdominal: Soft.  There is no tenderness. Psychiatric: Patient has a normal mood and affect. behavior is normal. Judgment and thought content normal.   Results for orders placed or performed in visit on 07/12/22  Lipid panel   Result Value Ref Range   Cholesterol 199 <200 mg/dL   HDL 51 > OR = 50 mg/dL   Triglycerides 49 <150 mg/dL   LDL Cholesterol (Calc) 133 (H) mg/dL (calc)   Total CHOL/HDL Ratio 3.9 <5.0 (calc)   Non-HDL Cholesterol (Calc) 148 (H) <130 mg/dL (calc)  COMPLETE METABOLIC PANEL WITH GFR  Result Value Ref Range   Glucose, Bld 75 65 - 99 mg/dL   BUN 9 7 - 25 mg/dL   Creat 0.71 0.50 - 0.99 mg/dL   eGFR 105 > OR = 60 mL/min/1.20m   BUN/Creatinine Ratio NOT APPLICABLE 6 - 22 (calc)   Sodium 138 135 - 146 mmol/L   Potassium 4.2 3.5 - 5.3 mmol/L   Chloride 104 98 - 110 mmol/L   CO2 26 20 - 32 mmol/L   Calcium 9.5 8.6 - 10.2 mg/dL   Total Protein 7.5 6.1 - 8.1 g/dL   Albumin 4.0 3.6 - 5.1 g/dL   Globulin 3.5 1.9 - 3.7 g/dL (calc)   AG Ratio 1.1 1.0 - 2.5 (calc)   Total Bilirubin 0.4 0.2 - 1.2 mg/dL   Alkaline phosphatase (APISO) 55 31 - 125 U/L   AST 9 (L) 10 - 35 U/L   ALT 7 6 - 29 U/L  CBC with Differential/Platelet  Result Value Ref Range   WBC 4.2 3.8 - 10.8 Thousand/uL   RBC 4.87 3.80 - 5.10 Million/uL   Hemoglobin 12.6 11.7 - 15.5 g/dL   HCT 38.9 35.0 - 45.0 %   MCV 79.9 (L) 80.0 - 100.0 fL   MCH 25.9 (L) 27.0 - 33.0 pg   MCHC 32.4 32.0 - 36.0 g/dL   RDW 12.9 11.0 - 15.0 %   Platelets 279 140 - 400 Thousand/uL   MPV 9.6 7.5 - 12.5 fL   Neutro Abs 1,911 1,500 - 7,800 cells/uL   Lymphs Abs 1,890 850 - 3,900 cells/uL   Absolute Monocytes 370 200 - 950 cells/uL   Eosinophils Absolute 21 15 - 500 cells/uL   Basophils Absolute 8 0 - 200 cells/uL   Neutrophils Relative % 45.5 %   Total Lymphocyte 45.0 %   Monocytes Relative 8.8 %   Eosinophils Relative 0.5 %   Basophils Relative 0.2 %  Hemoglobin A1c  Result Value Ref Range   Hgb A1c MFr Bld 5.6 <5.7 % of total Hgb   Mean Plasma Glucose 114 mg/dL   eAG (mmol/L) 6.3 mmol/L  Hepatitis C antibody  Result Value Ref Range   Hepatitis C Ab NON-REACTIVE NON-REACTIVE  HIV Antibody (routine testing w rflx)  Result Value Ref Range    HIV 1&2 Ab, 4th Generation NON-REACTIVE NON-REACTIVE      Assessment & Plan:   Problem List Items Addressed This Visit       Other   Major depression, recurrent, chronic ([place])    start lexapro follow up in 4 weeks       Relevant Medications   escitalopram (LEXAPRO) 10 MG tablet   Other Visit Diagnoses     Vaginal itching    -  Primary   obtain vaginal swab   Relevant Orders   Cervicovaginal ancillary only   Screening for STD (sexually transmitted disease)       Relevant Orders   Hepatitis C antibody   HIV Antibody (routine testing w rflx)   RPR        Follow up plan: Return in about 4 weeks (around 12/23/2022) for follow up, Dr Ancil Boozer.

## 2022-11-25 ENCOUNTER — Other Ambulatory Visit: Payer: Self-pay

## 2022-11-25 ENCOUNTER — Other Ambulatory Visit (HOSPITAL_COMMUNITY)
Admission: RE | Admit: 2022-11-25 | Discharge: 2022-11-25 | Disposition: A | Discharge status: Home / Self Care | Payer: 59 | Source: Ambulatory Visit | Attending: Family Medicine | Admitting: Family Medicine

## 2022-11-25 ENCOUNTER — Ambulatory Visit: Payer: 59 | Admitting: Nurse Practitioner

## 2022-11-25 ENCOUNTER — Encounter: Payer: Self-pay | Admitting: Nurse Practitioner

## 2022-11-25 VITALS — BP 126/84 | HR 90 | Temp 98.5°F | Resp 16 | Wt 225.4 lb

## 2022-11-25 DIAGNOSIS — Z113 Encounter for screening for infections with a predominantly sexual mode of transmission: Secondary | ICD-10-CM

## 2022-11-25 DIAGNOSIS — N898 Other specified noninflammatory disorders of vagina: Secondary | ICD-10-CM | POA: Diagnosis present

## 2022-11-25 DIAGNOSIS — F339 Major depressive disorder, recurrent, unspecified: Secondary | ICD-10-CM | POA: Insufficient documentation

## 2022-11-25 MED ORDER — ESCITALOPRAM OXALATE 10 MG PO TABS: 10.0000 mg | ORAL_TABLET | Freq: Every day | ORAL | 0 refills | Status: DC

## 2022-11-25 NOTE — Assessment & Plan Note (Signed)
start lexapro follow up in 4 weeks

## 2022-11-28 LAB — HIV ANTIBODY (ROUTINE TESTING W REFLEX): HIV 1&2 Ab, 4th Generation: NONREACTIVE

## 2022-11-28 LAB — HEPATITIS C ANTIBODY: Hepatitis C Ab: NONREACTIVE

## 2022-11-28 LAB — RPR: RPR Ser Ql: NONREACTIVE

## 2022-11-29 ENCOUNTER — Other Ambulatory Visit: Payer: Self-pay | Admitting: Nurse Practitioner

## 2022-11-29 DIAGNOSIS — B379 Candidiasis, unspecified: Secondary | ICD-10-CM

## 2022-11-29 DIAGNOSIS — B9689 Other specified bacterial agents as the cause of diseases classified elsewhere: Secondary | ICD-10-CM

## 2022-11-29 LAB — CERVICOVAGINAL ANCILLARY ONLY
Bacterial Vaginitis (gardnerella): POSITIVE — AB
Candida Glabrata: NEGATIVE
Candida Vaginitis: POSITIVE — AB
Chlamydia: NEGATIVE
Comment: NEGATIVE
Comment: NEGATIVE
Comment: NEGATIVE
Comment: NEGATIVE
Comment: NEGATIVE
Comment: NORMAL
Neisseria Gonorrhea: NEGATIVE
Trichomonas: NEGATIVE

## 2022-11-29 MED ORDER — FLUCONAZOLE 150 MG PO TABS: 150.0000 mg | ORAL_TABLET | ORAL | 0 refills | Status: DC | PRN

## 2022-11-29 MED ORDER — METRONIDAZOLE 500 MG PO TABS: 500.0000 mg | ORAL_TABLET | Freq: Two times a day (BID) | ORAL | 0 refills | Status: AC

## 2022-12-23 ENCOUNTER — Ambulatory Visit: Payer: 59 | Admitting: Family Medicine

## 2023-01-10 ENCOUNTER — Encounter: Payer: Self-pay | Admitting: Family Medicine

## 2023-01-11 ENCOUNTER — Other Ambulatory Visit: Payer: Self-pay | Admitting: Family Medicine

## 2023-01-11 MED ORDER — VALACYCLOVIR HCL 500 MG PO TABS: 500.0000 mg | ORAL_TABLET | Freq: Two times a day (BID) | ORAL | 0 refills | Status: DC

## 2023-02-02 ENCOUNTER — Other Ambulatory Visit: Payer: Self-pay | Admitting: Family Medicine

## 2023-02-02 DIAGNOSIS — N760 Acute vaginitis: Secondary | ICD-10-CM

## 2023-05-10 ENCOUNTER — Telehealth: Payer: 59 | Admitting: Physician Assistant

## 2023-05-10 DIAGNOSIS — N898 Other specified noninflammatory disorders of vagina: Secondary | ICD-10-CM

## 2023-05-10 DIAGNOSIS — N951 Menopausal and female climacteric states: Secondary | ICD-10-CM

## 2023-05-11 NOTE — Progress Notes (Signed)
Because the vaginal symptoms can be related to changes in vaginal tissue with perimenopause/menopause and giving all the vasomotor symptoms you are noting (night sweats, etc) and need for further assessment, I feel your condition warrants further evaluation and I recommend that you be seen for a face to face visit.  Please contact your primary care physician practice to be seen. Many offices offer virtual options to be seen via video if you are not comfortable going in person to a medical facility at this time.  NOTE: You will NOT be charged for this eVisit.  If you do not have a PCP, [facility] offers a free physician referral service available at [phone]. Our trained staff has the experience, knowledge and resources to put you in touch with a physician who is right for you.    If you are having a true medical emergency please call 911.   Your e-visit answers were reviewed by a board certified advanced clinical practitioner to complete your personal care plan.  Thank you for using e-Visits.

## 2023-06-02 ENCOUNTER — Ambulatory Visit
Admission: EM | Admit: 2023-06-02 | Discharge: 2023-06-02 | Disposition: A | Discharge status: Home / Self Care | Payer: 59 | Attending: Emergency Medicine | Admitting: Emergency Medicine

## 2023-06-02 DIAGNOSIS — R21 Rash and other nonspecific skin eruption: Secondary | ICD-10-CM

## 2023-06-02 MED ORDER — PREDNISONE 10 MG (21) PO TBPK: ORAL_TABLET | Freq: Every day | ORAL | 0 refills | Status: DC

## 2023-06-02 NOTE — ED Triage Notes (Signed)
Patient to Urgent Care with complaints of rash/ bumps present to the left side of her breast/ ribs. No drainage. Have been itching. Noticed the areas on Wednesday.

## 2023-06-02 NOTE — ED Provider Notes (Signed)
Renaldo Fiddler    CSN: [id number] Arrival date & time: 06/02/23  1108      History   Chief Complaint Chief Complaint  Patient presents with   Rash    HPI JACKSON BLAZER is a 47 y.o. female.  Patient presents with a rash on her left flank x 2 days.  The rash is pruritic.  It started on her left breast and upper chest.  It is now on her left flank also.  Treatment attempted with hydroxyzine; taken last night.  No OTC medications taken today.  No fever, sore throat, cough, shortness of breath, or other symptoms.  Her medical history includes prediabetes, dyslipidemia, GERD, chronic constipation.   The history is provided by the patient and medical records.    Past Medical History:  Diagnosis Date   Acute low back pain    ADD (attention deficit disorder) without hyperactivity    Anemia    Chronic constipation    COVID-19    Herpes simplex without complication    History of ovarian cyst 04/2010   Internal hemorrhoids    Iron deficiency anemia due to chronic blood loss    PMS (premenstrual syndrome)    Vitamin D deficiency     Patient Active Problem List   Diagnosis Date Noted   Major depression, recurrent, chronic (HCC) 11/25/2022   History of COVID-19 10/2019   GERD (gastroesophageal reflux disease) 09/11/2019   Pre-diabetes 07/25/2018   Dyslipidemia 07/25/2018   Mild major depression (HCC) 07/24/2018   Tension headache 07/24/2018   Stiffness of right hand joint 07/28/2015   ADD (attention deficit hyperactivity disorder, inattentive type) 07/25/2015   Chronic constipation 07/25/2015   Herpes 07/25/2015   Hemorrhoids, internal 07/25/2015   Obesity (BMI 30-39.9) 07/25/2015   Vitamin D deficiency 07/25/2015   Radiculitis of right cervical region 04/14/2015   Epicondylitis elbow, medial 10/02/2013   Entrapment of ulnar nerve 10/02/2013   Impingement syndrome of shoulder 10/02/2013   Ovarian cyst 11/15/2011    Past Surgical History:  Procedure Laterality  Date   ENDOMETRIAL BIOPSY     NOVASURE ABLATION     OVARIAN CYST REMOVAL     left   TUBAL LIGATION     WISDOM TOOTH EXTRACTION     x 1    OB History     Gravida  2   Para      Term      Preterm      AB      Living  2      SAB      IAB      Ectopic      Multiple      Live Births  2            Home Medications    Prior to Admission medications   Medication Sig Start Date End Date Taking? Authorizing Provider  predniSONE (STERAPRED UNI-PAK 21 TAB) 10 MG (21) TBPK tablet Take by mouth daily. As directed 06/02/23  Yes Mickie Bail, NP  valACYclovir (VALTREX) 500 MG tablet Take 1 tablet (500 mg total) by mouth 2 (two) times daily. 01/11/23   Alba Cory, MD  Cholecalciferol (VITAMIN D) 50 MCG (2000 UT) CAPS Take 1 capsule (2,000 Units total) by mouth daily. 11/29/18   Alba Cory, MD  escitalopram (LEXAPRO) 10 MG tablet Take 1 tablet (10 mg total) by mouth daily. 11/25/22   Berniece Salines, FNP  fluconazole (DIFLUCAN) 150 MG tablet Take 1 tablet (150  mg total) by mouth every 3 (three) days as needed (for vaginal itching/yeast infection sx). 11/29/22   Berniece Salines, FNP  ibuprofen (ADVIL) 600 MG tablet Take 1 tablet (600 mg total) by mouth every 6 (six) hours as needed. 02/08/22   Becky Augusta, NP  metroNIDAZOLE (METROGEL) 0.75 % vaginal gel INSERT ONE APPLICATORFUL VAGINALLY AT BEDTIME FOR 7 DAYS. AFTER INITIAL THERAPY OF 7 DAYS USE TWICE WEEKLY FOR FIVE MONTHS 02/03/23   Alba Cory, MD  pantoprazole (PROTONIX) 40 MG tablet Take 1 tablet (40 mg total) by mouth daily. 09/27/22   Alba Cory, MD  vitamin C (VITAMIN C) 500 MG tablet Take 1 tablet (500 mg total) by mouth daily. 11/18/19   Delfino Lovett, MD  vitamin E 1000 UNIT capsule Take 1,000 Units by mouth daily.    [provider]    Family History Family History  Problem Relation Age of Onset   Diabetes Mother    Depression Mother    Hypertension Mother    Kidney disease Mother     Hyperlipidemia Mother    Dementia Mother    Stroke Mother        March 18, 2018 (currently in rehab)    Hypertension Father    Diabetes Father    Liver disease Father    Prostate cancer Father    Cancer Maternal Uncle        prostate   Asthma Brother    Thrombocytopenia Son        ITP   ADD / ADHD Son    Breast cancer Neg Hx     Social History Social History   Tobacco Use   Smoking status: Never   Smokeless tobacco: Never  Vaping Use   Vaping Use: Never used  Substance Use Topics   Alcohol use: Yes    Alcohol/week: 2.0 standard drinks of alcohol    Types: 2 Standard drinks or equivalent per week    Comment: occassionally   Drug use: No     Allergies   Codeine   Review of Systems Review of Systems  Constitutional:  Negative for chills and fever.  HENT:  Negative for ear pain and sore throat.   Respiratory:  Negative for cough and shortness of breath.   Cardiovascular:  Negative for chest pain and palpitations.  Musculoskeletal:  Negative for arthralgias and joint swelling.  Skin:  Positive for rash.     Physical Exam Triage Vital Signs ED Triage Vitals  Enc Vitals Group     BP      Pulse      Resp      Temp      Temp src      SpO2      Weight      Height      Head Circumference      Peak Flow      Pain Score      Pain Loc      Pain Edu?      Excl. in GC?    No data found.  Updated Vital Signs Pulse 91   Temp 98.1 F (36.7 C)   Resp 18   LMP 05/17/2023   SpO2 96%   Visual Acuity Right Eye Distance:   Left Eye Distance:   Bilateral Distance:    Right Eye Near:   Left Eye Near:    Bilateral Near:     Physical Exam Vitals and nursing note reviewed.  Constitutional:  General: She is not in acute distress.    Appearance: Normal appearance. She is well-developed. She is not ill-appearing.  HENT:     Mouth/Throat:     Mouth: Mucous membranes are moist.     Pharynx: Oropharynx is clear.  Cardiovascular:     Rate and Rhythm:  Normal rate and regular rhythm.     Heart sounds: Normal heart sounds.  Pulmonary:     Effort: Pulmonary effort is normal. No respiratory distress.     Breath sounds: Normal breath sounds.  Musculoskeletal:     Cervical back: Neck supple.  Skin:    General: Skin is warm and dry.     Findings: Rash present.     Comments: Few scattered papules on left upper chest, left breast, and left lower flank.  No open wounds or drainage.  No erythema.   Neurological:     Mental Status: She is alert.  Psychiatric:        Mood and Affect: Mood normal.        Behavior: Behavior normal.      UC Treatments / Results  Labs (all labs ordered are listed, but only abnormal results are displayed) Labs Reviewed - No data to display  EKG   Radiology No results found.  Procedures Procedures (including critical care time)  Medications Ordered in UC Medications - No data to display  Initial Impression / Assessment and Plan / UC Course  I have reviewed the triage vital signs and the nursing notes.  Pertinent labs & imaging results that were available during my care of the patient were reviewed by me and considered in my medical decision making (see chart for details).    Rash.  Patient is afebrile and vital signs are stable.  The rash crosses multiple dermatomal lines.  Treating today with prednisone taper and Zyrtec.  Education provided on rash.  Instructed patient to follow-up with her PCP if her symptoms are not improving.  She agrees to plan of care.  Final Clinical Impressions(s) / UC Diagnoses   Final diagnoses:  Rash     Discharge Instructions      Take the prednisone as directed.    Take Zyrtec as directed.    Follow up with your primary care provider.       ED Prescriptions     Medication Sig Dispense Auth. Provider   predniSONE (STERAPRED UNI-PAK 21 TAB) 10 MG (21) TBPK tablet Take by mouth daily. As directed 21 tablet Mickie Bail, NP      PDMP not reviewed this  encounter.   Mickie Bail, NP 06/02/23 1159

## 2023-06-02 NOTE — Discharge Instructions (Addendum)
Take the prednisone as directed.    Take Zyrtec as directed.    Follow up with your primary care provider.

## 2023-06-08 ENCOUNTER — Other Ambulatory Visit: Payer: Self-pay | Admitting: Family Medicine

## 2023-06-08 DIAGNOSIS — B9689 Other specified bacterial agents as the cause of diseases classified elsewhere: Secondary | ICD-10-CM

## 2023-06-09 ENCOUNTER — Other Ambulatory Visit: Payer: Self-pay

## 2023-06-09 DIAGNOSIS — N760 Acute vaginitis: Secondary | ICD-10-CM

## 2023-06-09 NOTE — Telephone Encounter (Signed)
Requested medications are due for refill today.  unsure  Requested medications are on the active medications list.  yes  Last refill. 02/03/2023 70g 0 rf  Future visit scheduled.   yes  Notes to clinic.  Medication not assigned to a protocol. Please review for refill.    Requested Prescriptions  Pending Prescriptions Disp Refills   metroNIDAZOLE (METROGEL) 0.75 % vaginal gel [Pharmacy Med Name: metroNIDAZOLE 0.75 % Vaginal Gel] 70 g 0    Sig: INSERT ONE APPLICATORFUL VAGINALLY AT BEDTIME FOR 7 DAYS. AFTER INITIAL THERAPY OF 7 DAYS USE TWICE WEEKLY FOR FIVE MONTHS     Off-Protocol Failed - 06/08/2023  5:16 PM      Failed - Medication not assigned to a protocol, review manually.      Passed - Valid encounter within last 12 months    Recent Outpatient Visits           6 months ago Vaginal itching   [facility] Berniece Salines, FNP   8 months ago Herpes labialis   [facility] Alba Cory, MD   11 months ago Annual physical exam   [facility] Berniece Salines, FNP   1 year ago Potential exposure to STD   [facility] Gabriel Cirri, NP   1 year ago Major depression, recurrent, chronic [facility])   [facility] Alba Cory, MD       Future Appointments             In 1 month Alba Cory, MD [facility]

## 2023-06-12 ENCOUNTER — Other Ambulatory Visit: Payer: Self-pay | Admitting: Family Medicine

## 2023-06-12 DIAGNOSIS — B9689 Other specified bacterial agents as the cause of diseases classified elsewhere: Secondary | ICD-10-CM

## 2023-06-12 MED ORDER — METRONIDAZOLE 0.75 % VA GEL: VAGINAL | 2 refills | Status: DC

## 2023-07-03 ENCOUNTER — Ambulatory Visit: Payer: 59 | Admitting: Nurse Practitioner

## 2023-07-03 ENCOUNTER — Encounter: Payer: Self-pay | Admitting: Nurse Practitioner

## 2023-07-03 ENCOUNTER — Other Ambulatory Visit: Payer: Self-pay

## 2023-07-03 VITALS — BP 120/82 | HR 98 | Temp 98.1°F | Resp 16 | Ht 66.0 in | Wt 235.0 lb

## 2023-07-03 DIAGNOSIS — K640 First degree hemorrhoids: Secondary | ICD-10-CM

## 2023-07-03 MED ORDER — HYDROCORTISONE ACETATE 25 MG RE SUPP: 25.0000 mg | Freq: Two times a day (BID) | RECTAL | 0 refills | Status: DC

## 2023-07-03 NOTE — Progress Notes (Signed)
   BP 120/82   Pulse 98   Temp 98.1 F (36.7 C) (Oral)   Resp 16   Ht 5\' 6"  (1.676 m)   Wt 235 lb (106.6 kg)   SpO2 96%   BMI 37.93 kg/m    Subjective:    Patient ID: Emily Eaton, female    DOB: 11/19/1976, 47 y.o.   MRN: [id number]  HPI: Emily Eaton is a 47 y.o. female  Chief Complaint  Patient presents with   Hemorrhoids    Flare up for 1 week   Hemorrhoids:  patient reports she she has had some anus pain. She reports that she did notice some blood on her toilet tissue but not in her stool. She says she has had hemorrhoids in the past and that is what it feels like. Upon exam she does have an external hemorrhoid. She tried some preparation H but she reports that did not help. Discussed causes of hemorrhoids including anal sex and constipation. Discussed using lubricant for anal sex and increasing fiber, water intake for constipation. And when constipated  can take a stool softener to help.  Recommend doing sitz baths. Will treat with Anusol.  If no improvement will refer to gi.   Relevant past medical, surgical, family and social history reviewed and updated as indicated. Interim medical history since our last visit reviewed. Allergies and medications reviewed and updated.  Review of Systems  Constitutional: Negative for fever or weight change.  Respiratory: Negative for cough and shortness of breath.   Cardiovascular: Negative for chest pain or palpitations.  Gastrointestinal: Negative for abdominal pain, no bowel changes.  Musculoskeletal: Negative for gait problem or joint swelling.  Skin: Negative for rash.  Neurological: Negative for dizziness or headache.  No other specific complaints in a complete review of systems (except as listed in HPI above).      Objective:    BP 120/82   Pulse 98   Temp 98.1 F (36.7 C) (Oral)   Resp 16   Ht 5\' 6"  (1.676 m)   Wt 235 lb (106.6 kg)   SpO2 96%   BMI 37.93 kg/m   Wt Readings from Last 3 Encounters:  07/03/23 235  lb (106.6 kg)  11/25/22 225 lb 6.4 oz (102.2 kg)  09/27/22 224 lb (101.6 kg)    Physical Exam  Constitutional: Patient appears well-developed and well-nourished. Obese  No distress.  HEENT: head atraumatic, normocephalic, pupils equal and reactive to light, neck supple, throat within normal limits Cardiovascular: Normal rate, regular rhythm and normal heart sounds.  No murmur heard. No BLE edema. Pulmonary/Chest: Effort normal and breath sounds normal. No respiratory distress. Abdominal: Soft.  There is no tenderness. Rectal exam: external hemorrhoids noted.  Psychiatric: Patient has a normal mood and affect. behavior is normal. Judgment and thought content normal.     Assessment & Plan:   Problem List Items Addressed This Visit   None Visit Diagnoses     Grade I hemorrhoids    -  Primary   use anusol suppository.  increase fiber and water intake.  if no improvement will refer to gi.   Relevant Medications   hydrocortisone (ANUSOL-HC) 25 MG suppository        Follow up plan: Return if symptoms worsen or fail to improve.

## 2023-07-13 NOTE — Progress Notes (Signed)
Name: Emily Eaton   MRN: [id number]    DOB: 05/19/1976   Date:07/14/2023       Progress Note   Chief Complaint  Annual Exam  HPI  Patient presents for annual CPE.  Diet: eats fast food frequency, does not pack her meals. She  Exercise: discussed regular activity   Last Eye Exam: up to date  Last Dental Exam: she is looking for a dentist now   [employer] Visit from 07/14/2023 in [facility]  AUDIT-C Score 2      Depression: Phq 9 is  positive    07/14/2023    9:09 AM 07/03/2023   11:36 AM 11/25/2022    3:29 PM 09/27/2022    1:46 PM 07/12/2022    2:09 PM  Depression screen PHQ 2/9  Decreased Interest 1 1 2 1 1   Down, Depressed, Hopeless 2 1 3 1 1   PHQ - 2 Score 3 2 5 2 2   Altered sleeping 2 1 2  0 1  Tired, decreased energy 3 1 2  0 1  Change in appetite 3 0 2 0 1  Feeling bad or failure about yourself  0 0 0 0 0  Trouble concentrating 1 0 0 0 0  Moving slowly or fidgety/restless 0 0 0 0 0  Suicidal thoughts 0 0 0 0 0  PHQ-9 Score 12 4 11 2 5   Difficult doing work/chores Not difficult at all Not difficult at all Somewhat difficult  Somewhat difficult   Hypertension: BP Readings from Last 3 Encounters:  07/14/23 120/82  07/03/23 120/82  11/25/22 126/84   Obesity: Wt Readings from Last 3 Encounters:  07/14/23 234 lb 12.8 oz (106.5 kg)  07/03/23 235 lb (106.6 kg)  11/25/22 225 lb 6.4 oz (102.2 kg)   BMI Readings from Last 3 Encounters:  07/14/23 37.90 kg/m  07/03/23 37.93 kg/m  11/25/22 36.38 kg/m     Vaccines:   HPV: N/A Tdap: up to date Shingrix: N/A Pneumonia: N/A Flu: discussed yearly shots  COVID-19: up to date   Hep C Screening: 11/25/22 STD testing and prevention (HIV/chl/gon/syphilis): 11/25/22 Intimate partner violence: negative screen  Sexual History : she has one partner in the past 18 months, no condoms. Menstrual History/LMP/Abnormal Bleeding: cycles have become irregular , having hot flashes, mood  swings, skipping cycles , sometimes is heavy Discussed importance of follow up if any post-menopausal bleeding: yes  Incontinence Symptoms: negative for symptoms   Breast cancer:  - Last Mammogram: 09/14/22 - BRCA gene screening: N/A  Osteoporosis Prevention : Discussed high calcium and vitamin D supplementation, weight bearing exercises Bone density: N/A   Cervical cancer screening: 09/11/19  Skin cancer: Discussed monitoring for atypical lesions  Colorectal cancer: discussed options , she prefers cologuard  Lung cancer:  Low Dose CT Chest recommended if Age 59-80 years, 20 pack-year currently smoking OR have quit w/in 15years. Patient does not qualify for screen   ECG: 02/08/22  Advanced Care Planning: A voluntary discussion about advance care planning including the explanation and discussion of advance directives.  Discussed health care proxy and Living will, and the patient was able to identify a health care proxy as Stanton Kidney - oldest child .  Patient does not have a living will and power of attorney of health care   Lipids: Lab Results  Component Value Date   CHOL 199 07/12/2022   CHOL 184 05/18/2021   CHOL 166 09/11/2019   Lab Results  Component Value Date  HDL 51 07/12/2022   HDL 50 05/18/2021   HDL 41 (L) 09/11/2019   Lab Results  Component Value Date   LDLCALC 133 (H) 07/12/2022   LDLCALC 120 (H) 05/18/2021   LDLCALC 109 (H) 09/11/2019   Lab Results  Component Value Date   TRIG 49 07/12/2022   TRIG 50 05/18/2021   TRIG 74 09/11/2019   Lab Results  Component Value Date   CHOLHDL 3.9 07/12/2022   CHOLHDL 3.7 05/18/2021   CHOLHDL 4.0 09/11/2019   No results found for: "LDLDIRECT"  Glucose: Glucose, Bld  Date Value Ref Range Status  07/12/2022 75 65 - 99 mg/dL Final    Comment:    .            Fasting reference interval .   05/18/2021 78 65 - 99 mg/dL Final    Comment:    .            Fasting reference interval .   03/24/2020 92 65 - 99  mg/dL Final    Comment:    .            Fasting reference interval .    Glucose-Capillary  Date Value Ref Range Status  11/15/2019 147 (H) 70 - 99 mg/dL Final    Patient Active Problem List   Diagnosis Date Noted   Major depression, recurrent, chronic (HCC) 11/25/2022   History of COVID-19 10/2019   GERD (gastroesophageal reflux disease) 09/11/2019   Pre-diabetes 07/25/2018   Dyslipidemia 07/25/2018   Mild major depression (HCC) 07/24/2018   Tension headache 07/24/2018   Stiffness of right hand joint 07/28/2015   ADD (attention deficit hyperactivity disorder, inattentive type) 07/25/2015   Chronic constipation 07/25/2015   Herpes 07/25/2015   Hemorrhoids, internal 07/25/2015   Obesity (BMI 30-39.9) 07/25/2015   Vitamin D deficiency 07/25/2015   Radiculitis of right cervical region 04/14/2015   Epicondylitis elbow, medial 10/02/2013   Entrapment of ulnar nerve 10/02/2013   Impingement syndrome of shoulder 10/02/2013   Ovarian cyst 11/15/2011    Past Surgical History:  Procedure Laterality Date   ENDOMETRIAL BIOPSY     NOVASURE ABLATION     OVARIAN CYST REMOVAL     left   TUBAL LIGATION     WISDOM TOOTH EXTRACTION     x 1    Family History  Problem Relation Age of Onset   Diabetes Mother    Depression Mother    Hypertension Mother    Kidney disease Mother    Hyperlipidemia Mother    Dementia Mother    Stroke Mother        March 18, 2018 (currently in rehab)    Hypertension Father    Diabetes Father    Liver disease Father    Prostate cancer Father    Cancer Maternal Uncle        prostate   Asthma Brother    Thrombocytopenia Son        ITP   ADD / ADHD Son    Breast cancer Neg Hx     Social History   Socioeconomic History   Marital status: Divorced    Spouse name: Delynn Flavin   Number of children: 2   Years of education: 14   Highest education level: Associate degree: academic program  Occupational History   Occupation: Runner, broadcasting/film/video    Comment: [employer]   Occupation: Training and development officer: [employer]  Tobacco Use   Smoking status: Never   Smokeless  tobacco: Never  Vaping Use   Vaping status: Never Used  Substance and Sexual Activity   Alcohol use: Yes    Alcohol/week: 2.0 standard drinks of alcohol    Types: 2 Standard drinks or equivalent per week    Comment: occassionally   Drug use: No   Sexual activity: Yes    Partners: Male    Birth control/protection: Condom, Surgical    Comment: Tubaligation  Other Topics Concern   Not on file  Social History Narrative   Divorced July 5th, 2019, but they are still sexually active   Son is in the [employer] going to [place]  from 07/2018 to 06/2019, he is at home right now, he will return to his job end of the year   Youngest son is at home   Mother in Nursing Home after stroke in 2019   Father diagnosed with prostate cancer 04/2019   Social Determinants of Health   Financial Resource Strain: Low Risk  (07/02/2023)   Overall Financial Resource Strain (CARDIA)    Difficulty of Paying Living Expenses: Not hard at all  Food Insecurity: No Food Insecurity (07/14/2023)   Hunger Vital Sign    Worried About Running Out of Food in the Last Year: Never true    Ran Out of Food in the Last Year: Never true  Transportation Needs: No Transportation Needs (07/14/2023)   PRAPARE - Administrator, Civil Service (Medical): No    Lack of Transportation (Non-Medical): No  Physical Activity: Inactive (07/14/2023)   Exercise Vital Sign    Days of Exercise per Week: 0 days    Minutes of Exercise per Session: 0 min  Stress: Stress Concern Present (07/14/2023)   [employer] of Occupational Health - Occupational Stress Questionnaire    Feeling of Stress : Rather much  Social Connections: Socially Isolated (07/14/2023)   Social Connection and Isolation Panel [NHANES]    Frequency of Communication with Friends and Family: More than three times a week    Frequency of Social  Gatherings with Friends and Family: Once a week    Attends Religious Services: Never    Database administrator or Organizations: No    Attends Banker Meetings: Never    Marital Status: Divorced  Catering manager Violence: Not At Risk (07/14/2023)   Humiliation, Afraid, Rape, and Kick questionnaire    Fear of Current or Ex-Partner: No    Emotionally Abused: No    Physically Abused: No    Sexually Abused: No     Current Outpatient Medications:    Cholecalciferol (VITAMIN D) 50 MCG (2000 UT) CAPS, Take 1 capsule (2,000 Units total) by mouth daily., Disp: 30 capsule, Rfl: 0   hydrocortisone (ANUSOL-HC) 25 MG suppository, Place 1 suppository (25 mg total) rectally 2 (two) times daily., Disp: 12 suppository, Rfl: 0   metroNIDAZOLE (METROGEL) 0.75 % vaginal gel, Place vaginally 2 (two) times a week., Disp: 70 g, Rfl: 2   pantoprazole (PROTONIX) 40 MG tablet, Take 1 tablet (40 mg total) by mouth daily., Disp: 90 tablet, Rfl: 0   valACYclovir (VALTREX) 500 MG tablet, Take 1 tablet (500 mg total) by mouth 2 (two) times daily., Disp: 10 tablet, Rfl: 0   vitamin C (VITAMIN C) 500 MG tablet, Take 1 tablet (500 mg total) by mouth daily., Disp: 30 tablet, Rfl: 0   vitamin E 1000 UNIT capsule, Take 1,000 Units by mouth daily., Disp: , Rfl:   Allergies  Allergen Reactions  Codeine Itching and Rash     ROS  Constitutional: Negative for fever or weight change.  Respiratory: Negative for cough and shortness of breath.   Cardiovascular: Negative for chest pain or palpitations.  Gastrointestinal: Negative for abdominal pain, no bowel changes.  Musculoskeletal: Negative for gait problem or joint swelling.  Skin: Negative for rash.  Neurological: Negative for dizziness or headache.  No other specific complaints in a complete review of systems (except as listed in HPI above).   Objective  Vitals:   07/14/23 0905  BP: 120/82  Pulse: 88  Resp: 16  Temp: 98.1 F (36.7 C)  TempSrc:  Oral  SpO2: 97%  Weight: 234 lb 12.8 oz (106.5 kg)  Height: 5\' 6"  (1.676 m)    Body mass index is 37.9 kg/m.  Physical Exam  Constitutional: Patient appears well-developed and well-nourished. No distress.  HENT: Head: Normocephalic and atraumatic. Ears: B TMs ok, no erythema or effusion; Nose: Nose normal. Mouth/Throat: Oropharynx is clear and moist. No oropharyngeal exudate.  Eyes: Conjunctivae and EOM are normal. Pupils are equal, round, and reactive to light. No scleral icterus.  Neck: Normal range of motion. Neck supple. No JVD present. No thyromegaly present.  Cardiovascular: Normal rate, regular rhythm and normal heart sounds.  No murmur heard. No BLE edema. Pulmonary/Chest: Effort normal and breath sounds normal. No respiratory distress. Abdominal: Soft. Bowel sounds are normal, no distension. There is no tenderness. no masses Breast: no lumps or masses, no nipple discharge or rashes FEMALE GENITALIA:  External genitalia normal External urethra normal Vaginal vault normal without discharge or lesions Cervix normal without discharge or lesions Bimanual exam normal without masses RECTAL: not done  Musculoskeletal: Normal range of motion, no joint effusions. No gross deformities Neurological: he is alert and oriented to person, place, and time. No cranial nerve deficit. Coordination, balance, strength, speech and gait are normal.  Skin: Skin is warm and dry. No rash noted. No erythema.  Psychiatric: Patient has a normal mood and affect. behavior is normal. Judgment and thought content normal.    Fall Risk:    07/14/2023    9:08 AM 07/03/2023   11:35 AM 11/25/2022    3:26 PM 09/27/2022    1:46 PM 07/12/2022    2:09 PM  Fall Risk   Falls in the past year? 0 0 0 0 0  Number falls in past yr:  0 0 0 0  Injury with Fall?  0 0 0 0  Risk for fall due to : No Fall Risks   No Fall Risks   Follow up Falls prevention discussed;Education provided;Falls evaluation completed  Falls  evaluation completed Falls prevention discussed Falls evaluation completed     Functional Status Survey: Is the patient deaf or have difficulty hearing?: No Does the patient have difficulty seeing, even when wearing glasses/contacts?: No Does the patient have difficulty concentrating, remembering, or making decisions?: Yes Does the patient have difficulty walking or climbing stairs?: No Does the patient have difficulty dressing or bathing?: No Does the patient have difficulty doing errands alone such as visiting a doctor's office or shopping?: No   Assessment & Plan   1. Well adult exam  - Cytology - PAP - MM 3D SCREENING MAMMOGRAM BILATERAL BREAST; Future - Lipid panel - CBC with Differential/Platelet - COMPLETE METABOLIC PANEL WITH GFR - Hemoglobin A1c - HIV Antibody (routine testing w rflx) - RPR - VITAMIN D 25 Hydroxy (Vit-D Deficiency, Fractures) - Iron, TIBC and Ferritin Panel - Cologuard  2.  Colon cancer screening  - VITAMIN D 25 Hydroxy (Vit-D Deficiency, Fractures)  3. Breast cancer screening by mammogram  - MM 3D SCREENING MAMMOGRAM BILATERAL BREAST; Future  4. Cervical cancer screening  - Cytology - PAP  5. Pre-diabetes  - Hemoglobin A1c  6. Dyslipidemia  - Lipid panel  7. Screening for STD (sexually transmitted disease)  - HIV Antibody (routine testing w rflx) - RPR  8. Vitamin D deficiency  - VITAMIN D 25 Hydroxy (Vit-D Deficiency, Fractures)  9. History of anemia  - CBC with Differential/Platelet - Iron, TIBC and Ferritin Panel  10. Long-term use of high-risk medication  - CBC with Differential/Platelet - COMPLETE METABOLIC PANEL WITH GFR  11. Perimenopause  - norgestimate-ethinyl estradiol (SPRINTEC 28) 0.25-35 MG-MCG tablet; Take 1 tablet by mouth daily.  Dispense: 84 tablet; Refill: 3   -USPSTF grade A and B recommendations reviewed with patient; age-appropriate recommendations, preventive care, screening tests, etc discussed  and encouraged; healthy living encouraged; see AVS for patient education given to patient -Discussed importance of 150 minutes of physical activity weekly, eat two servings of fish weekly, eat one serving of tree nuts ( cashews, pistachios, pecans, almonds.Marland Kitchen) every other day, eat 6 servings of fruit/vegetables daily and drink plenty of water and avoid sweet beverages.   -Reviewed Health Maintenance: Yes.

## 2023-07-14 ENCOUNTER — Other Ambulatory Visit (HOSPITAL_COMMUNITY): Admission: RE | Admit: 2023-07-14 | Payer: 59 | Source: Ambulatory Visit

## 2023-07-14 ENCOUNTER — Encounter: Payer: Self-pay | Admitting: Family Medicine

## 2023-07-14 ENCOUNTER — Ambulatory Visit (INDEPENDENT_AMBULATORY_CARE_PROVIDER_SITE_OTHER): Payer: 59 | Admitting: Family Medicine

## 2023-07-14 VITALS — BP 120/82 | HR 88 | Temp 98.1°F | Resp 16 | Ht 66.0 in | Wt 234.8 lb

## 2023-07-14 DIAGNOSIS — Z1231 Encounter for screening mammogram for malignant neoplasm of breast: Secondary | ICD-10-CM

## 2023-07-14 DIAGNOSIS — Z0000 Encounter for general adult medical examination without abnormal findings: Secondary | ICD-10-CM | POA: Insufficient documentation

## 2023-07-14 DIAGNOSIS — N951 Menopausal and female climacteric states: Secondary | ICD-10-CM

## 2023-07-14 DIAGNOSIS — Z1211 Encounter for screening for malignant neoplasm of colon: Secondary | ICD-10-CM | POA: Diagnosis not present

## 2023-07-14 DIAGNOSIS — Z124 Encounter for screening for malignant neoplasm of cervix: Secondary | ICD-10-CM

## 2023-07-14 DIAGNOSIS — Z862 Personal history of diseases of the blood and blood-forming organs and certain disorders involving the immune mechanism: Secondary | ICD-10-CM

## 2023-07-14 DIAGNOSIS — E559 Vitamin D deficiency, unspecified: Secondary | ICD-10-CM

## 2023-07-14 DIAGNOSIS — Z79899 Other long term (current) drug therapy: Secondary | ICD-10-CM

## 2023-07-14 DIAGNOSIS — R7303 Prediabetes: Secondary | ICD-10-CM

## 2023-07-14 DIAGNOSIS — Z113 Encounter for screening for infections with a predominantly sexual mode of transmission: Secondary | ICD-10-CM

## 2023-07-14 DIAGNOSIS — E785 Hyperlipidemia, unspecified: Secondary | ICD-10-CM

## 2023-07-14 LAB — CBC WITH DIFFERENTIAL/PLATELET
Absolute Monocytes: 304 cells/uL (ref 200–950)
Basophils Absolute: 20 cells/uL (ref 0–200)
Basophils Relative: 0.6 %
Eosinophils Absolute: 50 cells/uL (ref 15–500)
Eosinophils Relative: 1.5 %
HCT: 39.4 % (ref 35.0–45.0)
Hemoglobin: 12.3 g/dL (ref 11.7–15.5)
Lymphs Abs: 1383 cells/uL (ref 850–3900)
MCH: 25.3 pg — ABNORMAL LOW (ref 27.0–33.0)
MCHC: 31.2 g/dL — ABNORMAL LOW (ref 32.0–36.0)
MCV: 81.1 fL (ref 80.0–100.0)
MPV: 9.6 fL (ref 7.5–12.5)
Monocytes Relative: 9.2 %
Neutro Abs: 1544 cells/uL (ref 1500–7800)
Neutrophils Relative %: 46.8 %
Platelets: 297 10*3/uL (ref 140–400)
RBC: 4.86 10*6/uL (ref 3.80–5.10)
RDW: 13 % (ref 11.0–15.0)
Total Lymphocyte: 41.9 %
WBC: 3.3 10*3/uL — ABNORMAL LOW (ref 3.8–10.8)

## 2023-07-14 MED ORDER — NORGESTIMATE-ETH ESTRADIOL 0.25-35 MG-MCG PO TABS
1.0000 | ORAL_TABLET | Freq: Every day | ORAL | 3 refills | Status: DC
Start: 2023-07-14 — End: 2023-12-08

## 2023-07-18 ENCOUNTER — Encounter: Payer: 59 | Admitting: Family Medicine

## 2023-07-19 NOTE — Progress Notes (Unsigned)
Name: Emily Eaton   MRN: [id number]    DOB: [birth year]   Date:07/20/2023       Progress Note  Subjective  Chief Complaint  1 week follow up  HPI  GERD: she states reflux symptoms are under control as long as she follows a GERD appropriate diet, but take PPI prn and needs a refill    Major Depression: she has a long history of depression, she was doing better and stopped taking Celexa 2018 She states mother had a major stroke years ago, she went through a  divorce 06/2018, younger son has ITP ( found out in 2018, he is doing better now ) Older son is back from the being deployed and working full time but is in the reserve and was called back to be deployed . She is feeling overwhelmed at work, she works at [employer] and also as a Fish farm manager part time at [employer] ( second job is not as stressful )  She also lost her mother in October and is still grieving . She has been off medications but would .    Tension headaches: she has around her cycles but her cycles not as heavy lately but very irregular and just started ocp to take for 3 months.  Pain is usually nuchal or occipital, no radiation, not associated with nausea or vomiting.    Insomnia: she stopped Temazepam and is doing well on Melatonin otc prn. She is currently taking hydroxyzine prn at night and doing well  Morbid obesity: BMI over 35 with co-morbidities such as GERD, pre diabetes and dyslipidemia. She is only eating 3 meals and trying to not snack, she feels like stress causes her to eat more and would like to try medication. She has tried Weight Watchers in the past and it worked temporarily but not able to sustain the weight loss . She will check to see if insurance covers weight loss medication.    Pre-diabetes : last A1C was up 6 % , no polyphagia, polydipsia or polyuria    Vitamin D deficiency: she has been taking otc vitamin D daily and last level still slightly low we will give rx for 3 months after that go down to otc again     Dyslipidemia: she is on diet only   The 10-year ASCVD risk score (Arnett DK, et al., 2019) is: 1.3%   Values used to calculate the score:     Age: 47 years     Sex: Female     Is Non-Hispanic African American: Yes     Diabetic: No     Tobacco smoker: No     Systolic Blood Pressure: 116 mmHg     Is BP treated: No     HDL Cholesterol: 53 mg/dL     Total Cholesterol: 212 mg/dL    Recurrent BV: she continues to have recurrent vaginal discharge and odor, positive cultures before. She is tired, worse after her cycles. She has tried multiple therapies. She is doing better on Metrogel once or twice a week and has helped with recurrent symptoms     Patient Active Problem List   Diagnosis Date Noted   Major depression, recurrent, chronic (HCC) 11/25/2022   History of COVID-19 10/2019   GERD (gastroesophageal reflux disease) 09/11/2019   Pre-diabetes 07/25/2018   Dyslipidemia 07/25/2018   Mild major depression (HCC) 07/24/2018   Tension headache 07/24/2018   Stiffness of right hand joint 07/28/2015   ADD (attention deficit hyperactivity disorder, inattentive type)  07/25/2015   Chronic constipation 07/25/2015   Herpes 07/25/2015   Hemorrhoids, internal 07/25/2015   Obesity (BMI 30-39.9) 07/25/2015   Vitamin D deficiency 07/25/2015   Radiculitis of right cervical region 04/14/2015   Epicondylitis elbow, medial 10/02/2013   Entrapment of ulnar nerve 10/02/2013   Impingement syndrome of shoulder 10/02/2013   Ovarian cyst 11/15/2011    Past Surgical History:  Procedure Laterality Date   ENDOMETRIAL BIOPSY     NOVASURE ABLATION     OVARIAN CYST REMOVAL     left   TUBAL LIGATION     WISDOM TOOTH EXTRACTION     x 1    Family History  Problem Relation Age of Onset   Diabetes Mother    Depression Mother    Hypertension Mother    Kidney disease Mother    Hyperlipidemia Mother    Dementia Mother    Stroke Mother        March 18, 2018 (currently in rehab)    Hypertension Father     Diabetes Father    Liver disease Father    Prostate cancer Father    Cancer Maternal Uncle        prostate   Asthma Brother    Thrombocytopenia Son        ITP   ADD / ADHD Son    Breast cancer Neg Hx     Social History   Tobacco Use   Smoking status: Never   Smokeless tobacco: Never  Substance Use Topics   Alcohol use: Yes    Alcohol/week: 2.0 standard drinks of alcohol    Types: 2 Standard drinks or equivalent per week    Comment: occassionally     Current Outpatient Medications:    Cholecalciferol (VITAMIN D) 50 MCG (2000 UT) CAPS, Take 1 capsule (2,000 Units total) by mouth daily., Disp: 30 capsule, Rfl: 0   hydrocortisone (ANUSOL-HC) 25 MG suppository, Place 1 suppository (25 mg total) rectally 2 (two) times daily., Disp: 12 suppository, Rfl: 0   metroNIDAZOLE (METROGEL) 0.75 % vaginal gel, Place vaginally 2 (two) times a week., Disp: 70 g, Rfl: 2   norgestimate-ethinyl estradiol (SPRINTEC 28) 0.25-35 MG-MCG tablet, Take 1 tablet by mouth daily., Disp: 84 tablet, Rfl: 3   pantoprazole (PROTONIX) 40 MG tablet, Take 1 tablet (40 mg total) by mouth daily., Disp: 90 tablet, Rfl: 0   valACYclovir (VALTREX) 500 MG tablet, Take 1 tablet (500 mg total) by mouth 2 (two) times daily., Disp: 10 tablet, Rfl: 0   vitamin C (VITAMIN C) 500 MG tablet, Take 1 tablet (500 mg total) by mouth daily., Disp: 30 tablet, Rfl: 0   vitamin E 1000 UNIT capsule, Take 1,000 Units by mouth daily., Disp: , Rfl:   Allergies  Allergen Reactions   Codeine Itching and Rash    I personally reviewed active problem list, medication list, allergies, family history, social history with the patient/caregiver today.   ROS  Constitutional: Negative for fever , positive  weight change -gained 10 lbs since last October.  Respiratory: Negative for cough and shortness of breath.   Cardiovascular: Negative for chest pain or palpitations.  Gastrointestinal: Negative for abdominal pain, no bowel changes.   Musculoskeletal: Negative for gait problem or joint swelling.  Skin: Negative for rash.  Neurological: Negative for dizziness or headache.  No other specific complaints in a complete review of systems (except as listed in HPI above).   Objective  Vitals:   07/20/23 1013  BP: 116/78  Pulse: 80  Resp: 16  Temp: 97.9 F (36.6 C)  TempSrc: Oral  SpO2: 98%  Weight: 234 lb 14.4 oz (106.5 kg)  Height: 5\' 6"  (1.676 m)    Body mass index is 37.91 kg/m.  Physical Exam  Constitutional: Patient appears well-developed and well-nourished. Obese  No distress.  HEENT: head atraumatic, normocephalic, pupils equal and reactive to light, neck supple Cardiovascular: Normal rate, regular rhythm and normal heart sounds.  No murmur heard. No BLE edema. Pulmonary/Chest: Effort normal and breath sounds normal. No respiratory distress. Abdominal: Soft.  There is no tenderness. Psychiatric: Patient has a normal mood and affect. behavior is normal. Judgment and thought content normal.   Recent Results (from the past 2160 hour(s))  Cytology - PAP     Status: None   Collection Time: 07/14/23  9:22 AM  Result Value Ref Range   High risk HPV Negative    Neisseria Gonorrhea Negative    Chlamydia Negative    Adequacy      Satisfactory for evaluation; transformation zone component PRESENT.   Diagnosis      - Negative for Intraepithelial Lesions or Malignancy (NILM)   Diagnosis - Benign reactive/reparative changes    Microorganisms      Fungal organisms present consistent with Candida spp.   Comment Normal Reference Range HPV - Negative    Comment Normal Reference Ranger Chlamydia - Negative    Comment      Normal Reference Range Neisseria Gonorrhea - Negative  Lipid panel     Status: Abnormal   Collection Time: 07/14/23  9:45 AM  Result Value Ref Range   Cholesterol 212 (H) <200 mg/dL   HDL 53 > OR = 50 mg/dL   Triglycerides 49 <440 mg/dL   LDL Cholesterol (Calc) 144 (H) mg/dL (calc)     Comment: Reference range: <100 . Desirable range <100 mg/dL for primary prevention;   <70 mg/dL for patients with CHD or diabetic patients  with > or = 2 CHD risk factors. Marland Kitchen LDL-C is now calculated using the Martin-Hopkins  calculation, which is a validated novel method providing  better accuracy than the Friedewald equation in the  estimation of LDL-C.  Horald Pollen et al. Lenox Ahr. 1027;253(66): 2061-2068  (http://education.[facility].com/faq/FAQ164)    Total CHOL/HDL Ratio 4.0 <5.0 (calc)   Non-HDL Cholesterol (Calc) 159 (H) <130 mg/dL (calc)    Comment: For patients with diabetes plus 1 major ASCVD risk  factor, treating to a non-HDL-C goal of <100 mg/dL  (LDL-C of <44 mg/dL) is considered a therapeutic  option.   CBC with Differential/Platelet     Status: Abnormal   Collection Time: 07/14/23  9:45 AM  Result Value Ref Range   WBC 3.3 (L) 3.8 - 10.8 Thousand/uL   RBC 4.86 3.80 - 5.10 Million/uL   Hemoglobin 12.3 11.7 - 15.5 g/dL   HCT 03.4 74.2 - 59.5 %   MCV 81.1 80.0 - 100.0 fL   MCH 25.3 (L) 27.0 - 33.0 pg   MCHC 31.2 (L) 32.0 - 36.0 g/dL   RDW 63.8 75.6 - 43.3 %   Platelets 297 140 - 400 Thousand/uL   MPV 9.6 7.5 - 12.5 fL   Neutro Abs 1,544 1,500 - 7,800 cells/uL   Lymphs Abs 1,383 850 - 3,900 cells/uL   Absolute Monocytes 304 200 - 950 cells/uL   Eosinophils Absolute 50 15 - 500 cells/uL   Basophils Absolute 20 0 - 200 cells/uL   Neutrophils Relative % 46.8 %   Total Lymphocyte 41.9 %  Monocytes Relative 9.2 %   Eosinophils Relative 1.5 %   Basophils Relative 0.6 %  COMPLETE METABOLIC PANEL WITH GFR     Status: Abnormal   Collection Time: 07/14/23  9:45 AM  Result Value Ref Range   Glucose, Bld 92 65 - 99 mg/dL    Comment: .            Fasting reference interval .    BUN 6 (L) 7 - 25 mg/dL   Creat 2.84 1.32 - 4.40 mg/dL   eGFR 102 > OR = 60 VO/ZDG/6.44I3   BUN/Creatinine Ratio 8 6 - 22 (calc)   Sodium 139 135 - 146 mmol/L   Potassium 4.9 3.5 - 5.3  mmol/L   Chloride 104 98 - 110 mmol/L   CO2 30 20 - 32 mmol/L   Calcium 9.5 8.6 - 10.2 mg/dL   Total Protein 7.0 6.1 - 8.1 g/dL   Albumin 3.8 3.6 - 5.1 g/dL   Globulin 3.2 1.9 - 3.7 g/dL (calc)   AG Ratio 1.2 1.0 - 2.5 (calc)   Total Bilirubin 0.3 0.2 - 1.2 mg/dL   Alkaline phosphatase (APISO) 60 31 - 125 U/L   AST 12 10 - 35 U/L   ALT 9 6 - 29 U/L  Hemoglobin A1c     Status: Abnormal   Collection Time: 07/14/23  9:45 AM  Result Value Ref Range   Hgb A1c MFr Bld 6.0 (H) <5.7 % of total Hgb    Comment: For someone without known diabetes, a hemoglobin  A1c value between 5.7% and 6.4% is consistent with prediabetes and should be confirmed with a  follow-up test. . For someone with known diabetes, a value <7% indicates that their diabetes is well controlled. A1c targets should be individualized based on duration of diabetes, age, comorbid conditions, and other considerations. . This assay result is consistent with an increased risk of diabetes. . Currently, no consensus exists regarding use of hemoglobin A1c for diagnosis of diabetes for children. .    Mean Plasma Glucose 126 mg/dL   eAG (mmol/L) 7.0 mmol/L    Comment: . This test was performed on the Roche cobas c503 platform. Effective 09/26/22, a change in test platforms from the Abbott Architect to the Roche cobas c503 may have shifted HbA1c results compared to historical results. Based on laboratory validation testing conducted at [facility], the Roche platform relative to the Abbott platform had an average increase in HbA1c value of < or = 0.3%. This difference is within accepted  variability established by the [facility]. Note that not all individuals will have had a shift in their results and direct comparisons between historical and current results for testing conducted on different platforms is not recommended.   HIV Antibody (routine testing w rflx)     Status: None    Collection Time: 07/14/23  9:45 AM  Result Value Ref Range   HIV 1&2 Ab, 4th Generation NON-REACTIVE NON-REACTIVE    Comment: HIV-1 antigen and HIV-1/HIV-2 antibodies were not detected. There is no laboratory evidence of HIV infection. Marland Kitchen PLEASE NOTE: This information has been disclosed to you from records whose confidentiality may be protected by state law.  If your state requires such protection, then the state law prohibits you from making any further disclosure of the information without the specific written consent of the person to whom it pertains, or as otherwise permitted by law. A general authorization for the release of medical or other information is NOT sufficient  for this purpose. . For additional information please refer to http://education.[facility].com/faq/FAQ106 (This link is being provided for informational/ educational purposes only.) . Marland Kitchen The performance of this assay has not been clinically validated in patients less than 32 years old. .   RPR     Status: None   Collection Time: 07/14/23  9:45 AM  Result Value Ref Range   RPR Ser Ql NON-REACTIVE NON-REACTIVE    Comment: . No laboratory evidence of syphilis. If recent exposure is suspected, submit a new sample in 2-4 weeks. Marland Kitchen   VITAMIN D 25 Hydroxy (Vit-D Deficiency, Fractures)     Status: Abnormal   Collection Time: 07/14/23  9:45 AM  Result Value Ref Range   Vit D, 25-Hydroxy 29 (L) 30 - 100 ng/mL    Comment: Vitamin D Status         25-OH Vitamin D: . Deficiency:                    <20 ng/mL Insufficiency:             20 - 29 ng/mL Optimal:                 > or = 30 ng/mL . For 25-OH Vitamin D testing on patients on  D2-supplementation and patients for whom quantitation  of D2 and D3 fractions is required, the QuestAssureD(TM) 25-OH VIT D, (D2,D3), LC/MS/MS is recommended: order  code 78295 (patients >23yrs). . See Note 1 . Note 1 . For additional information, please refer to   http://education.[facility].com/faq/FAQ199  (This link is being provided for informational/ educational purposes only.)   Iron, TIBC and Ferritin Panel     Status: None   Collection Time: 07/14/23  9:45 AM  Result Value Ref Range   Iron 65 40 - 190 mcg/dL   TIBC 621 308 - 657 mcg/dL (calc)   %SAT 20 16 - 45 % (calc)   Ferritin 22 16 - 232 ng/mL     PHQ2/9:    07/20/2023   10:16 AM 07/14/2023    9:09 AM 07/03/2023   11:36 AM 11/25/2022    3:29 PM 09/27/2022    1:46 PM  Depression screen PHQ 2/9  Decreased Interest 1 1 1 2 1   Down, Depressed, Hopeless 1 2 1 3 1   PHQ - 2 Score 2 3 2 5 2   Altered sleeping 1 2 1 2  0  Tired, decreased energy 3 3 1 2  0  Change in appetite 2 3 0 2 0  Feeling bad or failure about yourself  0 0 0 0 0  Trouble concentrating 1 1 0 0 0  Moving slowly or fidgety/restless 0 0 0 0 0  Suicidal thoughts 0 0 0 0 0  PHQ-9 Score 9 12 4 11 2   Difficult doing work/chores Not difficult at all Not difficult at all Not difficult at all Somewhat difficult     phq 9 is positive   Fall Risk:    07/20/2023   10:15 AM 07/14/2023    9:08 AM 07/03/2023   11:35 AM 11/25/2022    3:26 PM 09/27/2022    1:46 PM  Fall Risk   Falls in the past year? 0 0 0 0 0  Number falls in past yr:   0 0 0  Injury with Fall?   0 0 0  Risk for fall due to : No Fall Risks No Fall Risks   No Fall Risks  Follow up Falls prevention discussed  Falls prevention discussed;Education provided;Falls evaluation completed  Falls evaluation completed Falls prevention discussed     Assessment & Plan  1. Gastroesophageal reflux disease without esophagitis  - pantoprazole (PROTONIX) 40 MG tablet; Take 1 tablet (40 mg total) by mouth daily.  Dispense: 90 tablet; Refill: 1  2. Morbid obesity (HCC)  Discussed with the patient the risk posed by an increased BMI. Discussed importance of portion control, calorie counting and at least 150 minutes of physical activity weekly. Avoid sweet beverages  and drink more water. Eat at least 6 servings of fruit and vegetables daily   She will check with insurance to see if they cover weight loss medication   3. Pre-diabetes  A1C is trending up , needs to cut down on carbohydrates   4. Vitamin D deficiency  - Vitamin D, Ergocalciferol, (DRISDOL) 1.25 MG (50000 UNIT) CAPS capsule; Take 1 capsule (50,000 Units total) by mouth every 14 (fourteen) days.  Dispense: 6 capsule; Refill: 1  5. Dyslipidemia  Discussed healthy diet   6. Leukopenia, unspecified type  Recheck it next visit  7. Other insomnia  - hydrOXYzine (ATARAX) 10 MG tablet; Take 0.5 tablets (5 mg total) by mouth at bedtime.  Dispense: 45 tablet; Refill: 1  8. Fever blister  - valACYclovir (VALTREX) 500 MG tablet; Take 1 tablet (500 mg total) by mouth 2 (two) times daily.  Dispense: 10 tablet; Refill: 0  9. Major depression, recurrent, chronic (HCC)  - buPROPion (WELLBUTRIN XL) 150 MG 24 hr tablet; Take 1 tablet (150 mg total) by mouth daily. Patient has medication at home, do not dispense at this time  Dispense: 90 tablet; Refill: 0

## 2023-07-20 ENCOUNTER — Encounter: Payer: Self-pay | Admitting: Family Medicine

## 2023-07-20 ENCOUNTER — Ambulatory Visit: Payer: 59 | Admitting: Family Medicine

## 2023-07-20 VITALS — BP 116/78 | HR 80 | Temp 97.9°F | Resp 16 | Ht 66.0 in | Wt 234.9 lb

## 2023-07-20 DIAGNOSIS — E559 Vitamin D deficiency, unspecified: Secondary | ICD-10-CM

## 2023-07-20 DIAGNOSIS — D72819 Decreased white blood cell count, unspecified: Secondary | ICD-10-CM

## 2023-07-20 DIAGNOSIS — F339 Major depressive disorder, recurrent, unspecified: Secondary | ICD-10-CM

## 2023-07-20 DIAGNOSIS — B001 Herpesviral vesicular dermatitis: Secondary | ICD-10-CM

## 2023-07-20 DIAGNOSIS — G4709 Other insomnia: Secondary | ICD-10-CM

## 2023-07-20 DIAGNOSIS — Z1211 Encounter for screening for malignant neoplasm of colon: Secondary | ICD-10-CM

## 2023-07-20 DIAGNOSIS — R7303 Prediabetes: Secondary | ICD-10-CM | POA: Diagnosis not present

## 2023-07-20 DIAGNOSIS — K219 Gastro-esophageal reflux disease without esophagitis: Secondary | ICD-10-CM | POA: Diagnosis not present

## 2023-07-20 DIAGNOSIS — E785 Hyperlipidemia, unspecified: Secondary | ICD-10-CM

## 2023-07-20 MED ORDER — PANTOPRAZOLE SODIUM 40 MG PO TBEC
40.0000 mg | DELAYED_RELEASE_TABLET | Freq: Every day | ORAL | 1 refills | Status: DC
Start: 2023-07-20 — End: 2024-04-15

## 2023-07-20 MED ORDER — VITAMIN D (ERGOCALCIFEROL) 1.25 MG (50000 UNIT) PO CAPS: 50000.0000 [IU] | ORAL_CAPSULE | ORAL | 1 refills | Status: DC

## 2023-07-20 MED ORDER — HYDROXYZINE HCL 10 MG PO TABS: 5.0000 mg | ORAL_TABLET | Freq: Every evening | ORAL | 1 refills | Status: AC

## 2023-07-20 MED ORDER — VALACYCLOVIR HCL 500 MG PO TABS
500.0000 mg | ORAL_TABLET | Freq: Two times a day (BID) | ORAL | 0 refills | Status: DC
Start: 2023-07-20 — End: 2024-01-16

## 2023-07-20 MED ORDER — BUPROPION HCL ER (XL) 150 MG PO TB24: 150.0000 mg | ORAL_TABLET | Freq: Every day | ORAL | 0 refills | Status: DC

## 2023-08-01 ENCOUNTER — Other Ambulatory Visit: Payer: Self-pay

## 2023-08-01 ENCOUNTER — Telehealth: Payer: Self-pay | Admitting: Family Medicine

## 2023-08-01 DIAGNOSIS — Z1211 Encounter for screening for malignant neoplasm of colon: Secondary | ICD-10-CM

## 2023-08-01 NOTE — Telephone Encounter (Signed)
Spoke with Representative and relayed Z12.11 code to accompany well adult exam code they had on file.

## 2023-08-01 NOTE — Telephone Encounter (Unsigned)
Copied from CRM [id number]. Topic: General - Other >> Aug 01, 2023 11:41 AM Franchot Heidelberg wrote: Reason for CRM: Lynden Ang from [employer] called to report that the [employer] has not yet shipped bc of the ICD10 code selected. She is requesting to speak with someone that can assist. Please advise   Best contact: [phone] follow the prompts to provider support.   Case number: [id number]

## 2023-09-21 ENCOUNTER — Ambulatory Visit
Admission: RE | Admit: 2023-09-21 | Discharge: 2023-09-21 | Disposition: A | Discharge status: Home / Self Care | Payer: 59 | Source: Ambulatory Visit | Attending: Emergency Medicine | Admitting: Emergency Medicine

## 2023-09-21 VITALS — BP 139/94 | HR 80 | Temp 98.1°F | Resp 18

## 2023-09-21 DIAGNOSIS — H109 Unspecified conjunctivitis: Secondary | ICD-10-CM | POA: Diagnosis not present

## 2023-09-21 MED ORDER — MOXIFLOXACIN HCL 0.5 % OP SOLN: 1.0000 [drp] | Freq: Three times a day (TID) | OPHTHALMIC | 0 refills | Status: DC

## 2023-09-21 NOTE — Discharge Instructions (Addendum)
Today you being treated for bacterial conjunctivitis.   Place one drop of moxifloxacin into the effected eye every 8 hours while awake for 7 days. If the other eye starts to have symptoms you may use medication in it as well. Do not allow tip of dropper to touch eye.  May use cool compress for comfort and to remove discharge if present. Pat the eye, do not wipe.  Do not rub eyes, this may cause more irritation.  May use Claritin, Zyrtec or benadryl as needed to help if itching present.  If using allergy eyedrops, wait least 5 minutes between prescription eyedrops some medicines do not mix  Please avoid use of eye makeup until symptoms clear.  If symptoms persist after use of medication, please follow up at Urgent Care or with ophthalmologist (eye doctor)

## 2023-09-21 NOTE — ED Triage Notes (Signed)
Pt states she woke up today and having pain in her left eye with clear drainage. Took Claritin with no relief.

## 2023-09-21 NOTE — ED Provider Notes (Signed)
Emily Eaton    CSN: [id number] Arrival date & time: 09/21/23  1504      History   Chief Complaint Chief Complaint  Patient presents with   Eye Problem    Not sure if it's pink eye or not - Entered by patient    HPI Emily Eaton is a 47 y.o. female.   Patient presents for evaluation of left eye pain, pruritus, irritation and mild blurred vision beginning this morning upon awakening.  Dors is redness to the eye which has improved some throughout the day.  Has attempted use of Claritin and over-the-counter allergy eyedrops which have been minimally effective.  Symptoms persisting throughout the day, has begun to start to experience symptoms in the right eye as well.  Denies use of contacts but does wear glasses.  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  Past Medical History:  Diagnosis Date   Acute low back pain    ADD (attention deficit disorder) without hyperactivity    Anemia    Chronic constipation    COVID-19    Herpes simplex without complication    History of ovarian cyst 04/2010   Internal hemorrhoids    Iron deficiency anemia due to chronic blood loss    PMS (premenstrual syndrome)    Vitamin D deficiency     Patient Active Problem List   Diagnosis Date Noted   Major depression, recurrent, chronic (HCC) 11/25/2022   History of COVID-19 10/2019   GERD (gastroesophageal reflux disease) 09/11/2019   Pre-diabetes 07/25/2018   Dyslipidemia 07/25/2018   Mild major depression (HCC) 07/24/2018   Tension headache 07/24/2018   Stiffness of right hand joint 07/28/2015   Attention deficit hyperactivity disorder (ADHD), predominantly inattentive type 07/25/2015   Chronic constipation 07/25/2015   Herpes 07/25/2015   Hemorrhoids, internal 07/25/2015   Obesity (BMI 30-39.9) 07/25/2015   Vitamin D deficiency 07/25/2015   Radiculitis of right cervical region 04/14/2015   Epicondylitis elbow, medial 10/02/2013   Entrapment of ulnar nerve 10/02/2013    Impingement syndrome of shoulder 10/02/2013   Ovarian cyst 11/15/2011    Past Surgical History:  Procedure Laterality Date   ENDOMETRIAL BIOPSY     NOVASURE ABLATION     OVARIAN CYST REMOVAL     left   TUBAL LIGATION     WISDOM TOOTH EXTRACTION     x 1    OB History     Gravida  2   Para      Term      Preterm      AB      Living  2      SAB      IAB      Ectopic      Multiple      Live Births  2            Home Medications    Prior to Admission medications   Medication Sig Start Date End Date Taking? Authorizing Provider  Cholecalciferol (VITAMIN D) 50 MCG (2000 UT) CAPS Take 1 capsule (2,000 Units total) by mouth daily. 11/29/18  Yes Sowles, Danna Hefty, MD  hydrOXYzine (ATARAX) 10 MG tablet Take 0.5 tablets (5 mg total) by mouth at bedtime. 07/20/23  Yes Sowles, Danna Hefty, MD  moxifloxacin (VIGAMOX) 0.5 % ophthalmic solution Place 1 drop into both eyes 3 (three) times daily. 09/21/23  Yes Javonn Gauger R, NP  pantoprazole (PROTONIX) 40 MG tablet Take 1 tablet (40 mg total) by mouth daily. 07/20/23  Yes Alba Cory, MD  vitamin C (VITAMIN C) 500 MG tablet Take 1 tablet (500 mg total) by mouth daily. 11/18/19  Yes Delfino Lovett, MD  Vitamin D, Ergocalciferol, (DRISDOL) 1.25 MG (50000 UNIT) CAPS capsule Take 1 capsule (50,000 Units total) by mouth every 14 (fourteen) days. 07/20/23  Yes Sowles, Danna Hefty, MD  vitamin E 1000 UNIT capsule Take 1,000 Units by mouth daily.   Yes [provider]  buPROPion (WELLBUTRIN XL) 150 MG 24 hr tablet Take 1 tablet (150 mg total) by mouth daily. Patient has medication at home, do not dispense at this time 07/20/23   Alba Cory, MD  hydrocortisone (ANUSOL-HC) 25 MG suppository Place 1 suppository (25 mg total) rectally 2 (two) times daily. 07/03/23   Berniece Salines, FNP  metroNIDAZOLE (METROGEL) 0.75 % vaginal gel Place vaginally 2 (two) times a week. 06/12/23   Alba Cory, MD  norgestimate-ethinyl estradiol  (SPRINTEC 28) 0.25-35 MG-MCG tablet Take 1 tablet by mouth daily. 07/14/23   Alba Cory, MD  valACYclovir (VALTREX) 500 MG tablet Take 1 tablet (500 mg total) by mouth 2 (two) times daily. 07/20/23   Alba Cory, MD    Family History Family History  Problem Relation Age of Onset   Diabetes Mother    Depression Mother    Hypertension Mother    Kidney disease Mother    Hyperlipidemia Mother    Dementia Mother    Stroke Mother        March 18, 2018 (currently in rehab)    Hypertension Father    Diabetes Father    Liver disease Father    Prostate cancer Father    Cancer Maternal Uncle        prostate   Asthma Brother    Thrombocytopenia Son        ITP   ADD / ADHD Son    Breast cancer Neg Hx     Social History Social History   Tobacco Use   Smoking status: Never   Smokeless tobacco: Never  Vaping Use   Vaping status: Never Used  Substance Use Topics   Alcohol use: Yes    Alcohol/week: 2.0 standard drinks of alcohol    Types: 2 Standard drinks or equivalent per week    Comment: occassionally   Drug use: No     Allergies   Codeine   Review of Systems Review of Systems   Physical Exam Triage Vital Signs ED Triage Vitals  Encounter Vitals Group     BP 09/21/23 1525 (!) 139/94     Systolic BP Percentile --      Diastolic BP Percentile --      Pulse Rate 09/21/23 1525 80     Resp 09/21/23 1525 18     Temp 09/21/23 1525 98.1 F (36.7 C)     Temp Source 09/21/23 1525 Oral     SpO2 09/21/23 1525 98 %     Weight --      Height --      Head Circumference --      Peak Flow --      Pain Score 09/21/23 1527 6     Pain Loc --      Pain Education --      Exclude from Growth Chart --    No data found.  Updated Vital Signs BP (!) 139/94 (BP Location: Left Arm)   Pulse 80   Temp 98.1 F (36.7 C) (Oral)   Resp 18   LMP 08/10/2023 (Approximate)  SpO2 98%   Visual Acuity Right Eye Distance:   Left Eye Distance:   Bilateral Distance:    Right Eye  Near:   Left Eye Near:    Bilateral Near:     Physical Exam Constitutional:      Appearance: Normal appearance.  Eyes:     Comments: Mild redness to the left conjunctiva with mild periorbital swelling, no drainage noted on exam, vision grossly intact, extraocular movements intact  Neurological:     Mental Status: She is alert and oriented to person, place, and time. Mental status is at baseline.      UC Treatments / Results  Labs (all labs ordered are listed, but only abnormal results are displayed) Labs Reviewed - No data to display  EKG   Radiology No results found.  Procedures Procedures (including critical care time)  Medications Ordered in UC Medications - No data to display  Initial Impression / Assessment and Plan / UC Course  I have reviewed the triage vital signs and the nursing notes.  Pertinent labs & imaging results that were available during my care of the patient were reviewed by me and considered in my medical decision making (see chart for details).  Bacterial conjunctivitis of left eye  Presentation consistent with a conjunctivitis, not responding to allergy medications, progressively worsening, will provide coverage for bacteria, moxifloxacin sent to pharmacy, discussed administration, recommended use of oral antihistamines or eyedrops for management of pruritus, may continue use of cool to warm compresses for comfort, advised against direct eye touching or rubbing to prevent further contamination and spread, given strict precautions if no improvement seen to follow-up for reevaluation Final Clinical Impressions(s) / UC Diagnoses   Final diagnoses:  Bacterial conjunctivitis of left eye     Discharge Instructions      Today you being treated for bacterial conjunctivitis.   Place one drop of moxifloxacin into the effected eye every 8 hours while awake for 7 days. If the other eye starts to have symptoms you may use medication in it as well. Do not  allow tip of dropper to touch eye.  May use cool compress for comfort and to remove discharge if present. Pat the eye, do not wipe.  Do not rub eyes, this may cause more irritation.  May use Claritin, Zyrtec or benadryl as needed to help if itching present.  If using allergy eyedrops, wait least 5 minutes between prescription eyedrops some medicines do not mix  Please avoid use of eye makeup until symptoms clear.  If symptoms persist after use of medication, please follow up at Urgent Care or with ophthalmologist (eye doctor)    ED Prescriptions     Medication Sig Dispense Auth. Provider   moxifloxacin (VIGAMOX) 0.5 % ophthalmic solution Place 1 drop into both eyes 3 (three) times daily. 3 mL Valinda Hoar, NP      PDMP not reviewed this encounter.   Valinda Hoar, NP 09/21/23 [phone]

## 2023-09-25 ENCOUNTER — Encounter: Payer: Self-pay | Admitting: Family Medicine

## 2023-11-27 NOTE — Progress Notes (Signed)
Name: Emily Eaton   MRN: [id number]    DOB: 26-Jul-1976   Date:12/08/2023       Progress Note  Subjective  Chief Complaint  Chief Complaint  Patient presents with   Medical Management of Chronic Issues   Headache    Onset for 3 weeks, mainly in the afternoon    HPI  Discussed the use of AI scribe software for clinical note transcription with the patient, who gave verbal consent to proceed.  History of Present Illness   The patient, with a history of major depressive disorder, presented with new onset headaches and elevated blood pressure. The headaches, described as throbbing and intense, were localized to the right temporal area and were associated with nausea, light sensitivity, and noise sensitivity. The patient reported that these headaches were reminiscent of those experienced during a previous COVID-19 infection. The headaches typically occurred around 2 PM and were severe enough to warrant over-the-counter migraine medication.  The patient also reported symptoms suggestive of perimenopause, including hot flashes and night sweats, which were severe enough to disrupt sleep. The patient had stopped taking Sprintec due to nausea months ago and symptoms has been severe since  The patient's blood pressure was noted to be elevated at 140s, a new finding as the patient had no prior history of hypertension and was not on any antihypertensive medications. The patient's depression score was noted to be 6, an improvement from a score of PHQ9 from  12 in July. The patient had been grieving the loss of her mother, who passed away in 08-Nov-2023 of the previous year but is gradually getting better  The patient denied any new medications, weakness, slurred speech, or other unusual symptoms. The patient was not experiencing a headache at the time of the consultation.         Patient Active Problem List   Diagnosis Date Noted   Major depression, recurrent, chronic (HCC) 11/25/2022   History of  COVID-19 10/2019   GERD (gastroesophageal reflux disease) 09/11/2019   Pre-diabetes 07/25/2018   Dyslipidemia 07/25/2018   Mild major depression (HCC) 07/24/2018   Tension headache 07/24/2018   Stiffness of right hand joint 07/28/2015   Attention deficit hyperactivity disorder (ADHD), predominantly inattentive type 07/25/2015   Chronic constipation 07/25/2015   Herpes 07/25/2015   Hemorrhoids, internal 07/25/2015   Obesity (BMI 30-39.9) 07/25/2015   Vitamin D deficiency 07/25/2015   Radiculitis of right cervical region 04/14/2015   Epicondylitis elbow, medial 10/02/2013   Entrapment of ulnar nerve 10/02/2013   Impingement syndrome of shoulder 10/02/2013   Ovarian cyst 11/15/2011    Past Surgical History:  Procedure Laterality Date   ENDOMETRIAL BIOPSY     NOVASURE ABLATION     OVARIAN CYST REMOVAL     left   TUBAL LIGATION     WISDOM TOOTH EXTRACTION     x 1    Family History  Problem Relation Age of Onset   Diabetes Mother    Depression Mother    Hypertension Mother    Kidney disease Mother    Hyperlipidemia Mother    Dementia Mother    Stroke Mother        March 18, 2018 (currently in rehab)    Hypertension Father    Diabetes Father    Liver disease Father    Prostate cancer Father    Cancer Maternal Uncle        prostate   Asthma Brother    Thrombocytopenia Son  ITP   ADD / ADHD Son    Breast cancer Neg Hx     Social History   Tobacco Use   Smoking status: Never   Smokeless tobacco: Never  Substance Use Topics   Alcohol use: Yes    Alcohol/week: 2.0 standard drinks of alcohol    Types: 2 Standard drinks or equivalent per week    Comment: occassionally     Current Outpatient Medications:    buPROPion (WELLBUTRIN XL) 150 MG 24 hr tablet, Take 1 tablet (150 mg total) by mouth daily. Patient has medication at home, do not dispense at this time, Disp: 90 tablet, Rfl: 0   Cholecalciferol (VITAMIN D) 50 MCG (2000 UT) CAPS, Take 1 capsule (2,000  Units total) by mouth daily., Disp: 30 capsule, Rfl: 0   hydrocortisone (ANUSOL-HC) 25 MG suppository, Place 1 suppository (25 mg total) rectally 2 (two) times daily., Disp: 12 suppository, Rfl: 0   hydrOXYzine (ATARAX) 10 MG tablet, Take 0.5 tablets (5 mg total) by mouth at bedtime., Disp: 45 tablet, Rfl: 1   metroNIDAZOLE (METROGEL) 0.75 % vaginal gel, Place vaginally 2 (two) times a week., Disp: 70 g, Rfl: 2   moxifloxacin (VIGAMOX) 0.5 % ophthalmic solution, Place 1 drop into both eyes 3 (three) times daily., Disp: 3 mL, Rfl: 0   pantoprazole (PROTONIX) 40 MG tablet, Take 1 tablet (40 mg total) by mouth daily., Disp: 90 tablet, Rfl: 1   valACYclovir (VALTREX) 500 MG tablet, Take 1 tablet (500 mg total) by mouth 2 (two) times daily., Disp: 10 tablet, Rfl: 0   vitamin C (VITAMIN C) 500 MG tablet, Take 1 tablet (500 mg total) by mouth daily., Disp: 30 tablet, Rfl: 0   Vitamin D, Ergocalciferol, (DRISDOL) 1.25 MG (50000 UNIT) CAPS capsule, Take 1 capsule (50,000 Units total) by mouth every 14 (fourteen) days., Disp: 6 capsule, Rfl: 1   vitamin E 1000 UNIT capsule, Take 1,000 Units by mouth daily., Disp: , Rfl:   Allergies  Allergen Reactions   Codeine Itching and Rash    I personally reviewed active problem list, medication list, allergies, family history with the patient/caregiver today.   ROS  Ten systems reviewed and is negative except as mentioned in HPI    Objective  Vitals:   12/08/23 1447  BP: (!) 148/94  Pulse: 84  Resp: 16  Temp: 97.7 F (36.5 C)  TempSrc: Oral  Weight: 232 lb 14.4 oz (105.6 kg)  Height: 5\' 6"  (1.676 m)    Body mass index is 37.59 kg/m.  Physical Exam  Constitutional: Patient appears well-developed and well-nourished. Obese  No distress.  HEENT: head atraumatic, normocephalic, pupils equal and reactive to light, normal TM, neck supple, throat within normal limits Cardiovascular: Normal rate, regular rhythm and normal heart sounds.  No murmur  heard. No BLE edema. Pulmonary/Chest: Effort normal and breath sounds normal. No respiratory distress. Abdominal: Soft.  There is no tenderness. Neuro: no focal deficit  Psychiatric: Patient has a normal mood and affect. behavior is normal. Judgment and thought content normal.   PHQ2/9:    12/08/2023    2:46 PM 07/20/2023   10:16 AM 07/14/2023    9:09 AM 07/03/2023   11:36 AM 11/25/2022    3:29 PM  Depression screen PHQ 2/9  Decreased Interest 1 1 1 1 2   Down, Depressed, Hopeless 1 1 2 1 3   PHQ - 2 Score 2 2 3 2 5   Altered sleeping 1 1 2 1 2   Tired, decreased energy  1 3 3 1 2   Change in appetite 1 2 3  0 2  Feeling bad or failure about yourself  0 0 0 0 0  Trouble concentrating 1 1 1  0 0  Moving slowly or fidgety/restless 0 0 0 0 0  Suicidal thoughts 0 0 0 0 0  PHQ-9 Score 6 9 12 4 11   Difficult doing work/chores Somewhat difficult Not difficult at all Not difficult at all Not difficult at all Somewhat difficult    phq 9 is positive   Fall Risk:    12/08/2023    2:40 PM 07/20/2023   10:15 AM 07/14/2023    9:08 AM 07/03/2023   11:35 AM 11/25/2022    3:26 PM  Fall Risk   Falls in the past year? 0 0 0 0 0  Number falls in past yr: 0   0 0  Injury with Fall? 0   0 0  Risk for fall due to : No Fall Risks No Fall Risks No Fall Risks    Follow up Falls prevention discussed;Education provided;Falls evaluation completed Falls prevention discussed Falls prevention discussed;Education provided;Falls evaluation completed  Falls evaluation completed     Assessment & Plan     New Onset Migraines New onset of severe, unilateral, throbbing headaches with associated photophobia, phonophobia, and nausea. No prior history of migraines. Headaches are not daily and patient is not currently experiencing a headache. Discussed options of imaging vs trial of migraine medication since she does not recall a personal history of migraine as a teenager or young adult. Advised to ask family members if they  recall any complaints of severe headaches in the past  -Start Nurtec ODT for acute migraines as needed. -Start Metoprolol 25mg  daily for migraine prophylaxis and elevated blood pressure. - effexor being prescribed for perimenopausal symptoms but may also help prevent migraine -Check in 1 month to assess response to treatment.  Elevated Blood Pressure New onset of elevated blood pressure, possibly related to poor sleep due to menopausal symptoms. -Start Metoprolol 25mg  daily for elevated blood pressure and migraine prophylaxis. -Check in 1 month to assess response to treatment.  Perimenopausal Symptoms Reports of hot flashes and night sweats, possibly contributing to poor sleep and elevated blood pressure. -Start Effexor 37.5mg  daily, increasing to 75mg  daily after 1 week for hot flashes and depression. -Check in 1 month to assess response to treatment.  Depression Patient has a history of major depressive disorder, currently managed with Wellbutrin. PHQ-9 score has improved since last visit. -Continue Wellbutrin. -Start Effexor 37.5mg  daily, increasing to 75mg  daily after 1 week for depression and hot flashes. -Check in 1 month to assess response to treatment.  General Health Maintenance -Schedule mammogram for January 2025.

## 2023-12-08 ENCOUNTER — Ambulatory Visit (INDEPENDENT_AMBULATORY_CARE_PROVIDER_SITE_OTHER): Payer: 59 | Admitting: Family Medicine

## 2023-12-08 ENCOUNTER — Encounter: Payer: Self-pay | Admitting: Family Medicine

## 2023-12-08 VITALS — BP 142/94 | HR 84 | Temp 97.7°F | Resp 16 | Ht 66.0 in | Wt 232.9 lb

## 2023-12-08 DIAGNOSIS — Z1231 Encounter for screening mammogram for malignant neoplasm of breast: Secondary | ICD-10-CM

## 2023-12-08 DIAGNOSIS — R7303 Prediabetes: Secondary | ICD-10-CM

## 2023-12-08 DIAGNOSIS — N951 Menopausal and female climacteric states: Secondary | ICD-10-CM | POA: Diagnosis not present

## 2023-12-08 DIAGNOSIS — R61 Generalized hyperhidrosis: Secondary | ICD-10-CM

## 2023-12-08 DIAGNOSIS — G43009 Migraine without aura, not intractable, without status migrainosus: Secondary | ICD-10-CM | POA: Diagnosis not present

## 2023-12-08 DIAGNOSIS — F339 Major depressive disorder, recurrent, unspecified: Secondary | ICD-10-CM

## 2023-12-08 DIAGNOSIS — R030 Elevated blood-pressure reading, without diagnosis of hypertension: Secondary | ICD-10-CM

## 2023-12-08 MED ORDER — VENLAFAXINE HCL ER 37.5 MG PO CP24: 37.5000 mg | ORAL_CAPSULE | Freq: Every day | ORAL | 0 refills | Status: DC

## 2023-12-08 MED ORDER — METOPROLOL SUCCINATE ER 25 MG PO TB24
12.5000 mg | ORAL_TABLET | Freq: Every evening | ORAL | 0 refills | Status: DC
Start: 2023-12-08 — End: 2024-01-16

## 2023-12-08 MED ORDER — NURTEC 75 MG PO TBDP: 1.0000 | ORAL_TABLET | ORAL | 1 refills | Status: DC

## 2024-01-10 ENCOUNTER — Ambulatory Visit: Payer: 59 | Admitting: Family Medicine

## 2024-01-16 ENCOUNTER — Encounter: Payer: Self-pay | Admitting: Family Medicine

## 2024-01-16 ENCOUNTER — Ambulatory Visit (INDEPENDENT_AMBULATORY_CARE_PROVIDER_SITE_OTHER): Payer: 59 | Admitting: Family Medicine

## 2024-01-16 VITALS — BP 132/80 | HR 87 | Temp 98.0°F | Resp 16 | Ht 66.0 in | Wt 237.2 lb

## 2024-01-16 DIAGNOSIS — B001 Herpesviral vesicular dermatitis: Secondary | ICD-10-CM

## 2024-01-16 DIAGNOSIS — N951 Menopausal and female climacteric states: Secondary | ICD-10-CM | POA: Diagnosis not present

## 2024-01-16 DIAGNOSIS — G43009 Migraine without aura, not intractable, without status migrainosus: Secondary | ICD-10-CM

## 2024-01-16 DIAGNOSIS — K640 First degree hemorrhoids: Secondary | ICD-10-CM

## 2024-01-16 DIAGNOSIS — K219 Gastro-esophageal reflux disease without esophagitis: Secondary | ICD-10-CM

## 2024-01-16 DIAGNOSIS — R7303 Prediabetes: Secondary | ICD-10-CM

## 2024-01-16 MED ORDER — METOPROLOL SUCCINATE ER 25 MG PO TB24: 25.0000 mg | ORAL_TABLET | Freq: Every evening | ORAL | 0 refills | Status: DC

## 2024-01-16 MED ORDER — RIZATRIPTAN BENZOATE 10 MG PO TBDP: 10.0000 mg | ORAL_TABLET | ORAL | 0 refills | Status: DC | PRN

## 2024-01-16 MED ORDER — VALACYCLOVIR HCL 1 G PO TABS: 1000.0000 mg | ORAL_TABLET | Freq: Two times a day (BID) | ORAL | 0 refills | Status: DC

## 2024-01-16 MED ORDER — HYDROCORTISONE ACETATE 25 MG RE SUPP: 25.0000 mg | Freq: Two times a day (BID) | RECTAL | 0 refills | Status: DC

## 2024-01-16 NOTE — Progress Notes (Signed)
Name: Emily Eaton   MRN: [id number]    DOB: 1976/02/08   Date:01/16/2024       Progress Note  Subjective  Chief Complaint  Chief Complaint  Patient presents with   Medical Management of Chronic Issues    Pt stopped venlafaxine due to making her feel drowsy.   HPI    Perimenopausal symptoms and dysthymia: she stopped Effexor because it was causing drowsiness but states feeling better now   HTN: we gave her a rx of metoprolol last visit due to BP being high multiple times. She has been taking Metoprolol at night but every other night instead of half pill daily, advised her to take one full pill daily to get the bp under better control   Migraine headaches: she is doing better since started on Metoprolol , one episode in the past 5 weeks. The episodes she was having was throbbing, right side, associated with photophobia and phonophobia. She is happy with results. Her insurance did not cover Nurtec so unable to fill , she got some samples from a colleague and it worked well for her.    GERD: doing well, taking prn medication now  Major depression: recurrent, states still feels depressed, has problems sleeping at times but does not like taking medications and is learning to cope better with stress   Pre diabetes: she denies polyphagia, polydipsia or polyuria, being mindful of her diet    Hemorrhoids internal: had a recent flare, would like to have a refill of suppositories , she will contact GI for banding  Morbid obesity: she has been mindful about her diet, cooking more at home, eating more fruit and vegetables, however weight is up, she thinks due to holidays and is also a stress eater   Patient Active Problem List   Diagnosis Date Noted   Major depression, recurrent, chronic (HCC) 11/25/2022   History of COVID-19 10/2019   GERD (gastroesophageal reflux disease) 09/11/2019   Pre-diabetes 07/25/2018   Dyslipidemia 07/25/2018   Mild major depression (HCC) 07/24/2018   Tension  headache 07/24/2018   Stiffness of right hand joint 07/28/2015   Attention deficit hyperactivity disorder (ADHD), predominantly inattentive type 07/25/2015   Chronic constipation 07/25/2015   Herpes 07/25/2015   Hemorrhoids, internal 07/25/2015   Obesity (BMI 30-39.9) 07/25/2015   Vitamin D deficiency 07/25/2015   Radiculitis of right cervical region 04/14/2015   Epicondylitis elbow, medial 10/02/2013   Entrapment of ulnar nerve 10/02/2013   Impingement syndrome of shoulder 10/02/2013   Ovarian cyst 11/15/2011    Past Surgical History:  Procedure Laterality Date   ENDOMETRIAL BIOPSY     NOVASURE ABLATION     OVARIAN CYST REMOVAL     left   TUBAL LIGATION     WISDOM TOOTH EXTRACTION     x 1    Family History  Problem Relation Age of Onset   Diabetes Mother    Depression Mother    Hypertension Mother    Kidney disease Mother    Hyperlipidemia Mother    Dementia Mother    Stroke Mother        March 18, 2018 (currently in rehab)    Hypertension Father    Diabetes Father    Liver disease Father    Prostate cancer Father    Cancer Maternal Uncle        prostate   Asthma Brother    Thrombocytopenia Son        ITP   ADD / ADHD Son  Breast cancer Neg Hx     Social History   Tobacco Use   Smoking status: Never   Smokeless tobacco: Never  Substance Use Topics   Alcohol use: Yes    Alcohol/week: 2.0 standard drinks of alcohol    Types: 2 Standard drinks or equivalent per week    Comment: occassionally     Current Outpatient Medications:    buPROPion (WELLBUTRIN XL) 150 MG 24 hr tablet, Take 1 tablet (150 mg total) by mouth daily. Patient has medication at home, do not dispense at this time, Disp: 90 tablet, Rfl: 0   Cholecalciferol (VITAMIN D) 50 MCG (2000 UT) CAPS, Take 1 capsule (2,000 Units total) by mouth daily., Disp: 30 capsule, Rfl: 0   hydrocortisone (ANUSOL-HC) 25 MG suppository, Place 1 suppository (25 mg total) rectally 2 (two) times daily., Disp: 12  suppository, Rfl: 0   hydrOXYzine (ATARAX) 10 MG tablet, Take 0.5 tablets (5 mg total) by mouth at bedtime., Disp: 45 tablet, Rfl: 1   metoprolol succinate (TOPROL-XL) 25 MG 24 hr tablet, Take 0.5-1 tablets (12.5-25 mg total) by mouth every evening. Prevention migraine also elevated bp, Disp: 30 tablet, Rfl: 0   metroNIDAZOLE (METROGEL) 0.75 % vaginal gel, Place vaginally 2 (two) times a week., Disp: 70 g, Rfl: 2   moxifloxacin (VIGAMOX) 0.5 % ophthalmic solution, Place 1 drop into both eyes 3 (three) times daily., Disp: 3 mL, Rfl: 0   pantoprazole (PROTONIX) 40 MG tablet, Take 1 tablet (40 mg total) by mouth daily., Disp: 90 tablet, Rfl: 1   Rimegepant Sulfate (NURTEC) 75 MG TBDP, Take 1 tablet (75 mg total) by mouth every other day., Disp: 16 tablet, Rfl: 1   valACYclovir (VALTREX) 500 MG tablet, Take 1 tablet (500 mg total) by mouth 2 (two) times daily., Disp: 10 tablet, Rfl: 0   vitamin C (VITAMIN C) 500 MG tablet, Take 1 tablet (500 mg total) by mouth daily., Disp: 30 tablet, Rfl: 0   Vitamin D, Ergocalciferol, (DRISDOL) 1.25 MG (50000 UNIT) CAPS capsule, Take 1 capsule (50,000 Units total) by mouth every 14 (fourteen) days., Disp: 6 capsule, Rfl: 1   vitamin E 1000 UNIT capsule, Take 1,000 Units by mouth daily., Disp: , Rfl:    venlafaxine XR (EFFEXOR XR) 37.5 MG 24 hr capsule, Take 1-2 capsules (37.5-75 mg total) by mouth daily with breakfast. One for the first week after that 2 in am (Patient not taking: Reported on 01/16/2024), Disp: 60 capsule, Rfl: 0  Allergies  Allergen Reactions   Codeine Itching and Rash    I personally reviewed active problem list, medication list, allergies with the patient/caregiver today.   ROS  Ten systems reviewed and is negative except as mentioned in HPI    Objective  Vitals:   01/16/24 1506  BP: 132/80  Pulse: 87  Resp: 16  Temp: 98 F (36.7 C)  TempSrc: Oral  SpO2: 98%  Weight: 237 lb 3.2 oz (107.6 kg)  Height: 5\' 6"  (1.676 m)    Body  mass index is 38.29 kg/m.  Physical Exam  Constitutional: Patient appears well-developed and well-nourished. Obese  No distress.  HEENT: head atraumatic, normocephalic, pupils equal and reactive to light, , neck supple Cardiovascular: Normal rate, regular rhythm and normal heart sounds.  No murmur heard. No BLE edema. Pulmonary/Chest: Effort normal and breath sounds normal. No respiratory distress. Abdominal: Soft.  There is no tenderness. Psychiatric: Patient has a normal mood and affect. behavior is normal. Judgment and thought content normal.  Diabetic Foot Exam:     PHQ2/9:    01/16/2024    3:05 PM 12/08/2023    2:46 PM 07/20/2023   10:16 AM 07/14/2023    9:09 AM 07/03/2023   11:36 AM  Depression screen PHQ 2/9  Decreased Interest 0 1 1 1 1   Down, Depressed, Hopeless 0 1 1 2 1   PHQ - 2 Score 0 2 2 3 2   Altered sleeping 0 1 1 2 1   Tired, decreased energy 0 1 3 3 1   Change in appetite 0 1 2 3  0  Feeling bad or failure about yourself  0 0 0 0 0  Trouble concentrating 0 1 1 1  0  Moving slowly or fidgety/restless 0 0 0 0 0  Suicidal thoughts 0 0 0 0 0  PHQ-9 Score 0 6 9 12 4   Difficult doing work/chores Not difficult at all Somewhat difficult Not difficult at all Not difficult at all Not difficult at all    phq 9 is negative  Fall Risk:    01/16/2024    3:01 PM 12/08/2023    2:40 PM 07/20/2023   10:15 AM 07/14/2023    9:08 AM 07/03/2023   11:35 AM  Fall Risk   Falls in the past year? 0 0 0 0 0  Number falls in past yr: 0 0   0  Injury with Fall? 0 0   0  Risk for fall due to : No Fall Risks No Fall Risks No Fall Risks No Fall Risks   Follow up Falls prevention discussed;Education provided;Falls evaluation completed Falls prevention discussed;Education provided;Falls evaluation completed Falls prevention discussed Falls prevention discussed;Education provided;Falls evaluation completed      Assessment & Plan  1. Migraine without aura and without status migrainosus, not  intractable (Primary)  - rizatriptan (MAXALT-MLT) 10 MG disintegrating tablet; Take 1 tablet (10 mg total) by mouth as needed for migraine. May repeat in 2 hours if needed  Dispense: 10 tablet; Refill: 0 - metoprolol succinate (TOPROL-XL) 25 MG 24 hr tablet; Take 1 tablet (25 mg total) by mouth every evening. Prevention migraine also elevated bp  Dispense: 90 tablet; Refill: 0  2. Morbid obesity (HCC)  Discussed with the patient the risk posed by an increased BMI. Discussed importance of portion control, calorie counting and at least 150 minutes of physical activity weekly. Avoid sweet beverages and drink more water. Eat at least 6 servings of fruit and vegetables daily    3. Perimenopause  Improved   4. Grade I hemorrhoids  - hydrocortisone (ANUSOL-HC) 25 MG suppository; Place 1 suppository (25 mg total) rectally 2 (two) times daily.  Dispense: 12 suppository; Refill: 0  5. Fever blister  - valACYclovir (VALTREX) 1000 MG tablet; Take 1 tablet (1,000 mg total) by mouth 2 (two) times daily.  Dispense: 10 tablet; Refill: 0  6. Pre-diabetes  Discussed low carbohydrate diet  7. Gastroesophageal reflux disease without esophagitis   Doing well

## 2024-01-18 ENCOUNTER — Ambulatory Visit
Admission: RE | Admit: 2024-01-18 | Discharge: 2024-01-18 | Disposition: A | Discharge status: Home / Self Care | Payer: 59 | Source: Ambulatory Visit | Attending: Family Medicine | Admitting: Family Medicine

## 2024-01-18 DIAGNOSIS — Z0000 Encounter for general adult medical examination without abnormal findings: Secondary | ICD-10-CM | POA: Diagnosis present

## 2024-01-18 DIAGNOSIS — Z1231 Encounter for screening mammogram for malignant neoplasm of breast: Secondary | ICD-10-CM | POA: Diagnosis present

## 2024-04-15 ENCOUNTER — Encounter: Payer: Self-pay | Admitting: Family Medicine

## 2024-04-15 ENCOUNTER — Ambulatory Visit (INDEPENDENT_AMBULATORY_CARE_PROVIDER_SITE_OTHER): Payer: 59 | Admitting: Family Medicine

## 2024-04-15 DIAGNOSIS — K5904 Chronic idiopathic constipation: Secondary | ICD-10-CM

## 2024-04-15 DIAGNOSIS — I10 Essential (primary) hypertension: Secondary | ICD-10-CM | POA: Diagnosis not present

## 2024-04-15 DIAGNOSIS — G43009 Migraine without aura, not intractable, without status migrainosus: Secondary | ICD-10-CM | POA: Insufficient documentation

## 2024-04-15 DIAGNOSIS — E785 Hyperlipidemia, unspecified: Secondary | ICD-10-CM

## 2024-04-15 DIAGNOSIS — R7303 Prediabetes: Secondary | ICD-10-CM

## 2024-04-15 DIAGNOSIS — E559 Vitamin D deficiency, unspecified: Secondary | ICD-10-CM

## 2024-04-15 DIAGNOSIS — K219 Gastro-esophageal reflux disease without esophagitis: Secondary | ICD-10-CM

## 2024-04-15 DIAGNOSIS — N951 Menopausal and female climacteric states: Secondary | ICD-10-CM | POA: Insufficient documentation

## 2024-04-15 DIAGNOSIS — B001 Herpesviral vesicular dermatitis: Secondary | ICD-10-CM | POA: Insufficient documentation

## 2024-04-15 MED ORDER — METOPROLOL SUCCINATE ER 25 MG PO TB24: 25.0000 mg | ORAL_TABLET | Freq: Every evening | ORAL | 1 refills | Status: AC

## 2024-04-15 MED ORDER — RIZATRIPTAN BENZOATE 10 MG PO TBDP: 10.0000 mg | ORAL_TABLET | ORAL | 0 refills | Status: AC | PRN

## 2024-04-15 MED ORDER — PANTOPRAZOLE SODIUM 40 MG PO TBEC
40.0000 mg | DELAYED_RELEASE_TABLET | Freq: Every day | ORAL | 1 refills | Status: AC
Start: 2024-04-15 — End: ?

## 2024-04-15 MED ORDER — VALACYCLOVIR HCL 1 G PO TABS: 1000.0000 mg | ORAL_TABLET | Freq: Two times a day (BID) | ORAL | 0 refills | Status: DC

## 2024-04-15 MED ORDER — TOPIRAMATE 50 MG PO TABS: 50.0000 mg | ORAL_TABLET | Freq: Two times a day (BID) | ORAL | 0 refills | Status: DC

## 2024-04-15 MED ORDER — ONDANSETRON HCL 4 MG PO TABS: 4.0000 mg | ORAL_TABLET | Freq: Three times a day (TID) | ORAL | 0 refills | Status: AC | PRN

## 2024-04-15 NOTE — Progress Notes (Signed)
 Name: Emily Eaton   MRN: [id number]    DOB: 24-Jul-1976   Date:04/15/2024       Progress Note  Subjective  Chief Complaint  Chief Complaint  Patient presents with   Medical Management of Chronic Issues   Discussed the use of AI scribe software for clinical note transcription with the patient, who gave verbal consent to proceed.  History of Present Illness Emily Eaton is a 48 year old female who presents for a regular follow-up visit.  She has hypertension and is currently taking metoprolol . Her blood pressure at home has not been regularly monitored, but during a recent DOT physical, it was noted to be lower than usual. She denies experiencing chest pain or other concerning symptoms.  She experiences migraines approximately three times a month, each lasting two to three days. The migraines are characterized by throbbing pain on the right temporal side, sometimes accompanied by nausea and sensitivity to light and sound. She uses Maxalt  for acute relief, which provides partial alleviation. She previously tried Nurtec, but it was not covered by her insurance. She takes a beta blocker but not controlling symptoms sufficiently.   Her weight has increased by one pound since her last visit, now at 237 pounds with a BMI of 38.29. She has attempted dietary changes, including eliminating meat and increasing fruits, vegetables, and water intake, but has not found these measures effective for weight loss. She has tried intermittent fasting from midnight to noon and is considering adjusting her eating window to better fit her work schedule from 8 AM to 4 PM. She reports a lack of physical activity due to her busy lifestyle, acknowledging that she has not been active. She notes that she gets home around 4:30 PM, which could allow for exercise.  She has a history of internal hemorrhoids and uses Anusol   inserts with good effect. She reports constipation issues and has been using Benefiber in her oatmeal  but has not used Miralax  recently. She acknowledges the need for increased water intake and physical activity to help with bowel movements.  She has not had a menstrual cycle since September 2024, indicating she is in the perimenopausal phase. She experiences typical symptoms such as hot flashes and night sweats.  She takes over-the-counter vitamin D  for a deficiency and has prediabetes, which she is managing with diet. She also has reflux, for which she takes pantoprazole  as needed and manages with dietary modifications.    Patient Active Problem List   Diagnosis Date Noted   Major depression, recurrent, chronic (HCC) 11/25/2022   History of COVID-19 10/2019   GERD (gastroesophageal reflux disease) 09/11/2019   Pre-diabetes 07/25/2018   Dyslipidemia 07/25/2018   Tension headache 07/24/2018   Stiffness of right hand joint 07/28/2015   Attention deficit hyperactivity disorder (ADHD), predominantly inattentive type 07/25/2015   Chronic constipation 07/25/2015   Herpes 07/25/2015   Hemorrhoids, internal 07/25/2015   Obesity (BMI 30-39.9) 07/25/2015   Vitamin D  deficiency 07/25/2015   Radiculitis of right cervical region 04/14/2015   Epicondylitis elbow, medial 10/02/2013   Entrapment of ulnar nerve 10/02/2013   Impingement syndrome of shoulder 10/02/2013   Ovarian cyst 11/15/2011    Past Surgical History:  Procedure Laterality Date   ENDOMETRIAL BIOPSY     NOVASURE ABLATION     OVARIAN CYST REMOVAL     left   TUBAL LIGATION     WISDOM TOOTH EXTRACTION     x 1    Family History  Problem Relation  Age of Onset   Diabetes Mother    Depression Mother    Hypertension Mother    Kidney disease Mother    Hyperlipidemia Mother    Dementia Mother    Stroke Mother        March 18, 2018 (currently in rehab)    Hypertension Father    Diabetes Father    Liver disease Father    Prostate cancer Father    Cancer Maternal Uncle        prostate   Asthma Brother    Thrombocytopenia  Son        ITP   ADD / ADHD Son    Breast cancer Neg Hx     Social History   Tobacco Use   Smoking status: Never   Smokeless tobacco: Never  Substance Use Topics   Alcohol use: Yes    Alcohol/week: 2.0 standard drinks of alcohol    Types: 2 Standard drinks or equivalent per week    Comment: occassionally     Current Outpatient Medications:    Cholecalciferol (VITAMIN D ) 50 MCG (2000 UT) CAPS, Take 1 capsule (2,000 Units total) by mouth daily., Disp: 30 capsule, Rfl: 0   hydrocortisone  (ANUSOL -HC) 25 MG suppository, Place 1 suppository (25 mg total) rectally 2 (two) times daily., Disp: 12 suppository, Rfl: 0   hydrOXYzine  (ATARAX ) 10 MG tablet, Take 0.5 tablets (5 mg total) by mouth at bedtime., Disp: 45 tablet, Rfl: 1   metoprolol  succinate (TOPROL -XL) 25 MG 24 hr tablet, Take 1 tablet (25 mg total) by mouth every evening. Prevention migraine also elevated bp, Disp: 90 tablet, Rfl: 0   metroNIDAZOLE  (METROGEL ) 0.75 % vaginal gel, Place vaginally 2 (two) times a week., Disp: 70 g, Rfl: 2   moxifloxacin  (VIGAMOX ) 0.5 % ophthalmic solution, Place 1 drop into both eyes 3 (three) times daily., Disp: 3 mL, Rfl: 0   pantoprazole  (PROTONIX ) 40 MG tablet, Take 1 tablet (40 mg total) by mouth daily., Disp: 90 tablet, Rfl: 1   rizatriptan  (MAXALT -MLT) 10 MG disintegrating tablet, Take 1 tablet (10 mg total) by mouth as needed for migraine. May repeat in 2 hours if needed, Disp: 10 tablet, Rfl: 0   valACYclovir  (VALTREX ) 1000 MG tablet, Take 1 tablet (1,000 mg total) by mouth 2 (two) times daily., Disp: 10 tablet, Rfl: 0   vitamin C  (VITAMIN C ) 500 MG tablet, Take 1 tablet (500 mg total) by mouth daily., Disp: 30 tablet, Rfl: 0   Vitamin D , Ergocalciferol , (DRISDOL ) 1.25 MG (50000 UNIT) CAPS capsule, Take 1 capsule (50,000 Units total) by mouth every 14 (fourteen) days., Disp: 6 capsule, Rfl: 1   vitamin E 1000 UNIT capsule, Take 1,000 Units by mouth daily., Disp: , Rfl:   Allergies  Allergen  Reactions   Codeine Itching and Rash    I personally reviewed active problem list, medication list, allergies, family history with the patient/caregiver today.   ROS  Ten systems reviewed and is negative except as mentioned in HPI    Objective Physical Exam VITALS: P- 70, BP- 134/82 MEASUREMENTS: Weight- 237, BMI- 38.0. CONSTITUTIONAL: Patient appears well-developed and well-nourished. No distress. HEENT: Head atraumatic, normocephalic, neck supple. CARDIOVASCULAR: Normal rate, regular rhythm and normal heart sounds. No murmur heard. No edema in lower extremities. PULMONARY: Effort normal and breath sounds normal. Lungs clear to auscultation. No respiratory distress. ABDOMINAL: There is no tenderness or distention. MUSCULOSKELETAL: Normal gait. Without gross motor or sensory deficit. PSYCHIATRIC: Patient has a normal mood and affect. Behavior is  normal. Judgment and thought content normal.  Vitals:   04/15/24 1525  BP: 134/82  Pulse: 73  Resp: 16  SpO2: 94%  Weight: 238 lb 1.6 oz (108 kg)  Height: 5\' 6"  (1.676 m)    Body mass index is 38.43 kg/m.   PHQ2/9:    04/15/2024    3:21 PM 01/16/2024    3:05 PM 12/08/2023    2:46 PM 07/20/2023   10:16 AM 07/14/2023    9:09 AM  Depression screen PHQ 2/9  Decreased Interest 0 0 1 1 1   Down, Depressed, Hopeless 0 0 1 1 2   PHQ - 2 Score 0 0 2 2 3   Altered sleeping 0 0 1 1 2   Tired, decreased energy 0 0 1 3 3   Change in appetite 0 0 1 2 3   Feeling bad or failure about yourself  0 0 0 0 0  Trouble concentrating 0 0 1 1 1   Moving slowly or fidgety/restless 0 0 0 0 0  Suicidal thoughts 0 0 0 0 0  PHQ-9 Score 0 0 6 9 12   Difficult doing work/chores Not difficult at all Not difficult at all Somewhat difficult Not difficult at all Not difficult at all    phq 9 is negative  Fall Risk:    01/16/2024    3:01 PM 12/08/2023    2:40 PM 07/20/2023   10:15 AM 07/14/2023    9:08 AM 07/03/2023   11:35 AM  Fall Risk   Falls in the past  year? 0 0 0 0 0  Number falls in past yr: 0 0   0  Injury with Fall? 0 0   0  Risk for fall due to : No Fall Risks No Fall Risks No Fall Risks No Fall Risks   Follow up Falls prevention discussed;Education provided;Falls evaluation completed Falls prevention discussed;Education provided;Falls evaluation completed Falls prevention discussed Falls prevention discussed;Education provided;Falls evaluation completed       Assessment & Plan Migraine Experiencing migraines three times monthly, lasting 2-3 days. Maxalt  effective but requires timing. Topamax considered for prevention with potential weight loss benefits. Discussed Topamax side effects. - Prescribe Topamax, start 50 mg, titrate to 100 mg twice daily. - Continue Maxalt  as needed, max two doses in 24 hours. - Provide Zofran  for nausea. - Metoprolol  for prevention - Keep headache log to monitor frequency and response.  Hypertension Hypertension well-controlled with metoprolol . Clinic BP 134/82 mmHg. Passed DOT physical. No chest pain. No home BP monitoring. - Continue metoprolol , 24-month supply. - Encourage home blood pressure monitoring.  Obesity morbid  BMI 38.29, weight increased by 1 pound. Previous diet ineffective. Discussed protein intake and risks of high carbohydrate diets. Recommended intermittent fasting and physical activity. - Encourage balanced diet with adequate protein. - Recommend intermittent fasting with 6-hour eating window. - Advise increasing physical activity, suggest exercise after work.  Prediabetes Managed with diet, no medication. Emphasized weight loss for management. - Continue dietary management. - Monitor weight, consider medication if necessary.  Gastroesophageal reflux disease (GERD) Managed with pantoprazole  as needed. Aware of and avoids dietary triggers. - Continue pantoprazole  as needed. - Advise dietary modifications to avoid triggers. - Avoid eating before bedtime.  Constipation  chronic idiopathic  Constipation possibly related to dietary changes. Benefiber used, Miralax  not recently. Emphasized water intake and physical activity. - Add Miralax  to regimen. - Ensure adequate water intake. - Increase physical activity.  Internal hemorrhoids Internal hemorrhoids, no bleeding. Constipation management crucial to prevent recurrence. - Ensure soft  stools with fiber and Miralax . - Maintain adequate hydration and physical activity.  Perimenopausal symptoms Perimenopausal symptoms present. No menstrual cycle since September 2024. Not yet menopausal. - Monitor symptoms, provide support as needed.

## 2024-06-18 ENCOUNTER — Telehealth: Payer: Self-pay

## 2024-06-18 ENCOUNTER — Other Ambulatory Visit: Payer: Self-pay

## 2024-06-18 DIAGNOSIS — Z1211 Encounter for screening for malignant neoplasm of colon: Secondary | ICD-10-CM

## 2024-06-18 NOTE — Telephone Encounter (Signed)
 No answer from pt left detailed vm, ordered new order for colonoscopy to [facility]- [phone].

## 2024-06-18 NOTE — Telephone Encounter (Signed)
 Copied from CRM [id number]. Topic: General - Other >> Jun 18, 2024 10:18 AM Emylou G wrote: Reason for CRM: Pls call patient looking to do colonoscopy

## 2024-06-19 ENCOUNTER — Telehealth: Payer: Self-pay

## 2024-06-19 ENCOUNTER — Other Ambulatory Visit: Payer: Self-pay

## 2024-06-19 DIAGNOSIS — Z1211 Encounter for screening for malignant neoplasm of colon: Secondary | ICD-10-CM

## 2024-06-19 MED ORDER — NA SULFATE-K SULFATE-MG SULF 17.5-3.13-1.6 GM/177ML PO SOLN: 1.0000 | Freq: Once | ORAL | 0 refills | Status: AC

## 2024-06-19 NOTE — Telephone Encounter (Signed)
 The patient called back to speak to [place].

## 2024-06-19 NOTE — Telephone Encounter (Signed)
 Gastroenterology Pre-Procedure Review  Request Date: 09/11/24 Requesting Physician: Dr. Lawanna  PATIENT REVIEW QUESTIONS: The patient responded to the following health history questions as indicated:    1. Are you having any GI issues? no 2. Do you have a personal history of Polyps? Over 20 years ago 3. Do you have a family history of Colon Cancer or Polyps? no 4. Diabetes Mellitus? no 5. Joint replacements in the past 12 months?no 6. Major health problems in the past 3 months?no 7. Any artificial heart valves, MVP, or defibrillator?no    MEDICATIONS & ALLERGIES:    Patient reports the following regarding taking any anticoagulation/antiplatelet therapy:   Plavix, Coumadin, Eliquis, Xarelto, Lovenox , Pradaxa, Brilinta, or Effient? no Aspirin? no  Patient confirms/reports the following medications:  Current Outpatient Medications  Medication Sig Dispense Refill   Na Sulfate-K Sulfate-Mg Sulfate concentrate (SUPREP) 17.5-3.13-1.6 GM/177ML SOLN Take 1 kit (354 mLs total) by mouth once for 1 dose. 354 mL 0   Cholecalciferol (VITAMIN D ) 50 MCG (2000 UT) CAPS Take 1 capsule (2,000 Units total) by mouth daily. 30 capsule 0   hydrOXYzine  (ATARAX ) 10 MG tablet Take 0.5 tablets (5 mg total) by mouth at bedtime. 45 tablet 1   metoprolol  succinate (TOPROL -XL) 25 MG 24 hr tablet Take 1 tablet (25 mg total) by mouth every evening. Prevention migraine also elevated bp 90 tablet 1   metroNIDAZOLE  (METROGEL ) 0.75 % vaginal gel Place vaginally 2 (two) times a week. 70 g 2   ondansetron  (ZOFRAN ) 4 MG tablet Take 1 tablet (4 mg total) by mouth every 8 (eight) hours as needed for nausea or vomiting. 12 tablet 0   pantoprazole  (PROTONIX ) 40 MG tablet Take 1 tablet (40 mg total) by mouth daily. 90 tablet 1   rizatriptan  (MAXALT -MLT) 10 MG disintegrating tablet Take 1 tablet (10 mg total) by mouth as needed for migraine. May repeat in 2 hours if needed 10 tablet 0   topiramate  (TOPAMAX ) 50 MG tablet Take 1-2  tablets (50-100 mg total) by mouth 2 (two) times daily. 120 tablet 0   valACYclovir  (VALTREX ) 1000 MG tablet Take 1 tablet (1,000 mg total) by mouth 2 (two) times daily. 10 tablet 0   vitamin C  (VITAMIN C ) 500 MG tablet Take 1 tablet (500 mg total) by mouth daily. 30 tablet 0   vitamin E 1000 UNIT capsule Take 1,000 Units by mouth daily.     No current facility-administered medications for this visit.    Patient confirms/reports the following allergies:  Allergies  Allergen Reactions   Codeine Itching and Rash    No orders of the defined types were placed in this encounter.   AUTHORIZATION INFORMATION Primary Insurance: 1D#: Group #:  Secondary Insurance: 1D#: Group #:  SCHEDULE INFORMATION: Date: 09/11/24 Time: Location: [facility]

## 2024-07-15 ENCOUNTER — Other Ambulatory Visit (HOSPITAL_COMMUNITY)
Admission: RE | Admit: 2024-07-15 | Discharge: 2024-07-15 | Disposition: A | Discharge status: Home / Self Care | Source: Ambulatory Visit | Attending: Family Medicine | Admitting: Family Medicine

## 2024-07-15 ENCOUNTER — Encounter: Payer: Self-pay | Admitting: Family Medicine

## 2024-07-15 ENCOUNTER — Ambulatory Visit (INDEPENDENT_AMBULATORY_CARE_PROVIDER_SITE_OTHER): Admitting: Family Medicine

## 2024-07-15 DIAGNOSIS — E785 Hyperlipidemia, unspecified: Secondary | ICD-10-CM | POA: Diagnosis not present

## 2024-07-15 DIAGNOSIS — R7303 Prediabetes: Secondary | ICD-10-CM

## 2024-07-15 DIAGNOSIS — I10 Essential (primary) hypertension: Secondary | ICD-10-CM | POA: Diagnosis not present

## 2024-07-15 DIAGNOSIS — Z113 Encounter for screening for infections with a predominantly sexual mode of transmission: Secondary | ICD-10-CM | POA: Insufficient documentation

## 2024-07-15 DIAGNOSIS — G43009 Migraine without aura, not intractable, without status migrainosus: Secondary | ICD-10-CM | POA: Diagnosis not present

## 2024-07-15 DIAGNOSIS — K219 Gastro-esophageal reflux disease without esophagitis: Secondary | ICD-10-CM

## 2024-07-15 DIAGNOSIS — E559 Vitamin D deficiency, unspecified: Secondary | ICD-10-CM

## 2024-07-15 DIAGNOSIS — N760 Acute vaginitis: Secondary | ICD-10-CM

## 2024-07-15 DIAGNOSIS — B001 Herpesviral vesicular dermatitis: Secondary | ICD-10-CM

## 2024-07-15 DIAGNOSIS — B9689 Other specified bacterial agents as the cause of diseases classified elsewhere: Secondary | ICD-10-CM

## 2024-07-15 MED ORDER — VALACYCLOVIR HCL 1 G PO TABS: 1000.0000 mg | ORAL_TABLET | Freq: Two times a day (BID) | ORAL | 1 refills | Status: AC

## 2024-07-15 MED ORDER — METRONIDAZOLE 0.75 % VA GEL: VAGINAL | 2 refills | Status: AC

## 2024-07-15 MED ORDER — ZEPBOUND 2.5 MG/0.5ML ~~LOC~~ SOAJ
2.5000 mg | SUBCUTANEOUS | 0 refills | Status: AC
Start: 2024-07-15 — End: ?

## 2024-07-15 NOTE — Patient Instructions (Signed)
 Healthy Weight Loss Guide ?? Weight Loss Goal - Aim for 1-2 pounds per week - Target: 5-10% of your starting body weight over 3-6 months ??? Nutrition Tips - Eat 3 meals per day and avoid skipping meals - Fill half your plate with vegetables, a quarter with protein, a quarter with whole grains - Choose lean proteins: chicken, fish, eggs, tofu, beans - Limit: - Sugary drinks (soda, sweet tea, juice) - Fried foods and fast food - Processed snacks (chips, candy, cookies) - Drink at least 64 oz of water per day - Practice portion control and mindful eating ???? Lifestyle Habits - Track what you eat (apps like MyFitnessPal, Lose It!, or a paper log) - Get 7-9 hours of sleep per night - Manage stress (meditation, breathing exercises, counseling if needed) - Limit alcohol (empty calories and may increase hunger) ???? Exercise Recommendations - Goal: 150 minutes per week of moderate activity (e.g., brisk walking, cycling) - Start with 10-15 minutes/day and build up gradually - Add 2 days per week of strength training (light weights, resistance bands, or bodyweight) ?? Remember: Progress > Perfection Small changes every day add up. Don't give up! - Avoid high-fat or greasy foods to reduce nausea - Focus on protein at each meal to preserve muscle mass - Stay well hydrated (at least 64 oz water per day) - Limit sugar and processed carbohydrates ?? Managing Side Effects if on weight loss medication - Eat slowly and stop eating when you feel full - Use anti-nausea strategies: ginger tea, peppermint, crackers - Talk to your provider about adjusting the dose if needed - Stool softeners or fiber supplements can help with constipation ?? Staying on Track - Track weight and non-scale victories (energy, clothing fit, labs) - Follow up with your provider regularly - Don't stop medication without medical guidance - Combine medication with healthy habits for best results ?? Remember Weight loss  medications are a tool, not a shortcut. Healthy habits matter. Be patient and consistent--small changes lead to big results.

## 2024-07-15 NOTE — Progress Notes (Signed)
 Name: Emily Eaton   MRN: [id number]    DOB: May 25, 1976   Date:07/15/2024       Progress Note  Subjective  Chief Complaint  Chief Complaint  Patient presents with   Medical Management of Chronic Issues   Discussed the use of AI scribe software for clinical note transcription with the patient, who gave verbal consent to proceed.  History of Present Illness Emily Eaton is a 48 year old female with obesity, hypertension, dyslipidemia, and prediabetes who presents for weight management and follow-up.  She has been actively attempting weight loss through increased physical activity and dietary changes. She walks approximately five days a week for 30 to 45 minutes, primarily in the evenings due to heat. She practices intermittent fasting, typically not eating from 8 PM until 9 or 10 AM the next day. She has eliminated sodas from her diet and now drinks water mixed with fresh fruits and coconut water. Despite these efforts, she feels her weight remains a concern, noting that she has not been this size even during pregnancy.  She has a history of hypertension and is currently taking metoprolol  25 mg with no reported side effects. No chest pain or palpitations are noted.  She has a history of dyslipidemia, and her LDL was 144 mg/dL on lab work from July of the previous year.  The 10-year ASCVD risk score (Arnett DK, et al., 2019) is: 3.8%   Values used to calculate the score:     Age: 66 years     Clincally relevant sex: Female     Is Non-Hispanic African American: Yes     Diabetic: No     Tobacco smoker: No     Systolic Blood Pressure: 130 mmHg     Is BP treated: Yes     HDL Cholesterol: 53 mg/dL     Total Cholesterol: 212 mg/dL   She has prediabetes and metabolic syndrome, with a previous A1c of 6.0%. No symptoms of uncontrolled diabetes such as excessive hunger, thirst, or frequent urination are reported.  She experiences reflux and heartburn but manages it by avoiding certain  foods and taking medication as needed.  She has a history of fever blisters and takes Valtrex  as needed, typically at the onset of symptoms. Stress is noted as a potential trigger, but no recent outbreaks have occurred.  She was previously prescribed Topamax  for migraines but has not experienced migraines recently and therefore has not been taking the medication. She reports light sensitivity during migraines and uses prescription sunglasses to manage this. She takes Maxalt  prn and zofran  prn migraine/nausea but still has medication at home   She takes hydroxyzine  for sleep but reports that her sleep has been adequate with the use of metoprolol , which helps her heart rate decrease at night.    Patient Active Problem List   Diagnosis Date Noted   Fever blister 04/15/2024   Perimenopause 04/15/2024   Hypertension, benign 04/15/2024   Migraine without aura and without status migrainosus, not intractable 04/15/2024   Morbid obesity (HCC) 04/15/2024   Major depression, recurrent, chronic (HCC) 11/25/2022   History of COVID-19 10/2019   GERD (gastroesophageal reflux disease) 09/11/2019   Prediabetes 07/25/2018   Dyslipidemia 07/25/2018   Tension headache 07/24/2018   Stiffness of right hand joint 07/28/2015   Attention deficit hyperactivity disorder (ADHD), predominantly inattentive type 07/25/2015   Chronic idiopathic constipation 07/25/2015   Herpes 07/25/2015   Hemorrhoids, internal 07/25/2015   Obesity (BMI 30-39.9) 07/25/2015   Vitamin  D deficiency 07/25/2015   Radiculitis of right cervical region 04/14/2015   Epicondylitis elbow, medial 10/02/2013   Entrapment of ulnar nerve 10/02/2013   Impingement syndrome of shoulder 10/02/2013   Ovarian cyst 11/15/2011    Past Surgical History:  Procedure Laterality Date   ENDOMETRIAL BIOPSY     NOVASURE ABLATION     OVARIAN CYST REMOVAL     left   TUBAL LIGATION     WISDOM TOOTH EXTRACTION     x 1    Family History  Problem  Relation Age of Onset   Diabetes Mother    Depression Mother    Hypertension Mother    Kidney disease Mother    Hyperlipidemia Mother    Dementia Mother    Stroke Mother        March 18, 2018 (currently in rehab)    Hypertension Father    Diabetes Father    Liver disease Father    Prostate cancer Father    Cancer Maternal Uncle        prostate   Asthma Brother    Thrombocytopenia Son        ITP   ADD / ADHD Son    Breast cancer Neg Hx     Social History   Tobacco Use   Smoking status: Never   Smokeless tobacco: Never  Substance Use Topics   Alcohol use: Yes    Alcohol/week: 2.0 standard drinks of alcohol    Types: 2 Standard drinks or equivalent per week    Comment: occassionally     Current Outpatient Medications:    Cholecalciferol (VITAMIN D ) 50 MCG (2000 UT) CAPS, Take 1 capsule (2,000 Units total) by mouth daily., Disp: 30 capsule, Rfl: 0   hydrOXYzine  (ATARAX ) 10 MG tablet, Take 0.5 tablets (5 mg total) by mouth at bedtime., Disp: 45 tablet, Rfl: 1   metoprolol  succinate (TOPROL -XL) 25 MG 24 hr tablet, Take 1 tablet (25 mg total) by mouth every evening. Prevention migraine also elevated bp, Disp: 90 tablet, Rfl: 1   ondansetron  (ZOFRAN ) 4 MG tablet, Take 1 tablet (4 mg total) by mouth every 8 (eight) hours as needed for nausea or vomiting., Disp: 12 tablet, Rfl: 0   pantoprazole  (PROTONIX ) 40 MG tablet, Take 1 tablet (40 mg total) by mouth daily., Disp: 90 tablet, Rfl: 1   rizatriptan  (MAXALT -MLT) 10 MG disintegrating tablet, Take 1 tablet (10 mg total) by mouth as needed for migraine. May repeat in 2 hours if needed, Disp: 10 tablet, Rfl: 0   tirzepatide  (ZEPBOUND ) 2.5 MG/0.5ML Pen, Inject 2.5 mg into the skin once a week., Disp: 2 mL, Rfl: 0   vitamin C  (VITAMIN C ) 500 MG tablet, Take 1 tablet (500 mg total) by mouth daily., Disp: 30 tablet, Rfl: 0   vitamin E 1000 UNIT capsule, Take 1,000 Units by mouth daily., Disp: , Rfl:    metroNIDAZOLE  (METROGEL ) 0.75 %  vaginal gel, Place vaginally 2 (two) times a week., Disp: 70 g, Rfl: 2   valACYclovir  (VALTREX ) 1000 MG tablet, Take 1 tablet (1,000 mg total) by mouth 2 (two) times daily., Disp: 10 tablet, Rfl: 1  Allergies  Allergen Reactions   Codeine Itching and Rash    I personally reviewed active problem list, medication list, allergies, family history with the patient/caregiver today.   ROS  Ten systems reviewed and is negative except as mentioned in HPI    Objective Physical Exam  CONSTITUTIONAL: Patient appears well-developed and well-nourished. No distress. HEENT: Head atraumatic,  normocephalic, neck supple. CARDIOVASCULAR: Normal rate, regular rhythm and normal heart sounds. No murmur heard. No BLE edema. PULMONARY: Effort normal and breath sounds normal. Lungs clear to auscultation bilaterally. No respiratory distress. MUSCULOSKELETAL: Normal gait. Without gross motor or sensory deficit. PSYCHIATRIC: Patient has a normal mood and affect. Behavior is normal. Judgment and thought content normal.  Vitals:   07/15/24 0949  BP: 130/78  Pulse: 67  Resp: 16  SpO2: 97%  Weight: 237 lb 11.2 oz (107.8 kg)  Height: 5' 6 (1.676 m)    Body mass index is 38.37 kg/m.    PHQ2/9:    07/15/2024    9:53 AM 04/15/2024    3:21 PM 01/16/2024    3:05 PM 12/08/2023    2:46 PM 07/20/2023   10:16 AM  Depression screen PHQ 2/9  Decreased Interest 0 0 0 1 1  Down, Depressed, Hopeless 0 0 0 1 1  PHQ - 2 Score 0 0 0 2 2  Altered sleeping 0 0 0 1 1  Tired, decreased energy 0 0 0 1 3  Change in appetite 3 0 0 1 2  Feeling bad or failure about yourself  0 0 0 0 0  Trouble concentrating 0 0 0 1 1  Moving slowly or fidgety/restless 0 0 0 0 0  Suicidal thoughts 0 0 0 0 0  PHQ-9 Score 3 0 0 6 9  Difficult doing work/chores  Not difficult at all Not difficult at all Somewhat difficult Not difficult at all    phq 9 is negative  Fall Risk:    01/16/2024    3:01 PM 12/08/2023    2:40 PM 07/20/2023    10:15 AM 07/14/2023    9:08 AM 07/03/2023   11:35 AM  Fall Risk   Falls in the past year? 0 0 0 0 0  Number falls in past yr: 0 0   0  Injury with Fall? 0 0   0  Risk for fall due to : No Fall Risks No Fall Risks No Fall Risks No Fall Risks   Follow up Falls prevention discussed;Education provided;Falls evaluation completed Falls prevention discussed;Education provided;Falls evaluation completed Falls prevention discussed Falls prevention discussed;Education provided;Falls evaluation completed      Assessment & Plan Morbid Obesity Obesity with BMI 37.45. Comorbidities include dyslipidemia, hypertension, and GERD. Lifestyle modifications have resulted in some weight loss. - Initiate Zepbound  with prior authorization. - Educated on reducing food intake by 50% to prevent nausea. - Advised on gradual dose escalation of Zepbound . - Discussed potential side effects of Zepbound , including nausea if the stomach is stretched. - Encourage continued lifestyle modifications including portion control, increased vegetable intake, and regular physical activity.  Hypertension Hypertension well-controlled with metoprolol  25 mg. No side effects reported. - Continue metoprolol  25 mg.  Dyslipidemia Dyslipidemia with previous LDL 144 mg/dL. Lab work due for reassessment. - Order lipid panel to reassess dyslipidemia.  Prediabetes Prediabetes with previous A1c 6.5%. Lab work due for reassessment. - Order lab work to reassess A1c and glucose levels.  Gastroesophageal reflux disease (GERD) GERD managed with dietary modifications and medication as needed. - Continue dietary modifications. - Use medication as needed for symptom control.  Herpes labialis Herpes labialis managed with Valtrex . No recent outbreaks. - Prescribe Valtrex  for episodic treatment.  Bacterial vaginosis History of bacterial vaginosis. Request for metronidazole  for future episodes. - Prescribe metronidazole  for potential future  episodes.  STI screen -HIV, RPR, vaginal swab   Vitamin D  deficiency Recheck labs, continue  supplementation

## 2024-07-16 ENCOUNTER — Encounter: Payer: Self-pay | Admitting: Family Medicine

## 2024-07-16 ENCOUNTER — Ambulatory Visit: Payer: Self-pay | Admitting: Family Medicine

## 2024-07-16 LAB — LIPID PANEL
Cholesterol: 185 mg/dL (ref <200)
HDL: 51 mg/dL (ref >=50)
LDL Cholesterol (Calc): 119 mg/dL — ABNORMAL HIGH
Non-HDL Cholesterol (Calc): 134 mg/dL — ABNORMAL HIGH (ref <130)
Total CHOL/HDL Ratio: 3.6 (calc) (ref <5.0)
Triglycerides: 55 mg/dL (ref <150)

## 2024-07-16 LAB — CBC WITH DIFFERENTIAL/PLATELET
Absolute Lymphocytes: 1426 {cells}/uL (ref 850–3900)
Absolute Monocytes: 346 {cells}/uL (ref 200–950)
Basophils Absolute: 11 {cells}/uL (ref 0–200)
Basophils Relative: 0.3 %
Eosinophils Absolute: 90 {cells}/uL (ref 15–500)
Eosinophils Relative: 2.5 %
HCT: 40.4 % (ref 35.0–45.0)
Hemoglobin: 12.4 g/dL (ref 11.7–15.5)
MCH: 25.1 pg — ABNORMAL LOW (ref 27.0–33.0)
MCHC: 30.7 g/dL — ABNORMAL LOW (ref 32.0–36.0)
MCV: 81.8 fL (ref 80.0–100.0)
MPV: 9.5 fL (ref 7.5–12.5)
Monocytes Relative: 9.6 %
Neutro Abs: 1728 {cells}/uL (ref 1500–7800)
Neutrophils Relative %: 48 %
Platelets: 252 Thousand/uL (ref 140–400)
RBC: 4.94 Million/uL (ref 3.80–5.10)
RDW: 13.6 % (ref 11.0–15.0)
Total Lymphocyte: 39.6 %
WBC: 3.6 Thousand/uL — ABNORMAL LOW (ref 3.8–10.8)

## 2024-07-16 LAB — HIV ANTIBODY (ROUTINE TESTING W REFLEX): HIV 1&2 Ab, 4th Generation: NONREACTIVE

## 2024-07-16 LAB — COMPREHENSIVE METABOLIC PANEL WITH GFR
AG Ratio: 1.2 (calc) (ref 1.0–2.5)
ALT: 7 U/L (ref 6–29)
AST: 10 U/L (ref 10–35)
Albumin: 3.9 g/dL (ref 3.6–5.1)
Alkaline phosphatase (APISO): 70 U/L (ref 31–125)
BUN: 7 mg/dL (ref 7–25)
CO2: 26 mmol/L (ref 20–32)
Calcium: 9.2 mg/dL (ref 8.6–10.2)
Chloride: 104 mmol/L (ref 98–110)
Creat: 0.63 mg/dL (ref 0.50–0.99)
Globulin: 3.2 g/dL (ref 1.9–3.7)
Glucose, Bld: 91 mg/dL (ref 65–99)
Potassium: 4.7 mmol/L (ref 3.5–5.3)
Sodium: 138 mmol/L (ref 135–146)
Total Bilirubin: 0.4 mg/dL (ref 0.2–1.2)
Total Protein: 7.1 g/dL (ref 6.1–8.1)
eGFR: 109 mL/min/1.73m2 (ref >=60)

## 2024-07-16 LAB — CERVICOVAGINAL ANCILLARY ONLY
Bacterial Vaginitis (gardnerella): POSITIVE — AB
Candida Glabrata: NEGATIVE
Candida Vaginitis: POSITIVE — AB
Chlamydia: NEGATIVE
Comment: NEGATIVE
Comment: NEGATIVE
Comment: NEGATIVE
Comment: NEGATIVE
Comment: NEGATIVE
Comment: NORMAL
Neisseria Gonorrhea: NEGATIVE
Trichomonas: NEGATIVE

## 2024-07-16 LAB — HEMOGLOBIN A1C
Hgb A1c MFr Bld: 6 % — ABNORMAL HIGH (ref <5.7)
Mean Plasma Glucose: 126 mg/dL
eAG (mmol/L): 7 mmol/L

## 2024-07-16 LAB — RPR: RPR Ser Ql: NONREACTIVE

## 2024-07-16 LAB — VITAMIN D 25 HYDROXY (VIT D DEFICIENCY, FRACTURES): Vit D, 25-Hydroxy: 31 ng/mL (ref 30–100)

## 2024-07-17 ENCOUNTER — Other Ambulatory Visit: Payer: Self-pay | Admitting: Family Medicine

## 2024-07-17 MED ORDER — FLUCONAZOLE 150 MG PO TABS: 150.0000 mg | ORAL_TABLET | ORAL | 0 refills | Status: AC

## 2024-07-26 ENCOUNTER — Telehealth: Payer: Self-pay | Admitting: Pharmacy Technician

## 2024-07-26 ENCOUNTER — Other Ambulatory Visit (HOSPITAL_COMMUNITY): Payer: Self-pay

## 2024-07-26 NOTE — Telephone Encounter (Signed)
 Pharmacy Patient Advocate Encounter   Received notification from Onbase that prior authorization for Zepbound  2.5 mg/0.67ml is required/requested.   Insurance verification completed.   The patient is insured through [facility] .   Per test claim: Product/Service Not Covered - Plan/Benefit Exclusion The only exception is for OSA and then we would have to send in a sleep study resulting in patient's AHI of 15 or greater

## 2024-09-03 ENCOUNTER — Telehealth: Payer: Self-pay

## 2024-09-04 ENCOUNTER — Telehealth: Payer: Self-pay

## 2024-09-04 NOTE — Telephone Encounter (Signed)
 Pt called to cancel upcoming procedure will cal back to reschedule

## 2024-09-05 NOTE — Telephone Encounter (Signed)
 Returned phone call to patient this morning to reschedule her 09/11/24 Colonoscopy with Dr. Marinda.  LVM informing her that Dr. Marinda has 1 spot available on 09/18/24 and if she would like to rescheduled to this date to call me asap to let me know.  Thanks,  [place], CMA

## 2024-09-09 NOTE — Telephone Encounter (Signed)
 LVM to let patient know that her procedure has been canceled for 09/11/24.  Thanks,  [place], CMA

## 2024-09-11 ENCOUNTER — Ambulatory Visit: Admission: RE | Admit: 2024-09-11 | Source: Home / Self Care | Admitting: General Surgery

## 2024-09-11 ENCOUNTER — Encounter: Admission: RE | Payer: Self-pay | Source: Home / Self Care

## 2024-09-11 SURGERY — COLONOSCOPY
Anesthesia: General

## 2024-12-23 ENCOUNTER — Ambulatory Visit: Payer: Self-pay

## 2024-12-23 NOTE — Telephone Encounter (Signed)
 Patient calling back in, states she missed a call. No appts available at [facility] or regional offices until 12/25/24. No further notes in chart to determine when office can see her. Called [facility], they are offering next available to the patient which is 12/25/24. Patient states she will go to urgent care.

## 2024-12-23 NOTE — Telephone Encounter (Signed)
 FYI Only or Action Required?: Action required by provider: request for appointment.  Patient was last seen in primary care on 07/15/2024 by Glenard Mire, MD.  Called Nurse Triage reporting Otalgia.  Symptoms began several days ago.  Interventions attempted: OTC medications: tylenol , ibuprofen .  Symptoms are: stable.  Triage Disposition: See HCP Within 4 Hours (Or PCP Triage)  Patient/caregiver understands and will follow disposition?: No, wishes to speak with PCP  Copied from CRM #[id number]. Topic: Clinical - Red Word Triage >> Dec 23, 2024 12:41 PM [name] wrote: Red Word that prompted transfer to Nurse Triage: Patient thinks a q-tip punctured her ear drum. She had blood coming from her left ear.  Would like an appt with anyone at [facility] Reason for Disposition  [1] Cotton swab (e.g., Q-tip) inserted with force AND [2] pain persists > 30 minutes  Answer Assessment - Initial Assessment Questions No appointments today. Told patient to go to UC. She says all the UCs are full with flu patients. Requesting appointment with office. No appointment with [facility] but other practice had opening 1/6 but unable to schedule due to dispo. 1. MECHANISM: How did the injury happen?      Had an accident and pushed qtip into ear and thinks punctured her eardrum 2. ONSET: When did the injury happen? (e.g., minutes, hours ago)      2 days ago 3. LOCATION: What part of the ear is injured? Which ear is injured?     Left ear 5. HEARING: Was the hearing damaged?      Muffled, tinitus 7. PAIN: Is it painful? If Yes, ask: How bad is the pain? (e.g., Scale 0-10; none, mild, moderate, severe)     5 8. TETANUS: For any breaks in the skin, ask: When was your last tetanus booster?     Doesn't know date but knows it isn't time for it yet. 9. OTHER SYMPTOMS: Do you have any other symptoms? (e.g., neck pain, headache, loss of consciousness)     Ear feels stuffed up, muffled,  tinitus 10. PREGNANCY: Is there any chance you are pregnant? When was your last menstrual period?       Denies  Protocols used: Ear Injury-A-AH

## 2024-12-23 NOTE — Telephone Encounter (Signed)
 [facility] is full. Pt needs an appt

## 2024-12-24 ENCOUNTER — Ambulatory Visit
Admission: RE | Admit: 2024-12-24 | Discharge: 2024-12-24 | Disposition: A | Discharge status: Home / Self Care | Attending: Emergency Medicine | Admitting: Emergency Medicine

## 2024-12-24 VITALS — BP 126/84 | HR 74 | Temp 98.0°F | Resp 19

## 2024-12-24 DIAGNOSIS — S0922XA Traumatic rupture of left ear drum, initial encounter: Secondary | ICD-10-CM | POA: Diagnosis not present

## 2024-12-24 MED ORDER — AMOXICILLIN-POT CLAVULANATE 875-125 MG PO TABS: 1.0000 | ORAL_TABLET | Freq: Two times a day (BID) | ORAL | 0 refills | Status: AC

## 2024-12-24 NOTE — ED Triage Notes (Signed)
 Patient to Urgent Care with complaints of a left sided ear injury. Reports that she bumped a q-tip that was inside of her ear with her arm. Saw some bloody drainage.   Symptoms started Saturday. Still experiencing 4/10 ear pain/ muffled hearing.

## 2024-12-24 NOTE — ED Provider Notes (Addendum)
 " Emily Eaton    CSN: [id number] Arrival date & time: 12/24/24  1027      History   Chief Complaint Chief Complaint  Patient presents with   Ear Injury    I accidentally jabbed a qtip in my left ear. My doctor wasn't able to see me so she referred to me an urgent care - Entered by patient    HPI Emily Eaton is a 49 y.o. female.  Patient presents with ear pain and muffled hearing x 3 days.  Her symptoms started after she accidentally poked her ear with a Q-tip.  She was cleaning out her ear after showering when her bath towel started to drop.  When she reached down to grab the towel, she accidentally bumped the Q-tip and knocked it further into her ear.  She had immediate pain and some bloody drainage on the Q-tip.  Since the injury, she has had discomfort and muffled hearing.  No ear drainage since the initial injury.  No fever, sore throat, cough, shortness of breath.  No OTC medication today.  The history is provided by the patient and medical records.    Past Medical History:  Diagnosis Date   Acute low back pain    ADD (attention deficit disorder) without hyperactivity    Anemia    Chronic constipation    COVID-19    Herpes simplex without complication    History of ovarian cyst 04/2010   Internal hemorrhoids    Iron deficiency anemia due to chronic blood loss    PMS (premenstrual syndrome)    Vitamin D  deficiency     Patient Active Problem List   Diagnosis Date Noted   Fever blister 04/15/2024   Perimenopause 04/15/2024   Hypertension, benign 04/15/2024   Migraine without aura and without status migrainosus, not intractable 04/15/2024   Morbid obesity (HCC) 04/15/2024   Major depression, recurrent, chronic 11/25/2022   History of COVID-19 10/2019   GERD (gastroesophageal reflux disease) 09/11/2019   Prediabetes 07/25/2018   Dyslipidemia 07/25/2018   Tension headache 07/24/2018   Stiffness of right hand joint 07/28/2015   Attention deficit  hyperactivity disorder (ADHD), predominantly inattentive type 07/25/2015   Chronic idiopathic constipation 07/25/2015   Herpes 07/25/2015   Hemorrhoids, internal 07/25/2015   Obesity (BMI 30-39.9) 07/25/2015   Vitamin D  deficiency 07/25/2015   Radiculitis of right cervical region 04/14/2015   Epicondylitis elbow, medial 10/02/2013   Entrapment of ulnar nerve 10/02/2013   Impingement syndrome of shoulder 10/02/2013   Ovarian cyst 11/15/2011    Past Surgical History:  Procedure Laterality Date   ENDOMETRIAL BIOPSY     NOVASURE ABLATION     OVARIAN CYST REMOVAL     left   TUBAL LIGATION     WISDOM TOOTH EXTRACTION     x 1    OB History     Gravida  2   Para      Term      Preterm      AB      Living  2      SAB      IAB      Ectopic      Multiple      Live Births  2            Home Medications    Prior to Admission medications  Medication Sig Start Date End Date Taking? Authorizing Provider  amoxicillin -clavulanate (AUGMENTIN ) 875-125 MG tablet Take 1 tablet by mouth every 12 (  twelve) hours. 12/24/24  Yes Corlis Burnard DEL, NP  Cholecalciferol (VITAMIN D ) 50 MCG (2000 UT) CAPS Take 1 capsule (2,000 Units total) by mouth daily. 11/29/18   Sowles, Krichna, MD  fluconazole  (DIFLUCAN ) 150 MG tablet Take 1 tablet (150 mg total) by mouth every other day. 07/17/24   Sowles, Krichna, MD  hydrOXYzine  (ATARAX ) 10 MG tablet Take 0.5 tablets (5 mg total) by mouth at bedtime. 07/20/23   Sowles, Krichna, MD  metoprolol  succinate (TOPROL -XL) 25 MG 24 hr tablet Take 1 tablet (25 mg total) by mouth every evening. Prevention migraine also elevated bp 04/15/24   Sowles, Krichna, MD  metroNIDAZOLE  (METROGEL ) 0.75 % vaginal gel Place vaginally 2 (two) times a week. 07/15/24   Sowles, Krichna, MD  ondansetron  (ZOFRAN ) 4 MG tablet Take 1 tablet (4 mg total) by mouth every 8 (eight) hours as needed for nausea or vomiting. 04/15/24   Sowles, Krichna, MD  pantoprazole  (PROTONIX ) 40 MG  tablet Take 1 tablet (40 mg total) by mouth daily. 04/15/24   Sowles, Krichna, MD  rizatriptan  (MAXALT -MLT) 10 MG disintegrating tablet Take 1 tablet (10 mg total) by mouth as needed for migraine. May repeat in 2 hours if needed 04/15/24   Sowles, Krichna, MD  tirzepatide  (ZEPBOUND ) 2.5 MG/0.5ML Pen Inject 2.5 mg into the skin once a week. 07/15/24   Sowles, Krichna, MD  valACYclovir  (VALTREX ) 1000 MG tablet Take 1 tablet (1,000 mg total) by mouth 2 (two) times daily. 07/15/24   Sowles, Krichna, MD  vitamin C  (VITAMIN C ) 500 MG tablet Take 1 tablet (500 mg total) by mouth daily. 11/18/19   Maree Hue, MD  vitamin E 1000 UNIT capsule Take 1,000 Units by mouth daily.    [provider]    Family History Family History  Problem Relation Age of Onset   Diabetes Mother    Depression Mother    Hypertension Mother    Kidney disease Mother    Hyperlipidemia Mother    Dementia Mother    Stroke Mother        March 18, 2018 (currently in rehab)    Hypertension Father    Diabetes Father    Liver disease Father    Prostate cancer Father    Cancer Maternal Uncle        prostate   Asthma Brother    Thrombocytopenia Son        ITP   ADD / ADHD Son    Breast cancer Neg Hx     Social History Social History[1]   Allergies   Codeine   Review of Systems Review of Systems  Constitutional:  Negative for chills and fever.  HENT:  Positive for ear pain and hearing loss. Negative for ear discharge and sore throat.   Respiratory:  Negative for cough and shortness of breath.      Physical Exam Triage Vital Signs ED Triage Vitals [12/24/24 1052]  Encounter Vitals Group     BP 126/84     Girls Systolic BP Percentile      Girls Diastolic BP Percentile      Boys Systolic BP Percentile      Boys Diastolic BP Percentile      Pulse Rate 74     Resp 19     Temp 98 F (36.7 C)     Temp src      SpO2 98 %     Weight      Height      Head Circumference  Peak Flow      Pain Score  4     Pain Loc      Pain Education      Exclude from Growth Chart    No data found.  Updated Vital Signs BP 126/84   Pulse 74   Temp 98 F (36.7 C)   Resp 19   SpO2 98%   Visual Acuity Right Eye Distance:   Left Eye Distance:   Bilateral Distance:    Right Eye Near:   Left Eye Near:    Bilateral Near:     Physical Exam Constitutional:      General: She is not in acute distress. HENT:     Right Ear: Tympanic membrane and ear canal normal.     Left Ear: Tympanic membrane is perforated and erythematous.     Ears:     Comments: Left TM perforated with scant dried blood in canal.    Nose: Nose normal.     Mouth/Throat:     Mouth: Mucous membranes are moist.     Pharynx: Oropharynx is clear.  Cardiovascular:     Rate and Rhythm: Normal rate and regular rhythm.     Heart sounds: Normal heart sounds.  Pulmonary:     Effort: Pulmonary effort is normal. No respiratory distress.     Breath sounds: Normal breath sounds.  Neurological:     Mental Status: She is alert.      UC Treatments / Results  Labs (all labs ordered are listed, but only abnormal results are displayed) Labs Reviewed - No data to display  EKG   Radiology No results found.  Procedures Procedures (including critical care time)  Medications Ordered in UC Medications - No data to display  Initial Impression / Assessment and Plan / UC Course  I have reviewed the triage vital signs and the nursing notes.  Pertinent labs & imaging results that were available during my care of the patient were reviewed by me and considered in my medical decision making (see chart for details).    Traumatic rupture of left tympanic membrane.  Afebrile and vital signs are stable.  Treating today with Augmentin .  Precautions for ruptured TM discussed with patient.  Education provided on ruptured TM.  Instructed her to follow-up with ENT this afternoon to schedule the soonest available appointment.  Contact information  for [facility] provided.  ED precautions given.  She agrees to plan of care.  Final Clinical Impressions(s) / UC Diagnoses   Final diagnoses:  Traumatic rupture of tympanic membrane, left, initial encounter     Discharge Instructions      Take the Augmentin  as directed.  Call  ENT or the ENT of your choice to schedule the soonest available appointment to be seen this week.  Go to the emergency department if you have worsening symptoms.     ED Prescriptions     Medication Sig Dispense Auth. Provider   amoxicillin -clavulanate (AUGMENTIN ) 875-125 MG tablet Take 1 tablet by mouth every 12 (twelve) hours. 14 tablet Corlis Burnard DEL, NP      PDMP not reviewed this encounter.    Corlis Burnard DEL, NP 12/24/24 1120     [1]  Social History Tobacco Use   Smoking status: Never   Smokeless tobacco: Never  Vaping Use   Vaping status: Never Used  Substance Use Topics   Alcohol use: Yes    Alcohol/week: 2.0 standard drinks of alcohol    Types: 2 Standard drinks or equivalent  per week    Comment: occassionally   Drug use: No     Corlis Burnard DEL, NP 12/24/24 1121  "

## 2024-12-24 NOTE — Discharge Instructions (Addendum)
 Take the Augmentin  as directed.  Call [facility] or the ENT of your choice to schedule the soonest available appointment to be seen this week.  Go to the emergency department if you have worsening symptoms.
